# Patient Record
Sex: Male | Born: 1965
Health system: Southern US, Community
[De-identification: ages and names within clinical notes are randomized; demographics above are authoritative.]

## PROBLEM LIST (undated history)

## (undated) DIAGNOSIS — G8929 Other chronic pain: Secondary | ICD-10-CM

## (undated) DIAGNOSIS — K219 Gastro-esophageal reflux disease without esophagitis: Secondary | ICD-10-CM

## (undated) DIAGNOSIS — M549 Dorsalgia, unspecified: Secondary | ICD-10-CM

---

## 2002-04-17 ENCOUNTER — Ambulatory Visit (HOSPITAL_COMMUNITY): Admission: RE | Admit: 2002-04-17 | Discharge: 2002-04-17 | Payer: Self-pay | Admitting: General Surgery

## 2003-04-11 HISTORY — PX: ESOPHAGOGASTRODUODENOSCOPY: SHX1529

## 2012-09-08 ENCOUNTER — Encounter (HOSPITAL_COMMUNITY): Payer: Self-pay | Admitting: Emergency Medicine

## 2012-09-08 ENCOUNTER — Emergency Department (HOSPITAL_COMMUNITY)
Admission: EM | Admit: 2012-09-08 | Discharge: 2012-09-08 | Disposition: A | Discharge status: Home / Self Care | Payer: BC Managed Care – PPO | Source: Home / Self Care

## 2012-09-08 DIAGNOSIS — J309 Allergic rhinitis, unspecified: Secondary | ICD-10-CM

## 2012-09-08 DIAGNOSIS — M722 Plantar fascial fibromatosis: Secondary | ICD-10-CM

## 2012-09-08 HISTORY — DX: Other chronic pain: G89.29

## 2012-09-08 HISTORY — DX: Gastro-esophageal reflux disease without esophagitis: K21.9

## 2012-09-08 HISTORY — DX: Dorsalgia, unspecified: M54.9

## 2012-09-08 MED ORDER — METHYLPREDNISOLONE 4 MG PO KIT: PACK | ORAL | Status: DC

## 2012-09-08 NOTE — ED Notes (Signed)
Multiple complaints: primary complaint is bilateral foot pain that started approximately one month ago.  Reports pain in joints.  No new activity/work/shoes. Also, patient concerned for congestion.  Head and chest congestion for one week.

## 2012-09-08 NOTE — ED Provider Notes (Signed)
History     CSN: [id number]  Arrival date & time 09/08/12  1621   First MD Initiated Contact with Patient 09/08/12 1658      Chief Complaint  Patient presents with  . Foot Pain  . Nasal Congestion    (Consider location/radiation/quality/duration/timing/severity/associated sxs/prior treatment) HPI Comments: 47 year old employee of [employer] states he works 12 hours a day. For the past month he has been having pain in the plantar aspect of his feet. Also complaining of nasal congestion. Denies fever or chills. Denies any known injury.   Past Medical History  Diagnosis Date  . Acid reflux   . Chronic back pain     History reviewed. No pertinent past surgical history.  No family history on file.  History  Substance Use Topics  . Smoking status: Never Smoker   . Smokeless tobacco: Not on file  . Alcohol Use: Yes      Review of Systems  Constitutional: Negative.   HENT: Positive for congestion and postnasal drip. Negative for sore throat.   Eyes: Negative.   Respiratory: Negative.   Cardiovascular: Positive for chest pain.  Gastrointestinal: Negative.   Skin: Negative.   Neurological: Negative.     Allergies  Review of patient's allergies indicates no known allergies.  Home Medications   Current Outpatient Rx  Name  Route  Sig  Dispense  Refill  . methylPREDNISolone (MEDROL DOSEPAK) 4 MG tablet      follow package directions   21 tablet   0     BP 124/88  Pulse 101  Temp(Src) 98.8 F (37.1 C) (Oral)  Resp 19  SpO2 94%  Physical Exam  Nursing note and vitals reviewed. Constitutional: He is oriented to person, place, and time. He appears well-developed and well-nourished. No distress.  Neck: Normal range of motion. Neck supple.  Cardiovascular: Normal rate.   Pulmonary/Chest: Effort normal.  Musculoskeletal: Normal range of motion. He exhibits tenderness. He exhibits no edema.  Bilateral feet exam reveals normal architecture. No swelling,  deformity, or discoloration. Tenderness to the plantar aspect of each foot and heel and forefoot at the primary weight bearing areas.  Neurological: He is alert and oriented to person, place, and time. He exhibits normal muscle tone.  Skin: Skin is warm and dry.  Psychiatric: He has a normal mood and affect.    ED Course  Procedures (including critical care time)  Labs Reviewed - No data to display No results found.   1. Plantar fasciitis, bilateral   2. Allergic rhinitis due to allergen       MDM  Medrol dose pack Full length arch supports Comfortable shoes Roll feet over a cold can, cold compresses often Follow with the podiatrist if not improving in 1-2 weeks oBTAIN otc aLLEGRA OR cLARITIN  As directed        Hayden Rasmussen, NP 09/08/12 1824

## 2012-09-10 NOTE — ED Provider Notes (Signed)
Medical screening examination/treatment/procedure(s) were performed by resident physician or non-physician practitioner and as supervising physician I was immediately available for consultation/collaboration.   Barkley Bruns MD.   Linna Hoff, MD 09/10/12 1352

## 2013-10-26 ENCOUNTER — Emergency Department (HOSPITAL_COMMUNITY)
Admission: EM | Admit: 2013-10-26 | Discharge: 2013-10-26 | Disposition: A | Discharge status: Home / Self Care | Payer: BC Managed Care – PPO | Attending: Emergency Medicine | Admitting: Emergency Medicine

## 2013-10-26 ENCOUNTER — Emergency Department (HOSPITAL_COMMUNITY): Payer: BC Managed Care – PPO

## 2013-10-26 ENCOUNTER — Encounter (HOSPITAL_COMMUNITY): Payer: Self-pay | Admitting: Emergency Medicine

## 2013-10-26 DIAGNOSIS — Z79899 Other long term (current) drug therapy: Secondary | ICD-10-CM | POA: Insufficient documentation

## 2013-10-26 DIAGNOSIS — G8929 Other chronic pain: Secondary | ICD-10-CM | POA: Insufficient documentation

## 2013-10-26 DIAGNOSIS — R000 Tachycardia, unspecified: Secondary | ICD-10-CM | POA: Insufficient documentation

## 2013-10-26 DIAGNOSIS — K5712 Diverticulitis of small intestine without perforation or abscess without bleeding: Secondary | ICD-10-CM | POA: Insufficient documentation

## 2013-10-26 DIAGNOSIS — M549 Dorsalgia, unspecified: Secondary | ICD-10-CM | POA: Insufficient documentation

## 2013-10-26 DIAGNOSIS — K219 Gastro-esophageal reflux disease without esophagitis: Secondary | ICD-10-CM | POA: Insufficient documentation

## 2013-10-26 LAB — URINALYSIS, ROUTINE W REFLEX MICROSCOPIC
BILIRUBIN URINE: NEGATIVE
Glucose, UA: NEGATIVE mg/dL
Hgb urine dipstick: NEGATIVE
KETONES UR: NEGATIVE mg/dL
LEUKOCYTES UA: NEGATIVE
NITRITE: NEGATIVE
PROTEIN: NEGATIVE mg/dL
Specific Gravity, Urine: 1.01 (ref 1.005–1.030)
UROBILINOGEN UA: 0.2 mg/dL (ref 0.0–1.0)
pH: 7 (ref 5.0–8.0)

## 2013-10-26 LAB — COMPREHENSIVE METABOLIC PANEL
ALBUMIN: 4.1 g/dL (ref 3.5–5.2)
ALK PHOS: 40 U/L (ref 39–117)
ALT: 29 U/L (ref 0–53)
ANION GAP: 14 (ref 5–15)
AST: 21 U/L (ref 0–37)
BUN: 17 mg/dL (ref 6–23)
CO2: 22 mEq/L (ref 19–32)
CREATININE: 1.22 mg/dL (ref 0.50–1.35)
Calcium: 9.5 mg/dL (ref 8.4–10.5)
Chloride: 102 mEq/L (ref 96–112)
GFR calc Af Amer: 81 mL/min — ABNORMAL LOW (ref >90)
GFR calc non Af Amer: 70 mL/min — ABNORMAL LOW (ref >90)
Glucose, Bld: 122 mg/dL — ABNORMAL HIGH (ref 70–99)
POTASSIUM: 4.1 meq/L (ref 3.7–5.3)
Sodium: 138 mEq/L (ref 137–147)
TOTAL PROTEIN: 7.8 g/dL (ref 6.0–8.3)
Total Bilirubin: 0.5 mg/dL (ref 0.3–1.2)

## 2013-10-26 LAB — CBC WITH DIFFERENTIAL/PLATELET
BASOS ABS: 0.1 10*3/uL (ref 0.0–0.1)
BASOS PCT: 0 % (ref 0–1)
EOS ABS: 0.2 10*3/uL (ref 0.0–0.7)
Eosinophils Relative: 1 % (ref 0–5)
HCT: 43.6 % (ref 39.0–52.0)
HEMOGLOBIN: 14.7 g/dL (ref 13.0–17.0)
Lymphocytes Relative: 15 % (ref 12–46)
Lymphs Abs: 2.1 10*3/uL (ref 0.7–4.0)
MCH: 28.5 pg (ref 26.0–34.0)
MCHC: 33.7 g/dL (ref 30.0–36.0)
MCV: 84.7 fL (ref 78.0–100.0)
MONOS PCT: 11 % (ref 3–12)
Monocytes Absolute: 1.5 10*3/uL — ABNORMAL HIGH (ref 0.1–1.0)
NEUTROS ABS: 10 10*3/uL — AB (ref 1.7–7.7)
NEUTROS PCT: 73 % (ref 43–77)
Platelets: 209 10*3/uL (ref 150–400)
RBC: 5.15 MIL/uL (ref 4.22–5.81)
RDW: 14.1 % (ref 11.5–15.5)
WBC: 13.9 10*3/uL — ABNORMAL HIGH (ref 4.0–10.5)

## 2013-10-26 LAB — LIPASE, BLOOD: LIPASE: 36 U/L (ref 11–59)

## 2013-10-26 MED ORDER — SODIUM CHLORIDE 0.9 % IV BOLUS (SEPSIS)
1000.0000 mL | Freq: Once | INTRAVENOUS | Status: AC
  Administered 2013-10-26: 1000 mL via INTRAVENOUS

## 2013-10-26 MED ORDER — HYDROMORPHONE HCL PF 1 MG/ML IJ SOLN
1.0000 mg | Freq: Once | INTRAMUSCULAR | Status: AC
  Administered 2013-10-26: 1 mg via INTRAVENOUS
  Filled 2013-10-26: qty 1

## 2013-10-26 MED ORDER — ONDANSETRON HCL 4 MG/2ML IJ SOLN
4.0000 mg | Freq: Once | INTRAMUSCULAR | Status: AC
  Administered 2013-10-26: 4 mg via INTRAVENOUS
  Filled 2013-10-26: qty 2

## 2013-10-26 MED ORDER — IOHEXOL 300 MG/ML  SOLN
100.0000 mL | Freq: Once | INTRAMUSCULAR | Status: AC | PRN
  Administered 2013-10-26: 100 mL via INTRAVENOUS

## 2013-10-26 MED ORDER — SODIUM CHLORIDE 0.9 % IV SOLN
INTRAVENOUS | Status: DC
  Administered 2013-10-26: 100 mL/h via INTRAVENOUS

## 2013-10-26 MED ORDER — SODIUM CHLORIDE 0.9 % IV SOLN
3.0000 g | Freq: Once | INTRAVENOUS | Status: AC
Start: 2013-10-26 — End: 2013-10-26
  Administered 2013-10-26: 3 g via INTRAVENOUS
  Filled 2013-10-26: qty 3

## 2013-10-26 MED ORDER — AMOXICILLIN-POT CLAVULANATE 875-125 MG PO TABS: 1.0000 | ORAL_TABLET | Freq: Two times a day (BID) | ORAL | Status: DC

## 2013-10-26 MED ORDER — IOHEXOL 300 MG/ML  SOLN
50.0000 mL | Freq: Once | INTRAMUSCULAR | Status: AC | PRN
  Administered 2013-10-26: 50 mL via ORAL

## 2013-10-26 MED ORDER — HYDROCODONE-ACETAMINOPHEN 5-325 MG PO TABS
1.0000 | ORAL_TABLET | Freq: Four times a day (QID) | ORAL | Status: DC | PRN
Start: 2013-10-26 — End: 2013-11-12

## 2013-10-26 MED ORDER — ONDANSETRON 4 MG PO TBDP: 4.0000 mg | ORAL_TABLET | Freq: Three times a day (TID) | ORAL | Status: DC | PRN

## 2013-10-26 NOTE — ED Notes (Signed)
complain of pain in right lower abdomen that goes into his back

## 2013-10-26 NOTE — ED Provider Notes (Signed)
CSN: [id number]     Arrival date & time 10/26/13  1536 History   First MD Initiated Contact with Patient 10/26/13 1554     Chief Complaint  Patient presents with  . Abdominal Pain     (Consider location/radiation/quality/duration/timing/severity/associated sxs/prior Treatment) Patient is a 48 y.o. male presenting with abdominal pain. The history is provided by the patient.  Abdominal Pain Pain location:  LLQ and RLQ Associated symptoms: fever, nausea and vomiting   Associated symptoms: no chest pain, no diarrhea, no dysuria, no hematuria and no shortness of breath    patient with onset of bilateral lower quadrant abdominal pain about 9:30 this morning. Radiating to the back. Pain is intermittent but does have prolonged periods of pain. 8/10. Associated with nausea and vomiting no blood in the vomit no diarrhea no dysuria. No history of similar symptoms. Patient felt fine earlier. Patient felt as if he may have a fever as well.  Past Medical History  Diagnosis Date  . Acid reflux   . Chronic back pain    History reviewed. No pertinent past surgical history. No family history on file. History  Substance Use Topics  . Smoking status: Never Smoker   . Smokeless tobacco: Not on file  . Alcohol Use: Yes    Review of Systems  Constitutional: Positive for fever.  HENT: Negative for congestion.   Eyes: Negative for visual disturbance.  Respiratory: Negative for shortness of breath.   Cardiovascular: Negative for chest pain.  Gastrointestinal: Positive for nausea, vomiting and abdominal pain. Negative for diarrhea.  Genitourinary: Negative for dysuria and hematuria.  Musculoskeletal: Positive for back pain.  Skin: Negative for rash.  Neurological: Negative for headaches.  Hematological: Does not bruise/bleed easily.  Psychiatric/Behavioral: Negative for confusion.      Allergies  Review of patient's allergies indicates no known allergies.  Home Medications   Prior to  Admission medications   Medication Sig Start Date End Date Taking? Authorizing Provider  omeprazole (PRILOSEC OTC) 20 MG tablet Take 20 mg by mouth daily.   Yes Historical Provider, MD  topiramate (TOPAMAX) 100 MG tablet Take 100 mg by mouth daily. 09/16/13  Yes Historical Provider, MD  amoxicillin-clavulanate (AUGMENTIN) 875-125 MG per tablet Take 1 tablet by mouth 2 (two) times daily. 10/26/13   Fredia Sorrow, MD  HYDROcodone-acetaminophen (NORCO/VICODIN) 5-325 MG per tablet Take 1-2 tablets by mouth every 6 (six) hours as needed. 10/26/13   Fredia Sorrow, MD  ondansetron (ZOFRAN ODT) 4 MG disintegrating tablet Take 1 tablet (4 mg total) by mouth every 8 (eight) hours as needed. 10/26/13   Fredia Sorrow, MD   BP 108/84  Pulse 110  Temp(Src) 99.3 F (37.4 C) (Oral)  Resp 16  Ht 5\' 9"  (1.753 m)  Wt 180 lb (81.647 kg)  BMI 26.57 kg/m2  SpO2 96% Physical Exam  Nursing note and vitals reviewed. Constitutional: He is oriented to person, place, and time. He appears well-developed and well-nourished. No distress.  HENT:  Head: Normocephalic and atraumatic.  Mouth/Throat: Oropharynx is clear and moist.  Eyes: Conjunctivae and EOM are normal. Pupils are equal, round, and reactive to light.  Neck: Normal range of motion.  Cardiovascular: Regular rhythm.   No murmur heard. Tachycardia  Pulmonary/Chest: Effort normal and breath sounds normal. No respiratory distress.  Abdominal: Soft. Bowel sounds are normal. There is tenderness.  Mild tenderness lower quadrants no guarding.  Musculoskeletal: Normal range of motion. He exhibits no edema.  Neurological: He is alert and oriented to person, place,  and time. No cranial nerve deficit. He exhibits normal muscle tone. Coordination normal.  Skin: Skin is warm. No rash noted.    ED Course  Procedures (including critical care time) Labs Review Labs Reviewed  CBC WITH DIFFERENTIAL - Abnormal; Notable for the following:    WBC 13.9 (*)    Neutro  Abs 10.0 (*)    Monocytes Absolute 1.5 (*)    All other components within normal limits  COMPREHENSIVE METABOLIC PANEL - Abnormal; Notable for the following:    Glucose, Bld 122 (*)    GFR calc non Af Amer 70 (*)    GFR calc Af Amer 81 (*)    All other components within normal limits  URINALYSIS, ROUTINE W REFLEX MICROSCOPIC  LIPASE, BLOOD   Results for orders placed during the hospital encounter of 10/26/13  URINALYSIS, ROUTINE W REFLEX MICROSCOPIC      Result Value Ref Range   Color, Urine YELLOW  YELLOW   APPearance CLEAR  CLEAR   Specific Gravity, Urine 1.010  1.005 - 1.030   pH 7.0  5.0 - 8.0   Glucose, UA NEGATIVE  NEGATIVE mg/dL   Hgb urine dipstick NEGATIVE  NEGATIVE   Bilirubin Urine NEGATIVE  NEGATIVE   Ketones, ur NEGATIVE  NEGATIVE mg/dL   Protein, ur NEGATIVE  NEGATIVE mg/dL   Urobilinogen, UA 0.2  0.0 - 1.0 mg/dL   Nitrite NEGATIVE  NEGATIVE   Leukocytes, UA NEGATIVE  NEGATIVE  CBC WITH DIFFERENTIAL      Result Value Ref Range   WBC 13.9 (*) 4.0 - 10.5 K/uL   RBC 5.15  4.22 - 5.81 MIL/uL   Hemoglobin 14.7  13.0 - 17.0 g/dL   HCT 43.6  39.0 - 52.0 %   MCV 84.7  78.0 - 100.0 fL   MCH 28.5  26.0 - 34.0 pg   MCHC 33.7  30.0 - 36.0 g/dL   RDW 14.1  11.5 - 15.5 %   Platelets 209  150 - 400 K/uL   Neutrophils Relative % 73  43 - 77 %   Neutro Abs 10.0 (*) 1.7 - 7.7 K/uL   Lymphocytes Relative 15  12 - 46 %   Lymphs Abs 2.1  0.7 - 4.0 K/uL   Monocytes Relative 11  3 - 12 %   Monocytes Absolute 1.5 (*) 0.1 - 1.0 K/uL   Eosinophils Relative 1  0 - 5 %   Eosinophils Absolute 0.2  0.0 - 0.7 K/uL   Basophils Relative 0  0 - 1 %   Basophils Absolute 0.1  0.0 - 0.1 K/uL  COMPREHENSIVE METABOLIC PANEL      Result Value Ref Range   Sodium 138  137 - 147 mEq/L   Potassium 4.1  3.7 - 5.3 mEq/L   Chloride 102  96 - 112 mEq/L   CO2 22  19 - 32 mEq/L   Glucose, Bld 122 (*) 70 - 99 mg/dL   BUN 17  6 - 23 mg/dL   Creatinine, Ser 1.22  0.50 - 1.35 mg/dL   Calcium 9.5  8.4  - 10.5 mg/dL   Total Protein 7.8  6.0 - 8.3 g/dL   Albumin 4.1  3.5 - 5.2 g/dL   AST 21  0 - 37 U/L   ALT 29  0 - 53 U/L   Alkaline Phosphatase 40  39 - 117 U/L   Total Bilirubin 0.5  0.3 - 1.2 mg/dL   GFR calc non Af Amer 70 (*) >90  mL/min   GFR calc Af Amer 81 (*) >90 mL/min   Anion gap 14  5 - 15  LIPASE, BLOOD      Result Value Ref Range   Lipase 36  11 - 59 U/L     Imaging Review Ct Abdomen Pelvis W Contrast  10/26/2013   CLINICAL DATA:  One day history of right lower quadrant abdominal pain.  EXAM: CT ABDOMEN AND PELVIS WITH CONTRAST  TECHNIQUE: Multidetector CT imaging of the abdomen and pelvis was performed using the standard protocol following bolus administration of intravenous contrast.  CONTRAST:  143mL OMNIPAQUE IOHEXOL 300 MG/ML IV. Oral contrast was also administered.  COMPARISON:  None.  FINDINGS: Diverticulosis involving the sigmoid colon. Wall thickening involving a segment of the proximal sigmoid colon with edema/inflammation in the adjacent fat. Possible very small intramural abscess in the anterior wall of the involved segment. No evidence of abscess outside the colon. No extraluminal gas. Remainder of the colon normal in appearance. Normal appendix in the right mid pelvis. Minimal pelvic ascites.  Normal-appearing stomach filled with oral contrast. Normal appearing small bowel.  Cholelithiasis, the largest gallstone approximating 2 cm. No CT evidence of acute cholecystitis. No biliary ductal dilation. Normal appearing liver, spleen, pancreas, adrenal glands, and kidneys. No visible aortoiliofemoral atherosclerosis. No significant lymphadenopathy.  Urinary bladder unremarkable. Prostate gland and seminal vesicles normal for age.  Bone window images demonstrate degenerative disc disease at T11-12, Schmorl's nodes involving the T12, L1, L2 and L3 vertebral bodies. Visualized lung bases clear. Heart size normal.  IMPRESSION: 1. Acute diverticulitis involving the proximal sigmoid  colon. No evidence of abscess (apart from a possible very small intramural abscess). No evidence of perforation. 2. Cholelithiasis without CT evidence of acute cholecystitis.   Electronically Signed   By: Evangeline Dakin M.D.   On: 10/26/2013 18:41     EKG Interpretation   Date/Time:  Sunday October 26 2013 16:36:06 EDT Ventricular Rate:  108 PR Interval:  131 QRS Duration: 86 QT Interval:  329 QTC Calculation: 441 R Axis:   5 Text Interpretation:  Sinus tachycardia Abnormal R-wave progression, early  transition Borderline T abnormalities, anterior leads No previous ECGs  available Confirmed by Daleysa Kristiansen  MD, Laelle Bridgett [phone]) on 10/26/2013 4:44:48  PM      MDM   Final diagnoses:  Diverticulitis of small intestine without perforation or abscess without bleeding    CT scan consistent with sigmoid diverticulitis. Patient treated with Unasyn here IV. No complicating factors. Perhaps a small abscess. Patient's abdomen without significant tenderness. Low-grade fever noted. Heart rate still a little elevated but improved with fluids and pain medicine here. Patient should be able to be discharged home with treatment at home with Augmentin. Patient will return for any newer worse symptoms. Recommend occur liquid diet for 24 hours and then bland diet. Work note provided.    Fredia Sorrow, MD 10/26/13 2035

## 2013-10-26 NOTE — Discharge Instructions (Signed)
Diverticulitis Diverticulitis is when small pockets that have formed in your colon (large intestine) become infected or swollen. HOME CARE  Follow your doctor's instructions.  Follow a special diet if told by your doctor.  When you feel better, your doctor may tell you to change your diet. You may be told to eat a lot of fiber. Fruits and vegetables are good sources of fiber. Fiber makes it easier to poop (have bowel movements).  Take supplements or probiotics as told by your doctor.  Only take medicines as told by your doctor.  Keep all follow-up visits with your doctor. GET HELP IF:  Your pain does not get better.  You have a hard time eating food.  You are not pooping like normal. GET HELP RIGHT AWAY IF:  Your pain gets worse.  Your problems do not get better.  Your problems suddenly get worse.  You have a fever.  You keep throwing up (vomiting).  You have bloody or black, tarry poop (stool). MAKE SURE YOU:   Understand these instructions.  Will watch your condition.  Will get help right away if you are not doing well or get worse. Document Released: 09/13/2007 Document Revised: 04/01/2013 Document Reviewed: 02/19/2013 [facility] Patient Information 2015 [place], [place]. This information is not intended to replace advice given to you by your health care provider. Make sure you discuss any questions you have with your health care provider.  Recommend clear liquid diet for the next 24 hours then bland diet. Take antibiotic as directed. Take pain medicine as needed. Take antinausea medicine as needed. You should be improving in 2 days. If not is important that she get seen again. Also return for any new or worse symptoms at all. Make an appointment to followup with your regular Dr. in the next week or 2. Work note provided.

## 2013-11-12 ENCOUNTER — Other Ambulatory Visit (HOSPITAL_COMMUNITY): Payer: Self-pay | Admitting: General Surgery

## 2013-11-12 ENCOUNTER — Encounter (HOSPITAL_COMMUNITY): Payer: Self-pay | Admitting: Pharmacy Technician

## 2013-11-12 DIAGNOSIS — K8018 Calculus of gallbladder with other cholecystitis without obstruction: Secondary | ICD-10-CM

## 2013-11-12 NOTE — Consult Note (Signed)
NAME:  Curtis Trevino, Curtis Trevino NO.:  [id number]  MEDICAL RECORD NO.:  [id number]  LOCATION:  [facility]                         FACILITY:  [facility]  PHYSICIAN:  Felicie Morn, M.D. DATE OF BIRTH:  Feb 09, 1966  DATE OF CONSULTATION: DATE OF DISCHARGE:  10/26/2013                                CONSULTATION   NOTE:  Surgery was asked to see this 48 year old white male for cholelithiasis.  Past medical history has included recurrent right upper quadrant pain radiating to his back which has been postprandial in nature and accompanied with nausea, but no vomiting for at least a year, his last flare-up was 6 months ago.  In the interim, however, 3 or 4 weeks ago, he did have bout of diverticulitis at which time, he was seen in the emergency room, this has been treated with antibiotics and he has no further left lower quadrant pain or any lower abdominal discomfort and he is tolerating p.o. well.  At present, he does not have any right upper quadrant pain and this is subsided since his last episode.  As the CT scan that he had showed multiple stones, he was self-referred to my office from Dr. Delanna Ahmadi office for cholecystectomy.  I advised him against a colonoscopy right away, but told him of the importance of obtaining that later and he will make his arrangements for that.  PAST MEDICAL HISTORY:  Positive for migraines and reflux.  PAST SURGICAL HISTORY:  He has had no previous surgery.  ALLERGIES:  He has some intolerance to TYLENOL which causes him to vomit, and CODEINE which gives him pruritus.  MEDICATIONS:  Consult medication list.  PHYSICAL EXAMINATION:  GENERAL:  He is in no acute distress. VITAL SIGNS:  He is 5 feet 8.5 inches tall, weighs 194 pounds, temperature is 98.1, pulse is 68, respirations 12, blood pressure 100/60. HEENT:  Head is normocephalic.  Eyes, extraocular movements are intact. Pupils are round and reactive to light and accommodation.  The  patient uses corrective lenses. NECK:  He has no bruits.  No jugular vein distention.  No thyromegaly or cervical adenopathy. CHEST:  Clear, both the anterior and posterior auscultation.  The patient no longer smokes, and he does not drink. HEART:  Regular. ABDOMEN:  Soft.  He has some slight residual tenderness in the right upper quadrant, however, certainly no rebound or guarding. RECTAL:  This prostate is somewhat enlarged, but smooth.  The stool was guaiac negative.  He has no femoral or inguinal hernias. EXTREMITIES:  Within normal limits without clubbing, cyanosis, or varicosities.  REVIEW OF SYSTEMS:  Past history of migraines, no history of seizures, no lateralizing neurological findings.  ENDOCRINE SYSTEM:  No history of diabetes, thyroid disease, or adrenal problems.  CARDIOPULMONARY SYSTEM: Within normal limits.  The patient has stopped smoking. MUSCULOSKELETAL:  Within normal limits.  GI SYSTEM:  History of reflux, no past history of hepatitis, constipation, diarrhea, bright red rectal bleeding, or melena.  No history of inflammatory bowel disease or irritable bowel syndrome.  No unexplained weight loss.  The patient has never had a colonoscopy.  He has had a history of diverticulosis with flare-ups of diverticulitis from time to time.  GU  SYSTEM:  No history of kidney stones, dysuria, or frequency.  Labs and CT scan have been reviewed; LFS are grossly within normal limits without and elevation in his bilirubin levels. Will obtain a sonogram to better evaluate the gall bladder and biliary tree.    REVIEW OF HISTORY AND PHYSICAL:  Therefore, Curtis Trevino is a 48 year old white male who will undergo laparoscopic cholecystectomy.  He has been placed on a restrictive diet and we will plan to do this surgery at his convenience next week.  He is told to contact me should he have any problems in the interim.  We discussed complications not limited to, but including  bleeding, infection, damage to bile ducts, perforation of organs, transitory diarrhea, and the possibility that open surgery might be required. Informed consent was obtained with him and his friend.     Felicie Morn, M.D.     WB/MEDQ  D:  11/12/2013  T:  11/12/2013  Job:  [id number]  cc:   Dr. Hilma Favors

## 2013-11-14 ENCOUNTER — Ambulatory Visit (HOSPITAL_COMMUNITY)
Admission: RE | Admit: 2013-11-14 | Discharge: 2013-11-14 | Disposition: A | Discharge status: Home / Self Care | Payer: BC Managed Care – PPO | Source: Ambulatory Visit | Attending: General Surgery | Admitting: General Surgery

## 2013-11-14 ENCOUNTER — Encounter (HOSPITAL_COMMUNITY)
Admission: RE | Admit: 2013-11-14 | Discharge: 2013-11-14 | Disposition: A | Discharge status: Home / Self Care | Payer: BC Managed Care – PPO | Source: Ambulatory Visit | Attending: General Surgery | Admitting: General Surgery

## 2013-11-14 ENCOUNTER — Encounter (HOSPITAL_COMMUNITY): Payer: Self-pay

## 2013-11-14 DIAGNOSIS — Z01818 Encounter for other preprocedural examination: Secondary | ICD-10-CM | POA: Insufficient documentation

## 2013-11-14 DIAGNOSIS — K7689 Other specified diseases of liver: Secondary | ICD-10-CM | POA: Insufficient documentation

## 2013-11-14 DIAGNOSIS — Z01812 Encounter for preprocedural laboratory examination: Secondary | ICD-10-CM | POA: Insufficient documentation

## 2013-11-14 DIAGNOSIS — K8018 Calculus of gallbladder with other cholecystitis without obstruction: Secondary | ICD-10-CM

## 2013-11-14 DIAGNOSIS — K8010 Calculus of gallbladder with chronic cholecystitis without obstruction: Secondary | ICD-10-CM | POA: Insufficient documentation

## 2013-11-14 LAB — BASIC METABOLIC PANEL
Anion gap: 11 (ref 5–15)
BUN: 11 mg/dL (ref 6–23)
CHLORIDE: 106 meq/L (ref 96–112)
CO2: 26 meq/L (ref 19–32)
Calcium: 10.1 mg/dL (ref 8.4–10.5)
Creatinine, Ser: 1.26 mg/dL (ref 0.50–1.35)
GFR calc Af Amer: 78 mL/min — ABNORMAL LOW (ref >90)
GFR calc non Af Amer: 67 mL/min — ABNORMAL LOW (ref >90)
GLUCOSE: 107 mg/dL — AB (ref 70–99)
POTASSIUM: 4.5 meq/L (ref 3.7–5.3)
Sodium: 143 mEq/L (ref 137–147)

## 2013-11-14 LAB — CBC WITH DIFFERENTIAL/PLATELET
Basophils Absolute: 0.1 10*3/uL (ref 0.0–0.1)
Basophils Relative: 1 % (ref 0–1)
Eosinophils Absolute: 0.1 10*3/uL (ref 0.0–0.7)
Eosinophils Relative: 1 % (ref 0–5)
HEMATOCRIT: 43.9 % (ref 39.0–52.0)
HEMOGLOBIN: 14.7 g/dL (ref 13.0–17.0)
LYMPHS ABS: 2.3 10*3/uL (ref 0.7–4.0)
Lymphocytes Relative: 38 % (ref 12–46)
MCH: 28.8 pg (ref 26.0–34.0)
MCHC: 33.5 g/dL (ref 30.0–36.0)
MCV: 86.1 fL (ref 78.0–100.0)
MONOS PCT: 7 % (ref 3–12)
Monocytes Absolute: 0.4 10*3/uL (ref 0.1–1.0)
NEUTROS ABS: 3.2 10*3/uL (ref 1.7–7.7)
NEUTROS PCT: 53 % (ref 43–77)
Platelets: 239 10*3/uL (ref 150–400)
RBC: 5.1 MIL/uL (ref 4.22–5.81)
RDW: 14.1 % (ref 11.5–15.5)
WBC: 6 10*3/uL (ref 4.0–10.5)

## 2013-11-14 NOTE — Patient Instructions (Signed)
Curtis Trevino  11/14/2013   Your procedure is scheduled on:  11/18/2013  Report to [facility] at 6:15 AM.  Call this number if you have problems the morning of surgery: [phone]   Remember:   Do not eat food or drink liquids after midnight.   Take these medicines the morning of surgery with A SIP OF WATER: Topamax and Prilosec   Do not wear jewelry, make-up or nail polish.  Do not wear lotions, powders, or perfumes. You may wear deodorant.  Do not shave 48 hours prior to surgery. Men may shave face and neck.  Do not bring valuables to the hospital.  [facility] is not responsible for any belongings or valuables.               Contacts, dentures or bridgework may not be worn into surgery.  Leave suitcase in the car. After surgery it may be brought to your room.  For patients admitted to the hospital, discharge time is determined by your treatment team.               Patients discharged the day of surgery will not be allowed to drive home.  Name and phone number of your driver:   Special Instructions: Shower using CHG 2 nights before surgery and the night before surgery.  If you shower the day of surgery use CHG.  Use special wash - you have one bottle of CHG for all showers.  You should use approximately 1/3 of the bottle for each shower.   Please read over the following fact sheets that you were given: Surgical Site Infection Prevention and Anesthesia Post-op Instructions   PATIENT INSTRUCTIONS POST-ANESTHESIA  IMMEDIATELY FOLLOWING SURGERY:  Do not drive or operate machinery for the first twenty four hours after surgery.  Do not make any important decisions for twenty four hours after surgery or while taking narcotic pain medications or sedatives.  If you develop intractable nausea and vomiting or a severe headache please notify your doctor immediately.  FOLLOW-UP:  Please make an appointment with your surgeon as instructed. You do not need to follow up with anesthesia unless  specifically instructed to do so.  WOUND CARE INSTRUCTIONS (if applicable):  Keep a dry clean dressing on the anesthesia/puncture wound site if there is drainage.  Once the wound has quit draining you may leave it open to air.  Generally you should leave the bandage intact for twenty four hours unless there is drainage.  If the epidural site drains for more than 36-48 hours please call the anesthesia department.  QUESTIONS?:  Please feel free to call your physician or the hospital operator if you have any questions, and they will be happy to assist you.      Laparoscopic Cholecystectomy Laparoscopic cholecystectomy is surgery to remove the gallbladder. The gallbladder is located in the upper right part of the abdomen, behind the liver. It is a storage sac for bile produced in the liver. Bile aids in the digestion and absorption of fats. Cholecystectomy is often done for inflammation of the gallbladder (cholecystitis). This condition is usually caused by a buildup of gallstones (cholelithiasis) in your gallbladder. Gallstones can block the flow of bile, resulting in inflammation and pain. In severe cases, emergency surgery may be required. When emergency surgery is not required, you will have time to prepare for the procedure. Laparoscopic surgery is an alternative to open surgery. Laparoscopic surgery has a shorter recovery time. Your common bile duct may also need to  be examined during the procedure. If stones are found in the common bile duct, they may be removed. LET [facility] CARE PROVIDER KNOW ABOUT:  Any allergies you have.  All medicines you are taking, including vitamins, herbs, eye drops, creams, and over-the-counter medicines.  Previous problems you or members of your family have had with the use of anesthetics.  Any blood disorders you have.  Previous surgeries you have had.  Medical conditions you have. RISKS AND COMPLICATIONS Generally, this is a safe procedure. However, as  with any procedure, complications can occur. Possible complications include:  Infection.  Damage to the common bile duct, nerves, arteries, veins, or other internal organs such as the stomach, liver, or intestines.  Bleeding.  A stone may remain in the common bile duct.  A bile leak from the cyst duct that is clipped when your gallbladder is removed.  The need to convert to open surgery, which requires a larger incision in the abdomen. This may be necessary if your surgeon thinks it is not safe to continue with a laparoscopic procedure. BEFORE THE PROCEDURE  Ask your health care provider about changing or stopping any regular medicines. You will need to stop taking aspirin or blood thinners at least 5 days prior to surgery.  Do not eat or drink anything after midnight the night before surgery.  Let your health care provider know if you develop a cold or other infectious problem before surgery. PROCEDURE   You will be given medicine to make you sleep through the procedure (general anesthetic). A breathing tube will be placed in your mouth.  When you are asleep, your surgeon will make several small cuts (incisions) in your abdomen.  A thin, lighted tube with a tiny camera on the end (laparoscope) is inserted through one of the small incisions. The camera on the laparoscope sends a picture to a TV screen in the operating room. This gives the surgeon a good view inside your abdomen.  A gas will be pumped into your abdomen. This expands your abdomen so that the surgeon has more room to perform the surgery.  Other tools needed for the procedure are inserted through the other incisions. The gallbladder is removed through one of the incisions.  After the removal of your gallbladder, the incisions will be closed with stitches, staples, or skin glue. AFTER THE PROCEDURE  You will be taken to a recovery area where your progress will be checked often.  You may be allowed to go home the same  day if your pain is controlled and you can tolerate liquids. Document Released: 03/27/2005 Document Revised: 01/15/2013 Document Reviewed: 11/06/2012 [facility] Patient Information 2015 [place], [place]. This information is not intended to replace advice given to you by your health care provider. Make sure you discuss any questions you have with your health care provider.

## 2013-11-18 ENCOUNTER — Ambulatory Visit (HOSPITAL_COMMUNITY)
Admission: RE | Admit: 2013-11-18 | Discharge: 2013-11-19 | Disposition: A | Discharge status: Home / Self Care | Payer: BC Managed Care – PPO | Source: Ambulatory Visit | Attending: General Surgery | Admitting: General Surgery

## 2013-11-18 ENCOUNTER — Encounter (HOSPITAL_COMMUNITY)
Admission: RE | Disposition: A | Discharge status: Home / Self Care | Payer: Self-pay | Source: Ambulatory Visit | Attending: General Surgery

## 2013-11-18 ENCOUNTER — Encounter (HOSPITAL_COMMUNITY): Payer: Self-pay

## 2013-11-18 ENCOUNTER — Encounter (HOSPITAL_COMMUNITY): Payer: BC Managed Care – PPO | Admitting: Anesthesiology

## 2013-11-18 ENCOUNTER — Ambulatory Visit (HOSPITAL_COMMUNITY): Payer: BC Managed Care – PPO | Admitting: Anesthesiology

## 2013-11-18 ENCOUNTER — Ambulatory Visit (HOSPITAL_COMMUNITY): Payer: Self-pay | Admitting: General Surgery

## 2013-11-18 DIAGNOSIS — Z87891 Personal history of nicotine dependence: Secondary | ICD-10-CM | POA: Diagnosis not present

## 2013-11-18 DIAGNOSIS — K8010 Calculus of gallbladder with chronic cholecystitis without obstruction: Secondary | ICD-10-CM | POA: Insufficient documentation

## 2013-11-18 DIAGNOSIS — Z885 Allergy status to narcotic agent status: Secondary | ICD-10-CM | POA: Insufficient documentation

## 2013-11-18 DIAGNOSIS — K219 Gastro-esophageal reflux disease without esophagitis: Secondary | ICD-10-CM | POA: Diagnosis not present

## 2013-11-18 HISTORY — PX: CHOLECYSTECTOMY: SHX55

## 2013-11-18 SURGERY — LAPAROSCOPIC CHOLECYSTECTOMY
Anesthesia: General | Site: Abdomen

## 2013-11-18 MED ORDER — ONDANSETRON HCL 4 MG/2ML IJ SOLN: 4.0000 mg | Freq: Four times a day (QID) | INTRAMUSCULAR | Status: DC | PRN

## 2013-11-18 MED ORDER — SODIUM CHLORIDE 0.9 % IR SOLN
Status: DC | PRN
  Administered 2013-11-18: 500 mL

## 2013-11-18 MED ORDER — ONDANSETRON HCL 4 MG/2ML IJ SOLN
4.0000 mg | Freq: Once | INTRAMUSCULAR | Status: AC
  Administered 2013-11-18: 4 mg via INTRAVENOUS

## 2013-11-18 MED ORDER — WATER FOR IRRIGATION, STERILE IR SOLN
Status: DC | PRN
  Administered 2013-11-18: 2000

## 2013-11-18 MED ORDER — FENTANYL CITRATE 0.05 MG/ML IJ SOLN
INTRAMUSCULAR | Status: AC
  Filled 2013-11-18: qty 2

## 2013-11-18 MED ORDER — HYDROMORPHONE HCL PF 1 MG/ML IJ SOLN
1.0000 mg | INTRAMUSCULAR | Status: DC | PRN
  Administered 2013-11-18 – 2013-11-19 (×5): 1 mg via INTRAVENOUS
  Filled 2013-11-18 (×4): qty 1

## 2013-11-18 MED ORDER — BUPIVACAINE HCL (PF) 0.5 % IJ SOLN
INTRAMUSCULAR | Status: AC
  Filled 2013-11-18: qty 30

## 2013-11-18 MED ORDER — DOCUSATE SODIUM 100 MG PO CAPS
100.0000 mg | ORAL_CAPSULE | Freq: Every day | ORAL | Status: DC
  Administered 2013-11-18 – 2013-11-19 (×2): 100 mg via ORAL
  Filled 2013-11-18 (×2): qty 1

## 2013-11-18 MED ORDER — ONDANSETRON HCL 4 MG/2ML IJ SOLN: 4.0000 mg | Freq: Once | INTRAMUSCULAR | Status: DC | PRN

## 2013-11-18 MED ORDER — HYDROMORPHONE HCL PF 1 MG/ML IJ SOLN
INTRAMUSCULAR | Status: AC
  Filled 2013-11-18: qty 1

## 2013-11-18 MED ORDER — PROPOFOL 10 MG/ML IV BOLUS
INTRAVENOUS | Status: DC | PRN
  Administered 2013-11-18: 170 mg via INTRAVENOUS
  Administered 2013-11-18: 30 mg via INTRAVENOUS

## 2013-11-18 MED ORDER — HYDROMORPHONE HCL PF 1 MG/ML IJ SOLN
INTRAMUSCULAR | Status: AC
  Administered 2013-11-18: 1 mg via INTRAVENOUS
  Filled 2013-11-18: qty 1

## 2013-11-18 MED ORDER — SODIUM CHLORIDE 0.9 % IR SOLN
Status: DC | PRN
  Administered 2013-11-18: 3000 mL

## 2013-11-18 MED ORDER — ROCURONIUM BROMIDE 50 MG/5ML IV SOLN
INTRAVENOUS | Status: AC
  Filled 2013-11-18: qty 1

## 2013-11-18 MED ORDER — NEOSTIGMINE METHYLSULFATE 10 MG/10ML IV SOLN
INTRAVENOUS | Status: AC
  Filled 2013-11-18: qty 1

## 2013-11-18 MED ORDER — GLYCOPYRROLATE 0.2 MG/ML IJ SOLN
INTRAMUSCULAR | Status: DC | PRN
  Administered 2013-11-18: 0.6 mg via INTRAVENOUS

## 2013-11-18 MED ORDER — BUPIVACAINE HCL (PF) 0.5 % IJ SOLN
INTRAMUSCULAR | Status: DC | PRN
  Administered 2013-11-18: 8.5 mL

## 2013-11-18 MED ORDER — ONDANSETRON HCL 4 MG/2ML IJ SOLN
INTRAMUSCULAR | Status: AC
  Filled 2013-11-18: qty 2

## 2013-11-18 MED ORDER — GLYCOPYRROLATE 0.2 MG/ML IJ SOLN
INTRAMUSCULAR | Status: AC
  Filled 2013-11-18: qty 3

## 2013-11-18 MED ORDER — MIDAZOLAM HCL 2 MG/2ML IJ SOLN
1.0000 mg | INTRAMUSCULAR | Status: DC | PRN
  Administered 2013-11-18 (×2): 2 mg via INTRAVENOUS
  Filled 2013-11-18: qty 2

## 2013-11-18 MED ORDER — DEXTROSE 5 % IV SOLN
INTRAVENOUS | Status: AC
  Filled 2013-11-18: qty 10

## 2013-11-18 MED ORDER — POTASSIUM CHLORIDE IN NACL 20-0.9 MEQ/L-% IV SOLN
INTRAVENOUS | Status: DC
  Administered 2013-11-18 – 2013-11-19 (×3): via INTRAVENOUS

## 2013-11-18 MED ORDER — FENTANYL CITRATE 0.05 MG/ML IJ SOLN
INTRAMUSCULAR | Status: DC | PRN
  Administered 2013-11-18 (×2): 50 ug via INTRAVENOUS
  Administered 2013-11-18: 100 ug via INTRAVENOUS
  Administered 2013-11-18: 50 ug via INTRAVENOUS

## 2013-11-18 MED ORDER — DIPHENHYDRAMINE HCL 25 MG PO CAPS
50.0000 mg | ORAL_CAPSULE | Freq: Three times a day (TID) | ORAL | Status: DC | PRN
  Administered 2013-11-18 – 2013-11-19 (×3): 50 mg via ORAL
  Filled 2013-11-18 (×3): qty 2

## 2013-11-18 MED ORDER — DEXTROSE 5 % IV SOLN
INTRAVENOUS | Status: AC
  Filled 2013-11-18: qty 2

## 2013-11-18 MED ORDER — DEXTROSE 5 % IV SOLN: 1.0000 g | Freq: Once | INTRAVENOUS | Status: DC

## 2013-11-18 MED ORDER — KETOROLAC TROMETHAMINE 15 MG/ML IJ SOLN: 30.0000 mg | Freq: Once | INTRAMUSCULAR | Status: DC

## 2013-11-18 MED ORDER — KETOROLAC TROMETHAMINE 30 MG/ML IJ SOLN
30.0000 mg | Freq: Once | INTRAMUSCULAR | Status: AC
  Administered 2013-11-18: 30 mg via INTRAVENOUS

## 2013-11-18 MED ORDER — ONDANSETRON HCL 4 MG PO TABS: 4.0000 mg | ORAL_TABLET | Freq: Four times a day (QID) | ORAL | Status: DC | PRN

## 2013-11-18 MED ORDER — HYDROMORPHONE HCL PF 1 MG/ML IJ SOLN
0.5000 mg | INTRAMUSCULAR | Status: DC
  Administered 2013-11-18 (×2): 0.5 mg via INTRAVENOUS

## 2013-11-18 MED ORDER — FENTANYL CITRATE 0.05 MG/ML IJ SOLN
INTRAMUSCULAR | Status: AC
  Filled 2013-11-18: qty 5

## 2013-11-18 MED ORDER — KETOROLAC TROMETHAMINE 15 MG/ML IJ SOLN
15.0000 mg | Freq: Four times a day (QID) | INTRAMUSCULAR | Status: DC | PRN
  Administered 2013-11-18: 15 mg via INTRAVENOUS
  Filled 2013-11-18: qty 1

## 2013-11-18 MED ORDER — DEXAMETHASONE SODIUM PHOSPHATE 4 MG/ML IJ SOLN
4.0000 mg | Freq: Once | INTRAMUSCULAR | Status: AC
  Administered 2013-11-18: 4 mg via INTRAVENOUS

## 2013-11-18 MED ORDER — LIDOCAINE HCL (PF) 1 % IJ SOLN
INTRAMUSCULAR | Status: AC
  Filled 2013-11-18: qty 5

## 2013-11-18 MED ORDER — FENTANYL CITRATE 0.05 MG/ML IJ SOLN
25.0000 ug | INTRAMUSCULAR | Status: DC | PRN
  Administered 2013-11-18 (×3): 50 ug via INTRAVENOUS

## 2013-11-18 MED ORDER — DEXAMETHASONE SODIUM PHOSPHATE 4 MG/ML IJ SOLN
INTRAMUSCULAR | Status: AC
  Filled 2013-11-18: qty 1

## 2013-11-18 MED ORDER — MIDAZOLAM HCL 2 MG/2ML IJ SOLN
INTRAMUSCULAR | Status: AC
Start: 2013-11-18 — End: 2013-11-18
  Filled 2013-11-18: qty 2

## 2013-11-18 MED ORDER — ROCURONIUM BROMIDE 100 MG/10ML IV SOLN
INTRAVENOUS | Status: DC | PRN
  Administered 2013-11-18: 5 mg via INTRAVENOUS
  Administered 2013-11-18: 25 mg via INTRAVENOUS
  Administered 2013-11-18: 10 mg via INTRAVENOUS

## 2013-11-18 MED ORDER — LACTATED RINGERS IV SOLN
INTRAVENOUS | Status: DC
  Administered 2013-11-18 (×2): via INTRAVENOUS

## 2013-11-18 MED ORDER — KETOROLAC TROMETHAMINE 30 MG/ML IJ SOLN
INTRAMUSCULAR | Status: AC
  Filled 2013-11-18: qty 1

## 2013-11-18 MED ORDER — LIDOCAINE HCL (CARDIAC) 10 MG/ML IV SOLN
INTRAVENOUS | Status: DC | PRN
  Administered 2013-11-18: 20 mg via INTRAVENOUS

## 2013-11-18 MED ORDER — PROPOFOL 10 MG/ML IV EMUL
INTRAVENOUS | Status: AC
  Filled 2013-11-18: qty 20

## 2013-11-18 MED ORDER — HEMOSTATIC AGENTS (NO CHARGE) OPTIME
TOPICAL | Status: DC | PRN
  Administered 2013-11-18: 1 via TOPICAL

## 2013-11-18 MED ORDER — DEXTROSE 5 % IV SOLN
1.0000 g | Freq: Once | INTRAVENOUS | Status: AC
  Administered 2013-11-18: 1 g via INTRAVENOUS

## 2013-11-18 MED ORDER — NEOSTIGMINE METHYLSULFATE 10 MG/10ML IV SOLN
INTRAVENOUS | Status: DC | PRN
  Administered 2013-11-18: 3 mg via INTRAVENOUS

## 2013-11-18 SURGICAL SUPPLY — 65 items
APPLICATOR COTTON TIP 6IN STRL (MISCELLANEOUS) ×3 IMPLANT
APPLIER CLIP LAPSCP 10X32 DD (CLIP) ×3 IMPLANT
ATTRACTOMAT 16X20 MAGNETIC DRP (DRAPES) IMPLANT
BAG HAMPER (MISCELLANEOUS) ×3 IMPLANT
BLADE 15 SAFETY STRL DISP (BLADE) ×3 IMPLANT
CLOTH BEACON ORANGE TIMEOUT ST (SAFETY) ×3 IMPLANT
COVER LIGHT HANDLE STERIS (MISCELLANEOUS) ×6 IMPLANT
DECANTER SPIKE VIAL GLASS SM (MISCELLANEOUS) ×3 IMPLANT
DISSECTOR BLUNT TIP ENDO 5MM (MISCELLANEOUS) ×3 IMPLANT
DRAPE WARM FLUID 44X44 (DRAPE) IMPLANT
DRSG TEGADERM 2-3/8X2-3/4 SM (GAUZE/BANDAGES/DRESSINGS) ×9 IMPLANT
ELECT BLADE 6 FLAT ULTRCLN (ELECTRODE) IMPLANT
ELECT REM PT RETURN 9FT ADLT (ELECTROSURGICAL) ×3
ELECTRODE REM PT RTRN 9FT ADLT (ELECTROSURGICAL) ×1 IMPLANT
EVACUATOR DRAINAGE 10X20 100CC (DRAIN) ×1 IMPLANT
EVACUATOR SILICONE 100CC (DRAIN) ×2
FILTER SMOKE EVAC LAPAROSHD (FILTER) ×3 IMPLANT
FORMALIN 10 PREFIL 120ML (MISCELLANEOUS) ×3 IMPLANT
GAUZE SPONGE 4X4 12PLY STRL (GAUZE/BANDAGES/DRESSINGS) ×2 IMPLANT
GAUZE SPONGE 4X4 16PLY XRAY LF (GAUZE/BANDAGES/DRESSINGS) ×3 IMPLANT
GLOVE ECLIPSE 6.5 STRL STRAW (GLOVE) ×6 IMPLANT
GLOVE INDICATOR 7.0 STRL GRN (GLOVE) ×6 IMPLANT
GLOVE SKINSENSE NS SZ7.0 (GLOVE) ×2
GLOVE SKINSENSE STRL SZ7.0 (GLOVE) ×1 IMPLANT
GOWN STRL REUS W/TWL LRG LVL3 (GOWN DISPOSABLE) ×9 IMPLANT
HEMOSTAT SURGICEL 4X8 (HEMOSTASIS) ×3 IMPLANT
INST SET LAPROSCOPIC AP (KITS) ×3 IMPLANT
IV NS IRRIG 3000ML ARTHROMATIC (IV SOLUTION) ×3 IMPLANT
KIT ROOM TURNOVER APOR (KITS) ×3 IMPLANT
MANIFOLD NEPTUNE II (INSTRUMENTS) ×3 IMPLANT
NEEDLE INSUFFLATION 14GA 120MM (NEEDLE) ×3 IMPLANT
NS IRRIG 1000ML POUR BTL (IV SOLUTION) ×3 IMPLANT
PACK LAP CHOLE LZT030E (CUSTOM PROCEDURE TRAY) ×3 IMPLANT
PAD ARMBOARD 7.5X6 YLW CONV (MISCELLANEOUS) ×3 IMPLANT
PENCIL HANDSWITCHING (ELECTRODE) IMPLANT
POUCH SPECIMEN RETRIEVAL 10MM (ENDOMECHANICALS) ×3 IMPLANT
SET BASIN LINEN APH (SET/KITS/TRAYS/PACK) ×3 IMPLANT
SET TUBE IRRIG SUCTION NO TIP (IRRIGATION / IRRIGATOR) ×3 IMPLANT
SLEEVE ENDOPATH XCEL 5M (ENDOMECHANICALS) ×3 IMPLANT
SOL PREP PROV IODINE SCRUB 4OZ (MISCELLANEOUS) ×3 IMPLANT
SPONGE DRAIN TRACH 4X4 STRL 2S (GAUZE/BANDAGES/DRESSINGS) ×3 IMPLANT
SPONGE GAUZE 4X4 12PLY (GAUZE/BANDAGES/DRESSINGS) ×3 IMPLANT
SPONGE INTESTINAL PEANUT (DISPOSABLE) IMPLANT
SPONGE LAP 18X18 X RAY DECT (DISPOSABLE) IMPLANT
STAPLER VISISTAT 35W (STAPLE) ×3 IMPLANT
SUT ETHILON 3 0 FSL (SUTURE) ×3 IMPLANT
SUT SILK 2 0 (SUTURE)
SUT SILK 2 0 SH (SUTURE) IMPLANT
SUT SILK 2-0 18XBRD TIE 12 (SUTURE) IMPLANT
SUT SILK 3 0 SH CR/8 (SUTURE) IMPLANT
SUT VIC AB 0 CT1 27 (SUTURE)
SUT VIC AB 0 CT1 27XBRD ANTBC (SUTURE) IMPLANT
SUT VIC AB 0 CT1 27XCR 8 STRN (SUTURE) IMPLANT
SUT VICRYL 0 UR6 27IN ABS (SUTURE) ×3 IMPLANT
SYR BULB IRRIGATION 50ML (SYRINGE) IMPLANT
TAPE CLOTH SURG 4X10 WHT LF (GAUZE/BANDAGES/DRESSINGS) ×3 IMPLANT
TOWEL OR 17X26 4PK STRL BLUE (TOWEL DISPOSABLE) ×3 IMPLANT
TRAY FOLEY CATH 16FR SILVER (SET/KITS/TRAYS/PACK) ×3 IMPLANT
TROCAR ENDO BLADELESS 11MM (ENDOMECHANICALS) ×3 IMPLANT
TROCAR XCEL NON-BLD 5MMX100MML (ENDOMECHANICALS) ×3 IMPLANT
TROCAR XCEL UNIV SLVE 11M 100M (ENDOMECHANICALS) ×3 IMPLANT
TUBING INSUFFLATION HIGH FLOW (TUBING) ×3 IMPLANT
WARMER LAPAROSCOPE (MISCELLANEOUS) ×3 IMPLANT
WATER STERILE IRR 1000ML POUR (IV SOLUTION) ×6 IMPLANT
YANKAUER SUCT BULB TIP 10FT TU (MISCELLANEOUS) IMPLANT

## 2013-11-18 NOTE — Anesthesia Preprocedure Evaluation (Signed)
Anesthesia Evaluation  Patient identified by MRN, date of birth, ID band Patient awake    Reviewed: Allergy & Precautions, H&P , NPO status , Patient's Chart, lab work & pertinent test results  Airway Mallampati: I TM Distance: >3 FB     Dental  (+) Teeth Intact   Pulmonary former smoker,  breath sounds clear to auscultation        Cardiovascular negative cardio ROS  Rhythm:Regular Rate:Normal     Neuro/Psych    GI/Hepatic GERD-  Medicated and Controlled,  Endo/Other    Renal/GU      Musculoskeletal   Abdominal   Peds  Hematology   Anesthesia Other Findings   Reproductive/Obstetrics                           Anesthesia Physical Anesthesia Plan  ASA: II  Anesthesia Plan: General   Post-op Pain Management:    Induction: Intravenous, Rapid sequence and Cricoid pressure planned  Airway Management Planned: Oral ETT  Additional Equipment:   Intra-op Plan:   Post-operative Plan: Extubation in OR  Informed Consent: I have reviewed the patients History and Physical, chart, labs and discussed the procedure including the risks, benefits and alternatives for the proposed anesthesia with the patient or authorized representative who has indicated his/her understanding and acceptance.     Plan Discussed with:   Anesthesia Plan Comments:         Anesthesia Quick Evaluation

## 2013-11-18 NOTE — Anesthesia Postprocedure Evaluation (Signed)
Anesthesia Post Note  Patient: Curtis Trevino  Procedure(s) Performed: Procedure(s) (LRB): LAPAROSCOPIC CHOLECYSTECTOMY (N/A)  Anesthesia type: General  Patient location: PACU  Post pain: Pain level controlled  Post assessment: Post-op Vital signs reviewed, Patient's Cardiovascular Status Stable, Respiratory Function Stable, Patent Airway, No signs of Nausea or vomiting and Pain level controlled    Post vital signs: Reviewed and stable  Level of consciousness: awake and alert   Complications: No apparent anesthesia complications

## 2013-11-18 NOTE — Anesthesia Procedure Notes (Signed)
Procedure Name: Intubation Date/Time: 11/18/2013 7:42 AM Performed by: Vista Deck Pre-anesthesia Checklist: Patient identified, Patient being monitored, Timeout performed, Emergency Drugs available and Suction available Patient Re-evaluated:Patient Re-evaluated prior to inductionOxygen Delivery Method: Circle System Utilized Preoxygenation: Pre-oxygenation with 100% oxygen Intubation Type: IV induction Ventilation: Mask ventilation without difficulty Laryngoscope Size: Miller and 2 Grade View: Grade I Tube type: Oral Tube size: 8.0 mm Number of attempts: 1 Airway Equipment and Method: stylet and Oral airway Placement Confirmation: ETT inserted through vocal cords under direct vision,  positive ETCO2 and breath sounds checked- equal and bilateral Secured at: 22 cm Tube secured with: Tape Dental Injury: Teeth and Oropharynx as per pre-operative assessment

## 2013-11-18 NOTE — Op Note (Signed)
NAMEMarland Trevino  Curtis Trevino, POTH NO.:  [id number]  MEDICAL RECORD NO.:  [id number]  LOCATION:  A307                          FACILITY:  [facility]  PHYSICIAN:  Felicie Morn, M.D. DATE OF BIRTH:  Dec 26, 1965  DATE OF PROCEDURE:  11/18/2013 DATE OF DISCHARGE:                              OPERATIVE REPORT   SURGEON:  Felicie Morn, MD.  PREOPERATIVE DIAGNOSES:  Cholecystitis and cholelithiasis.  POSTOPERATIVE DIAGNOSES:  Cholecystitis and cholelithiasis.  PROCEDURE:  Laparoscopic cholecystectomy.  WOUND CLASSIFICATION:  Clean, contaminated.  SPECIMEN:  Gallbladder and stones.  NOTE:  This is a 48 year old white male who had approximately a 6 month history of recurrent right upper quadrant pain radiating to the back accompanied with nausea.  He had this last episode approximately 6 months ago but these symptoms in fact had been going on for at least a year.  Sonogram revealed the presence of cholelithiasis.  His preoperative liver function studies were grossly within normal limits as was his amylase.  We placed him on a restrictive diet and plan for elective surgery via the outpatient department.  We discussed complications not limited to but including bleeding, infection, damage to bile ducts, perforation of organs, transitory diarrhea, and the possibility that open surgery might be required. Informed consent was obtained.  GROSS OPERATIVE FINDINGS:  The patient had some fatty infiltrative gallbladder with approximately 1 inch elongated stone within the gallbladder.  The right upper quadrant otherwise was grossly within normal limits.  TECHNIQUE:  The patient was placed in the supine position.  After the adequate administration of general anesthesia, a Foley catheter was aseptically inserted and he was prepped with Betadine solution and draped in usual manner.  A periumbilical incision was carried out over the superior aspect of the umbilicus.  The fascia was  grasped with a towel clip and elevated and a Veress needle was inserted and confirmed the position with a saline drop test.  We then insufflated the abdomen. At first the insufflator was not working.  There was no air coming out of it.  This had been rectified.  We then insufflated the abdomen with approximately 3.5 L of CO2.  We then placed using a Visiport technique 11-mm cannula in the epigastrium and in the umbilicus.  Under direct vision, we placed two 5-mm cannulas in right upper quadrant laterally. We grasped the gallbladder and took down its adhesions, identified the cystic duct, triply silver clipped this and divided this as we did the cystic artery.  There was little bit of bleeding along the liver bed as the gallbladder was slightly intrahepatic.  However, this was easily controlled with a cautery device.  We created dissection but the anatomy was visible without too much trouble.  After irrigating and checking for hemostasis, I elected to leave Surgicel below the liver bed and a Jackson-Pratt drain in the liver bed which exited from one of the lateral most incisions.  We then desufflated the abdomen, and I closed the fascia in the area of the umbilicus and the epigastrium with 0 Polysorb and the skin with a stapling device.  We used 0.5% Sensorcaine at all port sites for postoperative comfort.  Sterile dressings were applied.  Prior to closure, all sponge, needle, and instrument counts were found to be correct.  Estimated blood loss was less than 25 mL. 1500 mL of crystalloids intraoperatively were given.  There were no complications.     Felicie Morn, M.D.     WB/MEDQ  D:  11/18/2013  T:  11/18/2013  Job:  [id number]

## 2013-11-18 NOTE — Progress Notes (Signed)
Post OP Check  Awake and alert. C/O some incisional pain which dilaudid has helped but has given him some itching.  No rash. Will give benadryl.  Dressings dry and in tact with minimal serous JP drainage.  Doing well post op. Filed Vitals:   11/18/13 1330  BP: 122/80  Pulse: 96  Temp: 98 F (36.7 C)  Resp: 20

## 2013-11-18 NOTE — Brief Op Note (Signed)
11/18/2013  9:25 AM  PATIENT:  Nathaneil Canary  48 y.o. male  PRE-OPERATIVE DIAGNOSIS:  cholelithiasis  POST-OPERATIVE DIAGNOSIS:  cholelithiasis  PROCEDURE:  Procedure(s): LAPAROSCOPIC CHOLECYSTECTOMY (N/A)  SURGEON:  Surgeon(s) and Role:    * Scherry Ran, MD - Primary  PHYSICIAN ASSISTANT:   ASSISTANTS: none   ANESTHESIA:   general  EBL:  Total I/O In: 1400 [I.V.:1400] Out: 120 [Urine:100; Blood:20]  BLOOD ADMINISTERED:none  DRAINS: Penrose drain in the in the liver bed.   LOCAL MEDICATIONS USED:  MARCAINE 0.5%  ~ 10 cc.    SPECIMEN:  Source of Specimen:  Gall bladder and stone.  DISPOSITION OF SPECIMEN:  PATHOLOGY  COUNTS:  YES  TOURNIQUET:  * No tourniquets in log *  DICTATION: .Other Dictation: Dictation Number OR dict. # [id number].  PLAN OF CARE: Admit for overnight observation  PATIENT DISPOSITION:  PACU - hemodynamically stable.   Delay start of Pharmacological VTE agent (>24hrs) due to surgical blood loss or risk of bleeding: not applicable

## 2013-11-18 NOTE — Progress Notes (Signed)
Admit via OP dept 81 yr. pld W. Male for lap Gall bladdrer surgery for gall stones.  Procedure and risks explained and informed consent obtained.  Labs reviewed and no clinical changes since H&P, dict.# [id number]. Filed Vitals:   11/18/13 0642  BP: 114/77  Pulse: 84  Temp: 98.2 F (36.8 C)  Resp: 20

## 2013-11-18 NOTE — Transfer of Care (Signed)
Immediate Anesthesia Transfer of Care Note  Patient: Curtis Trevino  Procedure(s) Performed: Procedure(s) (LRB): LAPAROSCOPIC CHOLECYSTECTOMY (N/A)  Patient Location: PACU  Anesthesia Type: General  Level of Consciousness: awake  Airway & Oxygen Therapy: Patient Spontanous Breathing and non-rebreather face mask  Post-op Assessment: Report given to PACU RN, Post -op Vital signs reviewed and stable and Patient moving all extremities  Post vital signs: Reviewed and stable  Complications: No apparent anesthesia complications

## 2013-11-19 ENCOUNTER — Encounter (HOSPITAL_COMMUNITY): Payer: Self-pay | Admitting: General Surgery

## 2013-11-19 DIAGNOSIS — K8010 Calculus of gallbladder with chronic cholecystitis without obstruction: Secondary | ICD-10-CM | POA: Diagnosis not present

## 2013-11-19 LAB — BASIC METABOLIC PANEL
ANION GAP: 10 (ref 5–15)
BUN: 11 mg/dL (ref 6–23)
CALCIUM: 8.6 mg/dL (ref 8.4–10.5)
CO2: 25 mEq/L (ref 19–32)
CREATININE: 1.23 mg/dL (ref 0.50–1.35)
Chloride: 107 mEq/L (ref 96–112)
GFR calc Af Amer: 80 mL/min — ABNORMAL LOW (ref >90)
GFR calc non Af Amer: 69 mL/min — ABNORMAL LOW (ref >90)
Glucose, Bld: 116 mg/dL — ABNORMAL HIGH (ref 70–99)
Potassium: 3.9 mEq/L (ref 3.7–5.3)
Sodium: 142 mEq/L (ref 137–147)

## 2013-11-19 LAB — CBC
HCT: 37.7 % — ABNORMAL LOW (ref 39.0–52.0)
Hemoglobin: 12.3 g/dL — ABNORMAL LOW (ref 13.0–17.0)
MCH: 28.7 pg (ref 26.0–34.0)
MCHC: 32.6 g/dL (ref 30.0–36.0)
MCV: 87.9 fL (ref 78.0–100.0)
PLATELETS: 205 10*3/uL (ref 150–400)
RBC: 4.29 MIL/uL (ref 4.22–5.81)
RDW: 14.5 % (ref 11.5–15.5)
WBC: 8.7 10*3/uL (ref 4.0–10.5)

## 2013-11-19 LAB — HEPATIC FUNCTION PANEL
ALT: 48 U/L (ref 0–53)
AST: 36 U/L (ref 0–37)
Albumin: 3.6 g/dL (ref 3.5–5.2)
Alkaline Phosphatase: 30 U/L — ABNORMAL LOW (ref 39–117)
Total Bilirubin: 0.4 mg/dL (ref 0.3–1.2)
Total Protein: 6.5 g/dL (ref 6.0–8.3)

## 2013-11-19 MED ORDER — TRAMADOL HCL 50 MG PO TABS: 50.0000 mg | ORAL_TABLET | Freq: Four times a day (QID) | ORAL | Status: DC | PRN

## 2013-11-19 MED ORDER — DSS 100 MG PO CAPS: 100.0000 mg | ORAL_CAPSULE | Freq: Every day | ORAL | Status: DC

## 2013-11-19 NOTE — Discharge Summary (Signed)
NAMEMarland Kitchen  Curtis Trevino, Curtis Trevino NO.:  [id number]  MEDICAL RECORD NO.:  [id number]  LOCATION:  A307                          FACILITY:  [facility]  PHYSICIAN:  Felicie Morn, M.D. DATE OF BIRTH:  05/27/1965  DATE OF ADMISSION:  11/18/2013 DATE OF DISCHARGE:  08/12/2015LH                              DISCHARGE SUMMARY   DIAGNOSES:  Cholecystitis, cholelithiasis.  PROCEDURE:  On November 18, 2013, laparoscopic cholecystectomy.  NOTE:  This is a 48 year old white male who had approximately 3 years history of abdominal pain that was postprandial in nature and radiated to his back with accompanying nausea and vomiting at times.  He had a sonogram preoperatively which revealed the presence of a gallstone that was at least 2 cm in diameter.  Liver function studies preoperatively were grossly within normal limits.  We placed him on a restrictive diet and planned for an elective cholecystectomy.  This was carried out via the outpatient department and he did well postoperatively.  At the time of discharge, his wound was clean.  His Jackson-Pratt drain had minimal serous drainage, and his liver function studies postoperatively were within normal limits with no bump in his bilirubin.  His pain was well controlled, and his wounds were clean without sign of infection.  He had no chest pain, shortness of breath, leg pain, or dysuria.  He was discharged on the first postoperative day.  Followup arrangements were made.  He is told to contact me if he has any acute changes, and we have made followup arrangements to see him in the office after which he is to return to his medical doctor.     Felicie Morn, M.D.     WB/MEDQ  D:  11/19/2013  T:  11/19/2013  Job:  [id number]  cc:   Dr. Hilma Favors

## 2013-11-19 NOTE — Care Management Note (Signed)
    Page 1 of 1   11/19/2013     1:47:54 PM CARE MANAGEMENT NOTE 11/19/2013  Patient:  Curtis Trevino, Curtis Trevino   Account Number:  [id number]  Date Initiated:  11/19/2013  Documentation initiated by:  Jolene Provost  Subjective/Objective Assessment:   Patient OIB p lap chole. patient independent from from.     Action/Plan:   Patient plans to discharge home with self care. No CM needs at this time.   Anticipated DC Date:  11/19/2013   Anticipated DC Plan:  [place]  CM consult      Choice offered to / List presented to:             Status of service:  Completed, signed off Medicare Important Message given?   (If response is "NO", the following Medicare IM given date fields will be blank) Date Medicare IM given:   Medicare IM given by:   Date Additional Medicare IM given:   Additional Medicare IM given by:    Discharge Disposition:    Per UR Regulation:    If discussed at Long Length of Stay Meetings, dates discussed:    Comments:  11/19/2013 [place], RN, MSN, [facility]

## 2013-11-19 NOTE — Progress Notes (Signed)
POD # 1  Filed Vitals:   11/19/13 0500  BP: 111/56  Pulse: 71  Temp: 98.5 F (36.9 C)  Resp: 20   Wound clean and dry minimal JP drainage minimal and serous in nature.  Drain removed.  Labs OK with no bump in his bili.  Doing well post op; discharge and follow up arranged. Discharge dict. # [id number].

## 2013-11-19 NOTE — Progress Notes (Signed)
UR review complete.  

## 2013-11-19 NOTE — Progress Notes (Signed)
Patient states understanding of discharge instructions, prescription given. 

## 2014-04-06 ENCOUNTER — Other Ambulatory Visit (HOSPITAL_COMMUNITY): Payer: Self-pay | Admitting: Family Medicine

## 2014-04-06 DIAGNOSIS — M25551 Pain in right hip: Secondary | ICD-10-CM

## 2014-04-08 ENCOUNTER — Ambulatory Visit (HOSPITAL_COMMUNITY)
Admission: RE | Admit: 2014-04-08 | Discharge: 2014-04-08 | Disposition: A | Discharge status: Home / Self Care | Payer: BC Managed Care – PPO | Source: Ambulatory Visit | Attending: Family Medicine | Admitting: Family Medicine

## 2014-04-08 DIAGNOSIS — M25551 Pain in right hip: Secondary | ICD-10-CM | POA: Insufficient documentation

## 2014-04-13 ENCOUNTER — Ambulatory Visit (HOSPITAL_COMMUNITY): Payer: BC Managed Care – PPO

## 2014-05-06 ENCOUNTER — Other Ambulatory Visit (HOSPITAL_COMMUNITY): Payer: Self-pay | Admitting: Orthopedic Surgery

## 2014-05-06 DIAGNOSIS — M25551 Pain in right hip: Secondary | ICD-10-CM

## 2014-05-11 ENCOUNTER — Encounter (HOSPITAL_COMMUNITY)
Admission: RE | Admit: 2014-05-11 | Discharge: 2014-05-11 | Disposition: A | Discharge status: Home / Self Care | Payer: BLUE CROSS/BLUE SHIELD | Source: Ambulatory Visit | Attending: Orthopedic Surgery | Admitting: Orthopedic Surgery

## 2014-05-11 ENCOUNTER — Encounter (HOSPITAL_COMMUNITY): Payer: Self-pay

## 2014-05-11 DIAGNOSIS — M25551 Pain in right hip: Secondary | ICD-10-CM | POA: Diagnosis present

## 2014-05-11 MED ORDER — TECHNETIUM TC 99M MEDRONATE IV KIT
25.0000 | PACK | Freq: Once | INTRAVENOUS | Status: AC | PRN
  Administered 2014-05-11: 25 via INTRAVENOUS

## 2014-06-19 ENCOUNTER — Emergency Department (INDEPENDENT_AMBULATORY_CARE_PROVIDER_SITE_OTHER)
Admission: EM | Admit: 2014-06-19 | Discharge: 2014-06-19 | Disposition: A | Discharge status: Home / Self Care | Payer: BLUE CROSS/BLUE SHIELD | Source: Home / Self Care | Attending: Emergency Medicine | Admitting: Emergency Medicine

## 2014-06-19 ENCOUNTER — Encounter (HOSPITAL_COMMUNITY): Payer: Self-pay | Admitting: Emergency Medicine

## 2014-06-19 DIAGNOSIS — A084 Viral intestinal infection, unspecified: Secondary | ICD-10-CM

## 2014-06-19 MED ORDER — ONDANSETRON HCL 4 MG PO TABS: 4.0000 mg | ORAL_TABLET | Freq: Four times a day (QID) | ORAL | Status: DC | PRN

## 2014-06-19 NOTE — ED Notes (Signed)
C/o having diarrhea and vomiting since Wednesday morning Has had 3 episodes today of vomiting and loose stools Sx also include fevers and chills Alert, no signs of acute distress.

## 2014-06-19 NOTE — Discharge Instructions (Signed)
You have the stomach flu. Make sure you're drinking plenty of fluids. Take Zofran every 6-8 hours as needed for nausea. You can slowly start solid food. I would start with soup, toast, eggs. You can start taking Imodium tomorrow. Wash your hands frequently. Follow-up with your PCP next week if symptoms have not resolved.

## 2014-06-19 NOTE — ED Provider Notes (Signed)
CSN: [id number]     Arrival date & time 06/19/14  4098 History   First MD Initiated Contact with Patient 06/19/14 [phone]     Chief Complaint  Patient presents with  . Emesis  . Diarrhea   (Consider location/radiation/quality/duration/timing/severity/associated sxs/prior Treatment) HPI  He is a 49 year old man here for evaluation of vomiting and diarrhea. His symptoms started on Wednesday with multiple episodes of nonbloody nonbilious vomiting and watery, nonbloody stool. The vomiting improved on Thursday and he ate [place] food that night. The vomiting recurred this morning. He continues to have frequent watery stools. He denies any abdominal pain. He reports subjective fevers, chills, body aches. He also reports low energy.  He denies any dizziness. He is able to tolerate Gatorade without difficulty.  Past Medical History  Diagnosis Date  . Acid reflux   . Chronic back pain    Past Surgical History  Procedure Laterality Date  . Esophagogastroduodenoscopy  2005  . Cholecystectomy N/A 11/18/2013    Procedure: LAPAROSCOPIC CHOLECYSTECTOMY;  Surgeon: Scherry Ran, MD;  Location: [facility];  Service: General;  Laterality: N/A;   No family history on file. History  Substance Use Topics  . Smoking status: Former Smoker    Quit date: 11/15/2010  . Smokeless tobacco: Not on file  . Alcohol Use: Yes    Review of Systems  Constitutional: Positive for fever, chills, appetite change and fatigue.  HENT: Negative.   Respiratory: Negative.   Cardiovascular: Negative.   Gastrointestinal: Positive for nausea, vomiting and diarrhea. Negative for abdominal pain.  Neurological: Negative for dizziness.    Allergies  Tylenol and Codeine  Home Medications   Prior to Admission medications   Medication Sig Start Date End Date Taking? Authorizing Provider  docusate sodium 100 MG CAPS Take 100 mg by mouth daily. 11/19/13   Felicie Morn, MD  omeprazole (PRILOSEC OTC) 20 MG tablet Take 20 mg  by mouth daily.    Historical Provider, MD  ondansetron (ZOFRAN) 4 MG tablet Take 1 tablet (4 mg total) by mouth every 6 (six) hours as needed for nausea or vomiting. 06/19/14   Melony Overly, MD  topiramate (TOPAMAX) 100 MG tablet Take 100 mg by mouth daily. 09/16/13   Historical Provider, MD  traMADol (ULTRAM) 50 MG tablet Take 1 tablet (50 mg total) by mouth every 6 (six) hours as needed. 11/19/13   Felicie Morn, MD   BP 111/75 mmHg  Pulse 94  Temp(Src) 99.4 F (37.4 C) (Oral)  Resp 16  SpO2 100% Physical Exam  Constitutional: He is oriented to person, place, and time. He appears well-developed and well-nourished. No distress.  HENT:  Head: Normocephalic and atraumatic.  Mouth/Throat: Oropharynx is clear and moist.  Neck: Neck supple.  Cardiovascular: Normal rate, regular rhythm and normal heart sounds.   No murmur heard. Pulmonary/Chest: Effort normal and breath sounds normal. No respiratory distress. He has no wheezes. He has no rales.  Abdominal: Soft. He exhibits no distension. There is no tenderness. There is no rebound and no guarding.  Neurological: He is alert and oriented to person, place, and time.    ED Course  Procedures (including critical care time) Labs Review Labs Reviewed - No data to display  Imaging Review No results found.   MDM   1. Viral gastroenteritis    Symptomatic treatment with Zofran. Discussed gradually adding back bland foods. Okay to start Imodium tomorrow. Follow-up as needed.    Melony Overly, MD 06/19/14 1122

## 2014-09-24 ENCOUNTER — Other Ambulatory Visit (HOSPITAL_COMMUNITY): Payer: Self-pay | Admitting: Family Medicine

## 2014-09-24 ENCOUNTER — Ambulatory Visit (HOSPITAL_COMMUNITY)
Admission: RE | Admit: 2014-09-24 | Discharge: 2014-09-24 | Disposition: A | Discharge status: Home / Self Care | Payer: BLUE CROSS/BLUE SHIELD | Source: Ambulatory Visit | Attending: Family Medicine | Admitting: Family Medicine

## 2014-09-24 DIAGNOSIS — S3992XA Unspecified injury of lower back, initial encounter: Secondary | ICD-10-CM

## 2014-09-24 DIAGNOSIS — M545 Low back pain: Secondary | ICD-10-CM | POA: Insufficient documentation

## 2016-05-10 ENCOUNTER — Other Ambulatory Visit (HOSPITAL_COMMUNITY): Payer: Self-pay | Admitting: Registered Nurse

## 2016-05-10 DIAGNOSIS — N5082 Scrotal pain: Secondary | ICD-10-CM

## 2016-05-17 ENCOUNTER — Ambulatory Visit (HOSPITAL_COMMUNITY)
Admission: RE | Admit: 2016-05-17 | Discharge: 2016-05-17 | Disposition: A | Discharge status: Home / Self Care | Payer: BLUE CROSS/BLUE SHIELD | Source: Ambulatory Visit | Attending: Registered Nurse | Admitting: Registered Nurse

## 2016-05-17 DIAGNOSIS — N5082 Scrotal pain: Secondary | ICD-10-CM | POA: Diagnosis present

## 2016-05-17 DIAGNOSIS — N5089 Other specified disorders of the male genital organs: Secondary | ICD-10-CM | POA: Insufficient documentation

## 2016-05-26 ENCOUNTER — Ambulatory Visit (INDEPENDENT_AMBULATORY_CARE_PROVIDER_SITE_OTHER): Payer: BLUE CROSS/BLUE SHIELD | Admitting: Urology

## 2016-05-26 DIAGNOSIS — D2932 Benign neoplasm of left epididymis: Secondary | ICD-10-CM | POA: Diagnosis not present

## 2016-05-30 ENCOUNTER — Other Ambulatory Visit: Payer: Self-pay | Admitting: Urology

## 2016-06-15 NOTE — Patient Instructions (Signed)
Curtis Trevino  06/15/2016     @PREFPERIOPPHARMACY @   Your procedure is scheduled on  06/23/2016 .  Report to [facility] at  1030  A.M.  Call this number if you have problems the morning of surgery:  [phone]   Remember:  Do not eat food or drink liquids after midnight.  Take these medicines the morning of surgery with A SIP OF WATER  Prilosec, zofran, topamax, ultram.   Do not wear jewelry, make-up or nail polish.  Do not wear lotions, powders, or perfumes, or deoderant.  Do not shave 48 hours prior to surgery.  Men may shave face and neck.  Do not bring valuables to the hospital.  [facility] is not responsible for any belongings or valuables.  Contacts, dentures or bridgework may not be worn into surgery.  Leave your suitcase in the car.  After surgery it may be brought to your room.  For patients admitted to the hospital, discharge time will be determined by your treatment team.  Patients discharged the day of surgery will not be allowed to drive home.   Name and phone number of your driver:   family Special instructions:  None  Please read over the following fact sheets that you were given. Anesthesia Post-op Instructions and Care and Recovery After Surgery       Incision Care, Adult An incision is a cut that a doctor makes in your skin for surgery (for a procedure). Most times, these cuts are closed after surgery. Your cut from surgery may be closed with stitches (sutures), staples, skin glue, or skin tape (adhesive strips). You may need to return to your doctor to have stitches or staples taken out. This may happen many days or many weeks after your surgery. The cut needs to be well cared for so it does not get infected. How to care for your cut Cut care   Follow instructions from your doctor about how to take care of your cut. Make sure you:  Wash your hands with soap and water before you change your bandage (dressing). If you cannot use soap and  water, use hand sanitizer.  Change your bandage as told by your doctor.  Leave stitches, skin glue, or skin tape in place. They may need to stay in place for 2 weeks or longer. If tape strips get loose and curl up, you may trim the loose edges. Do not remove tape strips completely unless your doctor says it is okay.  Check your cut area every day for signs of infection. Check for:  More redness, swelling, or pain.  More fluid or blood.  Warmth.  Pus or a bad smell.  Ask your doctor how to clean the cut. This may include:  Using mild soap and water.  Using a clean towel to pat the cut dry after you clean it.  Putting a cream or ointment on the cut. Do this only as told by your doctor.  Covering the cut with a clean bandage.  Ask your doctor when you can leave the cut uncovered.  Do not take baths, swim, or use a hot tub until your doctor says it is okay. Ask your doctor if you can take showers. You may only be allowed to take sponge baths for bathing. Medicines   If you were prescribed an antibiotic medicine, cream, or ointment, take the antibiotic or put it on the cut as told by your doctor. Do not  stop taking or putting on the antibiotic even if your condition gets better.  Take over-the-counter and prescription medicines only as told by your doctor. General instructions   Limit movement around your cut. This helps healing.  Avoid straining, lifting, or exercise for the first month, or for as long as told by your doctor.  Follow instructions from your doctor about going back to your normal activities.  Ask your doctor what activities are safe.  Protect your cut from the sun when you are outside for the first 6 months, or for as long as told by your doctor. Put on sunscreen around the scar or cover up the scar.  Keep all follow-up visits as told by your doctor. This is important. Contact a doctor if:  Your have more redness, swelling, or pain around the cut.  You  have more fluid or blood coming from the cut.  Your cut feels warm to the touch.  You have pus or a bad smell coming from the cut.  You have a fever or shaking chills.  You feel sick to your stomach (nauseous) or you throw up (vomit).  You are dizzy.  Your stitches or staples come undone. Get help right away if:  You have a red streak coming from your cut.  Your cut bleeds through the bandage and the bleeding does not stop with gentle pressure.  The edges of your cut open up and separate.  You have very bad (severe) pain.  You have a rash.  You are confused.  You pass out (faint).  You have trouble breathing and you have a fast heartbeat. This information is not intended to replace advice given to you by your health care provider. Make sure you discuss any questions you have with your health care provider. Document Released: 06/19/2011 Document Revised: 12/03/2015 Document Reviewed: 12/03/2015 Elsevier Interactive Patient Education  2017 [employer]  Scrotal Swelling Scrotal swelling may occur on one or both sides of the scrotum. Pain may also occur with swelling. Possible causes of scrotal swelling include:  Injury.  Infection.  An ingrown hair or abrasion in the area.  Repeated rubbing from tight-fitting underwear.  Poor hygiene.  A weakened area in the muscles around the groin (hernia). A hernia can allow abdominal contents to push into the scrotum.  Fluid around the testicle (hydrocele).  Enlarged vein around the testicle (varicocele).  Certain medical treatments or existing conditions.  A recent genital surgery or procedure.  The spermatic cord becomes twisted in the scrotum, which cuts off blood supply (testicular torsion).  Testicular cancer. Follow these instructions at home: Once the cause of your scrotal swelling has been determined, you may be asked to monitor your scrotum for any changes. The following actions may help to alleviate any  discomfort you are experiencing:  Rest and limit activity until the swelling goes away. Lying down is the preferred position.  Put ice on the scrotum:  Put ice in a plastic bag.  Place a towel between your skin and the bag.  Leave the ice on for 20 minutes, 2-3 times a day for 1-2 days.  Place a rolled towel under the testicles for support.  Wear loose-fitting clothing or an athletic support cup for comfort.  Take all medicines as directed by your health care provider.  Perform a monthly self-exam of the scrotum and penis. Feel for changes. Ask your health care provider how to perform a monthly self-exam if you are unsure. Contact a health care provider if:  You have a sudden (acute) onset of pain that is persistent and not improving.  You notice a heavy feeling or fluid in the scrotum.  You have pain or burning while urinating.  You have blood in the urine or semen.  You feel a lump around the testicle.  You notice that one testicle is larger than the other (slight variation is normal).  You have a persistent dull ache or pain in the groin or scrotum. Get help right away if:  The pain does not go away or becomes severe.  You have a fever or shaking chills.  You have pain or vomiting that cannot be controlled.  You notice significant redness or swelling of one or both sides of the scrotum.  You experience redness spreading upward from your scrotum to your abdomen or downward from your scrotum to your thighs. This information is not intended to replace advice given to you by your health care provider. Make sure you discuss any questions you have with your health care provider. Document Released: 04/29/2010 Document Revised: 10/15/2015 Document Reviewed: 08/29/2012 Elsevier Interactive Patient Education  2017 [employer]. PATIENT INSTRUCTIONS POST-ANESTHESIA  IMMEDIATELY FOLLOWING SURGERY:  Do not drive or operate machinery for the first twenty four hours after  surgery.  Do not make any important decisions for twenty four hours after surgery or while taking narcotic pain medications or sedatives.  If you develop intractable nausea and vomiting or a severe headache please notify your doctor immediately.  FOLLOW-UP:  Please make an appointment with your surgeon as instructed. You do not need to follow up with anesthesia unless specifically instructed to do so.  WOUND CARE INSTRUCTIONS (if applicable):  Keep a dry clean dressing on the anesthesia/puncture wound site if there is drainage.  Once the wound has quit draining you may leave it open to air.  Generally you should leave the bandage intact for twenty four hours unless there is drainage.  If the epidural site drains for more than 36-48 hours please call the anesthesia department.  QUESTIONS?:  Please feel free to call your physician or the hospital operator if you have any questions, and they will be happy to assist you.

## 2016-06-20 ENCOUNTER — Encounter (HOSPITAL_COMMUNITY)
Admission: RE | Admit: 2016-06-20 | Discharge: 2016-06-20 | Disposition: A | Discharge status: Home / Self Care | Payer: BLUE CROSS/BLUE SHIELD | Source: Ambulatory Visit | Attending: Urology | Admitting: Urology

## 2016-06-22 ENCOUNTER — Encounter (HOSPITAL_COMMUNITY)
Admission: RE | Admit: 2016-06-22 | Discharge: 2016-06-22 | Disposition: A | Discharge status: Home / Self Care | Payer: BLUE CROSS/BLUE SHIELD | Source: Ambulatory Visit | Attending: Urology | Admitting: Urology

## 2016-06-22 ENCOUNTER — Encounter (HOSPITAL_COMMUNITY): Payer: Self-pay

## 2016-06-22 DIAGNOSIS — K219 Gastro-esophageal reflux disease without esophagitis: Secondary | ICD-10-CM | POA: Diagnosis not present

## 2016-06-22 DIAGNOSIS — Z87891 Personal history of nicotine dependence: Secondary | ICD-10-CM | POA: Diagnosis not present

## 2016-06-22 DIAGNOSIS — D2922 Benign neoplasm of left testis: Secondary | ICD-10-CM | POA: Diagnosis not present

## 2016-06-22 DIAGNOSIS — N5089 Other specified disorders of the male genital organs: Secondary | ICD-10-CM | POA: Diagnosis present

## 2016-06-22 LAB — CBC WITH DIFFERENTIAL/PLATELET
BASOS ABS: 0.1 10*3/uL (ref 0.0–0.1)
Basophils Relative: 1 %
Eosinophils Absolute: 0.2 10*3/uL (ref 0.0–0.7)
Eosinophils Relative: 2 %
HEMATOCRIT: 45.1 % (ref 39.0–52.0)
Hemoglobin: 14.9 g/dL (ref 13.0–17.0)
LYMPHS ABS: 2.1 10*3/uL (ref 0.7–4.0)
LYMPHS PCT: 28 %
MCH: 28.6 pg (ref 26.0–34.0)
MCHC: 33 g/dL (ref 30.0–36.0)
MCV: 86.6 fL (ref 78.0–100.0)
MONO ABS: 0.6 10*3/uL (ref 0.1–1.0)
Monocytes Relative: 8 %
NEUTROS ABS: 4.5 10*3/uL (ref 1.7–7.7)
Neutrophils Relative %: 61 %
Platelets: 251 10*3/uL (ref 150–400)
RBC: 5.21 MIL/uL (ref 4.22–5.81)
RDW: 14.4 % (ref 11.5–15.5)
WBC: 7.5 10*3/uL (ref 4.0–10.5)

## 2016-06-22 LAB — BASIC METABOLIC PANEL
ANION GAP: 8 (ref 5–15)
BUN: 24 mg/dL — ABNORMAL HIGH (ref 6–20)
CHLORIDE: 103 mmol/L (ref 101–111)
CO2: 27 mmol/L (ref 22–32)
Calcium: 9.4 mg/dL (ref 8.9–10.3)
Creatinine, Ser: 1.21 mg/dL (ref 0.61–1.24)
GFR calc Af Amer: >60 mL/min (ref >60)
GLUCOSE: 105 mg/dL — AB (ref 65–99)
POTASSIUM: 3.9 mmol/L (ref 3.5–5.1)
Sodium: 138 mmol/L (ref 135–145)

## 2016-06-22 NOTE — H&P (Signed)
CC: I have swelling in my scrotum.  HPI: Curtis Trevino is a 51 year-old male patient who was referred by Dr. Sharilyn Sites, MD who is here for scrotal swelling.  The mass is on the left side. He noticed his testicular mass 5 months. He does have testicular pain on the side of his mass.   Curtis Trevino is a 51 yo WM who is sent in consultation by Dr. Hilma Favors for a left scrotal mass that was first noted in September. He had an [place] which showed a 65mm mass off of the lower pole of the testicle but separate from the testicle that had benign characteristics. He feels like someone is constantly pressing on the left testicle. He has some discomfort and pain. He has had no treatment for this. He has no other GU history.      ALLERGIES: Codeine    MEDICATIONS: Excedrin Migraine  Prilosec Otc     GU PSH: None   NON-GU PSH: Cholecystectomy (laparoscopic) - 2015    GU PMH: None   NON-GU PMH: Arthritis Depression GERD Hypercholesterolemia    FAMILY HISTORY: nephrolithiasis - Runs in Family renal cancer - Runs in Family   SOCIAL HISTORY: Marital Status: Single Current Smoking Status: Patient does not smoke anymore. Has not smoked since 05/11/2012. Smoked for 20 years. Smoked 1 pack per day.   Tobacco Use Assessment Completed: Used Tobacco in last 30 days? Drinks 1 caffeinated drink per day. Patient's occupation [employer]in [employer]    REVIEW OF SYSTEMS:    GU Review Male:   Patient denies frequent urination, hard to postpone urination, burning/ pain with urination, get up at night to urinate, leakage of urine, stream starts and stops, trouble starting your stream, have to strain to urinate , erection problems, and penile pain.  Gastrointestinal (Upper):   Patient reports indigestion/ heartburn. Patient denies nausea and vomiting.  Gastrointestinal (Lower):   Patient denies diarrhea and constipation.  Constitutional:   Patient denies fever, night sweats, weight loss, and fatigue.  Skin:    Patient denies skin rash/ lesion and itching.  Eyes:   Patient denies blurred vision and double vision.  Ears/ Nose/ Throat:   Patient denies sinus problems and sore throat.  Hematologic/Lymphatic:   Patient denies swollen glands and easy bruising.  Cardiovascular:   Patient denies leg swelling and chest pains.  Respiratory:   Patient denies cough and shortness of breath.  Endocrine:   Patient denies excessive thirst.  Musculoskeletal:   Patient reports back pain and joint pain.   Neurological:   Patient denies headaches and dizziness.  Psychologic:   Patient denies depression and anxiety.   VITAL SIGNS:      05/26/2016 08:57 AM  Weight 166 lb / 75.3 kg  Height 69 in / 175.26 cm  BP 114/83 mmHg  Pulse 92 /min  Temperature 98.2 F / 37 C  BMI 24.5 kg/m   GU PHYSICAL EXAMINATION:    Scrotum: No lesions. No edema. No cysts. No warts.  Epididymides: Left: left tail contains < 1 cm mass. Right: No spermatocele, no masses, no cysts, no tenderness, no induration, no enlargement. Left: No spermatocele, no cysts, no tenderness, no induration, no enlargement.   Testes: No tenderness, no swelling, no enlargement left testes. No tenderness, no swelling, no enlargement right testes. Normal location left testes. Normal location right testes. No mass, no cyst, no varicocele, no hydrocele left testes. No mass, no cyst, no varicocele, no hydrocele right testes.  Urethral Meatus: Normal size. No  lesion, no wart, no discharge, no polyp. Normal location.  Penis: Circumcised, no warts, no cracks. No dorsal Peyronie's plaques, no left corporal Peyronie's plaques, no right corporal Peyronie's plaques, no scarring, no warts. No balanitis, no meatal stenosis.   MULTI-SYSTEM PHYSICAL EXAMINATION:    Constitutional: Well-nourished. No physical deformities. Normally developed. Good grooming.  Neck: Neck symmetrical, not swollen. Normal tracheal position.  Respiratory: No labored breathing, no use of accessory  muscles. CTA  Cardiovascular: Normal temperature, RRR without murmur  Lymphatic: No enlargement of neck, axillae, groin.  Skin: No paleness, no jaundice, no cyanosis. No lesion, no ulcer, no rash.  Neurologic / Psychiatric: Oriented to time, oriented to place, oriented to person. No depression, no anxiety, no agitation.  Gastrointestinal: No hernia. No mass, no tenderness, no rigidity, non obese abdomen.   Musculoskeletal: Normal gait and station of head and neck.     PAST DATA REVIEWED:  Source Of History:  Patient  Records Review:   Previous Doctor Records  Urine Test Review:   Urinalysis  X-Ray Review: Scrotal Ultrasound: Reviewed Films. Reviewed Report. Discussed With Patient.    Notes:                     Records from Dr. Hilma Favors reviewed.    PROCEDURES:          Urinalysis - 81003 Dipstick Dipstick Cont'd  Specimen: Voided Bilirubin: Neg  Color: Yellow Ketones: Neg  Appearance: Clear Blood: Neg  Specific Gravity: >= 1.030 Protein: Neg  pH: 5.0 Urobilinogen: 0.2  Glucose: Neg Nitrites: Neg    Leukocyte Esterase: Neg    ASSESSMENT:      ICD-10 Details  1 GU:   Benign Neo Left epididymis - D29.32 Left, He has a 96mm left epididymal mass that was solid on [place] but appeared benign. He is symptomatic and would like it removed. I will set him up for a scrotal exploration and removal of the mass. I reviewed the risks of bleeding, infection, injury to the testicle, fertility issues, need for secondary procedures depending on the path, thrombotic events and anesthetic complications.    PLAN:           Schedule Return Visit/Planned Activity: Next Available Appointment - Schedule Surgery

## 2016-06-23 ENCOUNTER — Encounter (HOSPITAL_COMMUNITY)
Admission: RE | Disposition: A | Discharge status: Home / Self Care | Payer: Self-pay | Source: Ambulatory Visit | Attending: Urology

## 2016-06-23 ENCOUNTER — Ambulatory Visit (HOSPITAL_COMMUNITY): Payer: BLUE CROSS/BLUE SHIELD | Admitting: Anesthesiology

## 2016-06-23 ENCOUNTER — Ambulatory Visit (HOSPITAL_COMMUNITY)
Admission: RE | Admit: 2016-06-23 | Discharge: 2016-06-23 | Disposition: A | Discharge status: Home / Self Care | Payer: BLUE CROSS/BLUE SHIELD | Source: Ambulatory Visit | Attending: Urology | Admitting: Urology

## 2016-06-23 ENCOUNTER — Encounter (HOSPITAL_COMMUNITY): Payer: Self-pay | Admitting: *Deleted

## 2016-06-23 DIAGNOSIS — D2922 Benign neoplasm of left testis: Secondary | ICD-10-CM | POA: Insufficient documentation

## 2016-06-23 DIAGNOSIS — Z87891 Personal history of nicotine dependence: Secondary | ICD-10-CM | POA: Insufficient documentation

## 2016-06-23 DIAGNOSIS — D4012 Neoplasm of uncertain behavior of left testis: Secondary | ICD-10-CM | POA: Diagnosis not present

## 2016-06-23 DIAGNOSIS — K219 Gastro-esophageal reflux disease without esophagitis: Secondary | ICD-10-CM | POA: Insufficient documentation

## 2016-06-23 HISTORY — PX: SCROTAL EXPLORATION: SHX2386

## 2016-06-23 SURGERY — EXPLORATION, SCROTUM
Anesthesia: General | Laterality: Left

## 2016-06-23 MED ORDER — MIDAZOLAM HCL 2 MG/2ML IJ SOLN
1.0000 mg | INTRAMUSCULAR | Status: AC
  Administered 2016-06-23: 2 mg via INTRAVENOUS

## 2016-06-23 MED ORDER — FENTANYL CITRATE (PF) 100 MCG/2ML IJ SOLN: 25.0000 ug | INTRAMUSCULAR | Status: DC | PRN

## 2016-06-23 MED ORDER — OXYCODONE HCL 5 MG PO TABS
5.0000 mg | ORAL_TABLET | ORAL | Status: DC | PRN
  Administered 2016-06-23: 10 mg via ORAL
  Filled 2016-06-23: qty 2

## 2016-06-23 MED ORDER — LIDOCAINE HCL (PF) 1 % IJ SOLN
INTRAMUSCULAR | Status: AC
  Filled 2016-06-23: qty 5

## 2016-06-23 MED ORDER — FENTANYL CITRATE (PF) 250 MCG/5ML IJ SOLN
INTRAMUSCULAR | Status: AC
  Filled 2016-06-23: qty 5

## 2016-06-23 MED ORDER — LACTATED RINGERS IV SOLN
INTRAVENOUS | Status: DC
  Administered 2016-06-23: 1000 mL via INTRAVENOUS
  Administered 2016-06-23: 13:00:00 via INTRAVENOUS

## 2016-06-23 MED ORDER — FENTANYL CITRATE (PF) 100 MCG/2ML IJ SOLN
INTRAMUSCULAR | Status: DC | PRN
  Administered 2016-06-23 (×5): 50 ug via INTRAVENOUS

## 2016-06-23 MED ORDER — SODIUM CHLORIDE 0.9% FLUSH: 3.0000 mL | INTRAVENOUS | Status: DC | PRN

## 2016-06-23 MED ORDER — FENTANYL CITRATE (PF) 100 MCG/2ML IJ SOLN
25.0000 ug | INTRAMUSCULAR | Status: AC
  Administered 2016-06-23 (×2): 25 ug via INTRAVENOUS

## 2016-06-23 MED ORDER — BUPIVACAINE HCL (PF) 0.25 % IJ SOLN
INTRAMUSCULAR | Status: AC
  Filled 2016-06-23: qty 30

## 2016-06-23 MED ORDER — PROPOFOL 10 MG/ML IV BOLUS
INTRAVENOUS | Status: DC | PRN
  Administered 2016-06-23: 30 mg via INTRAVENOUS
  Administered 2016-06-23: 150 mg via INTRAVENOUS

## 2016-06-23 MED ORDER — CEFAZOLIN SODIUM-DEXTROSE 2-4 GM/100ML-% IV SOLN
2.0000 g | INTRAVENOUS | Status: AC
  Administered 2016-06-23: 2 g via INTRAVENOUS
  Filled 2016-06-23: qty 100

## 2016-06-23 MED ORDER — MIDAZOLAM HCL 2 MG/2ML IJ SOLN
INTRAMUSCULAR | Status: AC
  Filled 2016-06-23: qty 2

## 2016-06-23 MED ORDER — LIDOCAINE HCL (CARDIAC) 20 MG/ML IV SOLN
INTRAVENOUS | Status: DC | PRN
  Administered 2016-06-23: 50 mg via INTRAVENOUS

## 2016-06-23 MED ORDER — SODIUM CHLORIDE 0.9 % IV SOLN: 250.0000 mL | INTRAVENOUS | Status: DC | PRN

## 2016-06-23 MED ORDER — SODIUM CHLORIDE 0.9% FLUSH: 3.0000 mL | Freq: Two times a day (BID) | INTRAVENOUS | Status: DC

## 2016-06-23 MED ORDER — TRAMADOL HCL 50 MG PO TABS: 50.0000 mg | ORAL_TABLET | Freq: Four times a day (QID) | ORAL | 0 refills | Status: DC | PRN

## 2016-06-23 MED ORDER — FENTANYL CITRATE (PF) 100 MCG/2ML IJ SOLN
INTRAMUSCULAR | Status: AC
  Filled 2016-06-23: qty 2

## 2016-06-23 MED ORDER — BUPIVACAINE HCL (PF) 0.25 % IJ SOLN
INTRAMUSCULAR | Status: DC | PRN
  Administered 2016-06-23: 10 mL

## 2016-06-23 SURGICAL SUPPLY — 41 items
APPLICATOR COTTON TIP 6IN STRL (MISCELLANEOUS) ×3 IMPLANT
BENZOIN TINCTURE PRP APPL 2/3 (GAUZE/BANDAGES/DRESSINGS) IMPLANT
BNDG GAUZE ELAST 4 BULKY (GAUZE/BANDAGES/DRESSINGS) ×3 IMPLANT
BNDG GAUZE ROLL STR 2.25X3YD (GAUZE/BANDAGES/DRESSINGS) ×3 IMPLANT
CLOSURE WOUND 1/2 X4 (GAUZE/BANDAGES/DRESSINGS)
CLOTH BEACON ORANGE TIMEOUT ST (SAFETY) ×3 IMPLANT
DRAIN PENROSE 18X1/2 LTX STRL (DRAIN) ×3 IMPLANT
DRSG TEGADERM 4X4.75 (GAUZE/BANDAGES/DRESSINGS) IMPLANT
ELECT REM PT RETURN 9FT ADLT (ELECTROSURGICAL) ×3
ELECTRODE REM PT RTRN 9FT ADLT (ELECTROSURGICAL) ×1 IMPLANT
GAUZE SPONGE 4X4 12PLY STRL LF (GAUZE/BANDAGES/DRESSINGS) ×3 IMPLANT
GLOVE SURG SS PI 8.0 STRL IVOR (GLOVE) ×3 IMPLANT
GOWN STRL NON-REIN LRG LVL3 (GOWN DISPOSABLE) ×3 IMPLANT
GOWN STRL REIN XL XLG (GOWN DISPOSABLE) ×3 IMPLANT
INST SET MINOR GENERAL (KITS) ×3 IMPLANT
KIT ROOM TURNOVER APOR (KITS) ×3 IMPLANT
MANIFOLD NEPTUNE II (INSTRUMENTS) ×3 IMPLANT
NEEDLE HYPO 22GX1.5 SAFETY (NEEDLE) ×3 IMPLANT
NS IRRIG 500ML POUR BTL (IV SOLUTION) ×3 IMPLANT
PACK MINOR (CUSTOM PROCEDURE TRAY) ×3 IMPLANT
SET BASIN LINEN APH (SET/KITS/TRAYS/PACK) ×3 IMPLANT
STRIP CLOSURE SKIN 1/2X4 (GAUZE/BANDAGES/DRESSINGS) IMPLANT
SUPPORT SCROTAL LG STRP (MISCELLANEOUS) IMPLANT
SUPPORT SCROTAL MED ADLT STRP (MISCELLANEOUS) ×2 IMPLANT
SUPPORTER ATHLETIC LG (MISCELLANEOUS)
SUPPORTER ATHLETIC MED (MISCELLANEOUS) ×1
SUT CHROMIC 3 0 SH 27 (SUTURE) IMPLANT
SUT CHROMIC GUT AB #0 18 (SUTURE) IMPLANT
SUT PROLENE 2 0 CT2 30 (SUTURE) ×3 IMPLANT
SUT SILK 3 0 TIES 17X18 (SUTURE)
SUT SILK 3-0 18XBRD TIE BLK (SUTURE) IMPLANT
SUT VIC AB 3-0 SH 27 (SUTURE)
SUT VIC AB 3-0 SH 27X BRD (SUTURE) IMPLANT
SUT VIC AB 4-0 P-3 18XBRD (SUTURE) IMPLANT
SUT VIC AB 4-0 P3 18 (SUTURE)
SUT VICRYL 0 TIES 12 18 (SUTURE) IMPLANT
SUT VICRYL 4-0 PS2 18IN ABS (SUTURE) ×3 IMPLANT
SYR BULB IRRIGATION 50ML (SYRINGE) ×3 IMPLANT
SYR CONTROL 10ML LL (SYRINGE) ×3 IMPLANT
TOWEL OR 17X24 6PK STRL BLUE (TOWEL DISPOSABLE) ×6 IMPLANT
WATER STERILE IRR 500ML POUR (IV SOLUTION) IMPLANT

## 2016-06-23 NOTE — Brief Op Note (Signed)
06/23/2016  12:49 PM  PATIENT:  Curtis Trevino  51 y.o. male  PRE-OPERATIVE DIAGNOSIS:  left epididymal mass  POST-OPERATIVE DIAGNOSIS:  left testicular capsular mass  PROCEDURE:  Procedure(s): LEFT SCROTAL EXPLORATION WITH EXCISION OF TESTICULAR CAPSULAR MASS (Left)  SURGEON:  Surgeon(s) and Role:    * Irine Seal, MD - Primary  PHYSICIAN ASSISTANT:   ASSISTANTS: none   ANESTHESIA:   local and general  EBL:  Total I/O In: 700 [I.V.:700] Out: 0   BLOOD ADMINISTERED:none  DRAINS: none   LOCAL MEDICATIONS USED:  MARCAINE    and Amount: 9 ml 0.25%    SPECIMEN:  Source of Specimen:  left testicular capsular mass  DISPOSITION OF SPECIMEN:  PATHOLOGY  COUNTS:  YES  TOURNIQUET:  * No tourniquets in log *  DICTATION: .Other Dictation: Dictation Number [id number]  PLAN OF CARE: Discharge to home after PACU  PATIENT DISPOSITION:  PACU - hemodynamically stable.   Delay start of Pharmacological VTE agent (>24hrs) due to surgical blood loss or risk of bleeding: not applicable

## 2016-06-23 NOTE — Discharge Instructions (Addendum)
HOME CARE INSTRUCTIONS FOR SCROTAL PROCEDURES  Wound Care & Hygiene: You may apply an ice bag to the scrotum for the first 24 hours.  This may help decrease swelling and soreness.  You may have a dressing held in place by an athletic supporter.  You may remove the dressing in 24 hours and shower in 48 hours.  Continue to use the athletic supporter or tight briefs for at least a week. Activity: Rest today - not necessarily flat bed rest.  Just take it easy.  You should not do strenuous activities until your follow-up visit with your doctor.  You may resume light activity in 48 hours.  Return to Work:  5-7 days  Diet: Drink liquids or eat a light diet this evening.  You may resume a regular diet tomorrow.  General Expectations: You may have a small amount of bleeding.  The scrotum may be swollen or bruised for about a week.  Call your Doctor if these occur:  -persistent or heavy bleeding  -temperature of 101 degrees or more  -severe pain, not relieved by your pain medication    Patient Signature:  __________________________________________________  Nurse's Signature:  __________________________________________________     PATIENT INSTRUCTIONS POST-ANESTHESIA  IMMEDIATELY FOLLOWING SURGERY:  Do not drive or operate machinery for the first twenty four hours after surgery.  Do not make any important decisions for twenty four hours after surgery or while taking narcotic pain medications or sedatives.  If you develop intractable nausea and vomiting or a severe headache please notify your doctor immediately.  FOLLOW-UP:  Please make an appointment with your surgeon as instructed. You do not need to follow up with anesthesia unless specifically instructed to do so.  WOUND CARE INSTRUCTIONS (if applicable):  Keep a dry clean dressing on the anesthesia/puncture wound site if there is drainage.  Once the wound has quit draining you may leave it open to air.  Generally you should leave the  bandage intact for twenty four hours unless there is drainage.  If the epidural site drains for more than 36-48 hours please call the anesthesia department.  QUESTIONS?:  Please feel free to call your physician or the hospital operator if you have any questions, and they will be happy to assist you.

## 2016-06-23 NOTE — Interval H&P Note (Signed)
History and Physical Interval Note:  06/23/2016 11:59 AM  Curtis Trevino  has presented today for surgery, with the diagnosis of left epididymal mass  The various methods of treatment have been discussed with the patient and family. After consideration of risks, benefits and other options for treatment, the patient has consented to  Procedure(s): LEFT SCROTAL EXPLORATION WITH EXCISION OF EPIDIDYMAL MASS (Left) as a surgical intervention .  The patient's history has been reviewed, patient examined, no change in status, stable for surgery.  I have reviewed the patient's chart and labs.  Questions were answered to the patient's satisfaction.     Pasquale Matters J

## 2016-06-23 NOTE — Op Note (Signed)
NAME:  Curtis Trevino, Curtis Trevino NO.:  MEDICAL RECORD NO.:  [id number]  LOCATION:                                 FACILITY:  PHYSICIAN:  Marshall Cork. Jeffie Pollock, M.D.         DATE OF BIRTH:  DATE OF PROCEDURE:  06/23/2016 DATE OF DISCHARGE:                              OPERATIVE REPORT   PROCEDURE:  Left scrotal exploration with excision of left testicular capsular mass.  PREOPERATIVE DIAGNOSIS:  Left epididymal mass.  POSTOPERATIVE DIAGNOSIS:  Left testicular capsular mass.  SURGEON:  Marshall Cork. Jeffie Pollock, M.D.  ANESTHESIA:  General with local.  SPECIMEN:  A 5-mm left lower pole testicular capsular mass.  BLOOD LOSS:  Minimal.  DRAINS:  None.  COMPLICATIONS:  None.  INDICATIONS:  Mr. Lambson is a 51 year old white male who has a small mass in the left scrotum on the inferior pole of the testicle/epididymis, and he is to undergo excision of this mass.  FINDINGS AND PROCEDURE:  He was given Ancef.  He was taken to the operating room where general anesthetic was induced.  He was placed in the supine position.  His scrotum was clipped.  He was prepped with Betadine solution and draped in usual sterile fashion.  A left anterior oblique scrotal incision was made with a knife and carried through the dartos and tunica vaginalis with the Bovie.  The testicle was then delivered from the wound.  On inspection, there was a small mass approximately 5 to 6 mm on the inferior pole of the testicle where the epididymis was adherent.  A Bovie was then used to excise the serosa and the epididymis came free revealing that the mass was actually within the testicular capsule.  The Bovie was then used to excise the mass, which did appear to be a capsular lesion and not an intra-testicular lesion, but the seminiferous tubules were exposed, but they were not adherent to the mass.  At this point, it was felt that simple excision was indicated, and he did not need an orchiectomy.  However,  further resection may be required depending on the pathology.  Hemostasis was achieved with the Bovie with a small capsular bleeder, and the testicular capsule was then closed using a running 4-0 Vicryl suture.  Once hemostasis was achieved and the testicle was closed, the testicle was returned to the tunica vaginalis and replaced in the scrotum.  The dartos was closed with a running 3-0 chromic, and the skin was closed with a running vertical mattress 3-0 chromic.  A dressing of 4x4s, fluff, Kerlix, and a scrotal support was applied.  The patient's anesthetic was reversed, and he was moved to recovery room in stable condition.  There were no complications.     Marshall Cork. Jeffie Pollock, M.D.     JJW/MEDQ  D:  06/23/2016  T:  06/23/2016  Job:  [id number]

## 2016-06-23 NOTE — Anesthesia Postprocedure Evaluation (Signed)
Anesthesia Post Note  Patient: Curtis Trevino  Procedure(s) Performed: Procedure(s) (LRB): LEFT SCROTAL EXPLORATION WITH EXCISION OF EPIDIDYMAL MASS (Left)  Patient location during evaluation: PACU Anesthesia Type: General Level of consciousness: awake and alert and oriented Pain management: pain level controlled Vital Signs Assessment: post-procedure vital signs reviewed and stable Respiratory status: spontaneous breathing Cardiovascular status: stable Postop Assessment: no signs of nausea or vomiting Anesthetic complications: no     Last Vitals:  Vitals:   06/23/16 1330 06/23/16 1345  BP: 117/86 112/87  Pulse: 89 94  Resp: (!) 8 20  Temp:      Last Pain:  Vitals:   06/23/16 1345  TempSrc:   PainSc: Asleep                 [name]

## 2016-06-23 NOTE — Anesthesia Procedure Notes (Signed)
Procedure Name: LMA Insertion Date/Time: 06/23/2016 12:15 PM Performed by: Vista Deck Pre-anesthesia Checklist: Patient identified, Patient being monitored, Emergency Drugs available, Timeout performed and Suction available Patient Re-evaluated:Patient Re-evaluated prior to inductionOxygen Delivery Method: Circle System Utilized Preoxygenation: Pre-oxygenation with 100% oxygen Intubation Type: IV induction Ventilation: Mask ventilation without difficulty LMA: LMA inserted LMA Size: 4.0 Number of attempts: 1 Placement Confirmation: positive ETCO2 and breath sounds checked- equal and bilateral Tube secured with: Tape Dental Injury: Teeth and Oropharynx as per pre-operative assessment

## 2016-06-23 NOTE — Transfer of Care (Signed)
Immediate Anesthesia Transfer of Care Note  Patient: Curtis Trevino  Procedure(s) Performed: Procedure(s): LEFT SCROTAL EXPLORATION WITH EXCISION OF EPIDIDYMAL MASS (Left)  Patient Location: PACU  Anesthesia Type:MAC  Level of Consciousness: awake and patient cooperative  Airway & Oxygen Therapy: Patient Spontanous Breathing and Patient connected to nasal cannula oxygen  Post-op Assessment: Report given to RN and Post -op Vital signs reviewed and stable  Post vital signs: Reviewed and stable  Last Vitals:  Vitals:   06/23/16 1145 06/23/16 1200  BP: 110/80 121/85  Pulse:    Resp: 20 (!) 48  Temp:      Last Pain:  Vitals:   06/23/16 1130  TempSrc:   PainSc: 2       Patients Stated Pain Goal: 5 (74/82/[id number])  Complications: No apparent anesthesia complications

## 2016-06-23 NOTE — Anesthesia Preprocedure Evaluation (Signed)
Anesthesia Evaluation  Patient identified by MRN, date of birth, ID band Patient awake    Reviewed: Allergy & Precautions, H&P , NPO status , Patient's Chart, lab work & pertinent test results  Airway Mallampati: I  TM Distance: >3 FB     Dental  (+) Teeth Intact   Pulmonary former smoker,    breath sounds clear to auscultation       Cardiovascular negative cardio ROS   Rhythm:Regular Rate:Normal     Neuro/Psych  Headaches,    GI/Hepatic GERD  Medicated and Controlled,  Endo/Other    Renal/GU      Musculoskeletal   Abdominal   Peds  Hematology   Anesthesia Other Findings   Reproductive/Obstetrics                             Anesthesia Physical Anesthesia Plan  ASA: II  Anesthesia Plan: General   Post-op Pain Management:    Induction: Intravenous  Airway Management Planned: LMA  Additional Equipment:   Intra-op Plan:   Post-operative Plan: Extubation in OR  Informed Consent: I have reviewed the patients History and Physical, chart, labs and discussed the procedure including the risks, benefits and alternatives for the proposed anesthesia with the patient or authorized representative who has indicated his/her understanding and acceptance.     Plan Discussed with:   Anesthesia Plan Comments:         Anesthesia Quick Evaluation

## 2016-06-26 ENCOUNTER — Encounter (HOSPITAL_COMMUNITY): Payer: Self-pay | Admitting: Urology

## 2016-07-12 ENCOUNTER — Encounter (HOSPITAL_COMMUNITY): Payer: Self-pay | Admitting: Family Medicine

## 2016-07-12 ENCOUNTER — Ambulatory Visit (HOSPITAL_COMMUNITY)
Admission: EM | Admit: 2016-07-12 | Discharge: 2016-07-12 | Disposition: A | Discharge status: Home / Self Care | Payer: BLUE CROSS/BLUE SHIELD | Attending: Internal Medicine | Admitting: Internal Medicine

## 2016-07-12 DIAGNOSIS — J029 Acute pharyngitis, unspecified: Secondary | ICD-10-CM

## 2016-07-12 DIAGNOSIS — J020 Streptococcal pharyngitis: Secondary | ICD-10-CM | POA: Diagnosis not present

## 2016-07-12 LAB — POCT RAPID STREP A: STREPTOCOCCUS, GROUP A SCREEN (DIRECT): POSITIVE — AB

## 2016-07-12 MED ORDER — LIDOCAINE VISCOUS 2 % MT SOLN: 20.0000 mL | OROMUCOSAL | 0 refills | Status: DC | PRN

## 2016-07-12 MED ORDER — IPRATROPIUM BROMIDE 0.06 % NA SOLN: 2.0000 | Freq: Four times a day (QID) | NASAL | 0 refills | Status: DC

## 2016-07-12 MED ORDER — AMOXICILLIN 875 MG PO TABS: 875.0000 mg | ORAL_TABLET | Freq: Two times a day (BID) | ORAL | 0 refills | Status: DC

## 2016-07-12 NOTE — ED Triage Notes (Signed)
Pt here for sore throat, diarrhea, aches and fever.

## 2016-07-12 NOTE — ED Provider Notes (Signed)
CSN: [id number]     Arrival date & time 07/12/16  1037 History   None    Chief Complaint  Patient presents with  . Diarrhea  . Fever  . Sore Throat   (Consider location/radiation/quality/duration/timing/severity/associated sxs/prior Treatment) Patient c/o sore throat and fever for one day.  He c/o diarrhea for 2 days   The history is provided by the patient.  Diarrhea  Quality:  Mucous Onset quality:  Sudden Timing:  Constant Progression:  Improving Associated symptoms: fever   Fever  Associated symptoms: diarrhea and sore throat   Sore Throat  This is a new problem. The current episode started yesterday. The problem occurs constantly. The problem has not changed since onset.Nothing aggravates the symptoms. Nothing relieves the symptoms.    Past Medical History:  Diagnosis Date  . Acid reflux   . Chronic back pain    Past Surgical History:  Procedure Laterality Date  . CHOLECYSTECTOMY N/A 11/18/2013   Procedure: LAPAROSCOPIC CHOLECYSTECTOMY;  Surgeon: Scherry Ran, MD;  Location: [facility];  Service: General;  Laterality: N/A;  . ESOPHAGOGASTRODUODENOSCOPY  2005  . SCROTAL EXPLORATION Left 06/23/2016   Procedure: LEFT SCROTAL EXPLORATION WITH EXCISION OF EPIDIDYMAL MASS;  Surgeon: Irine Seal, MD;  Location: [facility];  Service: Urology;  Laterality: Left;   History reviewed. No pertinent family history. Social History  Substance Use Topics  . Smoking status: Former Smoker    Quit date: 11/15/2010  . Smokeless tobacco: Never Used  . Alcohol use 1.8 oz/week    3 Shots of liquor per week     Comment: 3 drink a day    Review of Systems  Constitutional: Positive for fatigue and fever.  HENT: Positive for sore throat.   Eyes: Negative.   Respiratory: Negative.   Cardiovascular: Negative.   Gastrointestinal: Positive for diarrhea.  Endocrine: Negative.   Genitourinary: Negative.   Musculoskeletal: Negative.   Allergic/Immunologic: Negative.   Neurological: Negative.    Hematological: Negative.   Psychiatric/Behavioral: Negative.     Allergies  Tylenol [acetaminophen] and Codeine  Home Medications   Prior to Admission medications   Medication Sig Start Date End Date Taking? Authorizing Provider  amoxicillin (AMOXIL) 875 MG tablet Take 1 tablet (875 mg total) by mouth 2 (two) times daily. 07/12/16   Lysbeth Penner, FNP  aspirin-acetaminophen-caffeine (EXCEDRIN MIGRAINE) [phone] MG tablet Take 3-4 tablets by mouth every 8 (eight) hours as needed (for migraine headache.).    Historical Provider, MD  b complex vitamins tablet Take 1 tablet by mouth daily.    Historical Provider, MD  cholecalciferol (VITAMIN D) 400 units TABS tablet Take 400 Units by mouth daily.    Historical Provider, MD  ibuprofen (ADVIL,MOTRIN) 200 MG tablet Take 400-600 mg by mouth every 8 (eight) hours as needed (for migraine headaches.).    Historical Provider, MD  ipratropium (ATROVENT) 0.06 % nasal spray Place 2 sprays into both nostrils 4 (four) times daily. 07/12/16   Lysbeth Penner, FNP  lidocaine (XYLOCAINE) 2 % solution Use as directed 20 mLs in the mouth or throat as needed for mouth pain. 07/12/16   Lysbeth Penner, FNP  Multiple Vitamin (MULTIVITAMIN WITH MINERALS) TABS tablet Take 1 tablet by mouth daily.    Historical Provider, MD  omeprazole (PRILOSEC OTC) 20 MG tablet Take 20 mg by mouth at bedtime.     Historical Provider, MD  traMADol (ULTRAM) 50 MG tablet Take 1 tablet (50 mg total) by mouth every 6 (six) hours as needed  for moderate pain or severe pain. 06/23/16   Irine Seal, MD  vitamin C (ASCORBIC ACID) 500 MG tablet Take 1,000 mg by mouth daily.    Historical Provider, MD   Meds Ordered and Administered this Visit  Medications - No data to display  BP 107/72   Pulse (!) 115   Temp 99.3 F (37.4 C) Comment: after ibuprofen  Resp 18   SpO2 100%  No data found.   Physical Exam  Constitutional: He is oriented to person, place, and time. He appears  well-developed and well-nourished.  HENT:  Head: Normocephalic.  Right Ear: External ear normal.  Left Ear: External ear normal.  Opx erythematous and tonsils 2 plus bilateral  Eyes: Conjunctivae and EOM are normal. Pupils are equal, round, and reactive to light.  Neck: Normal range of motion. Neck supple.  Cardiovascular: Normal rate, regular rhythm and normal heart sounds.   Pulmonary/Chest: Effort normal and breath sounds normal.  Musculoskeletal: Normal range of motion.  Neurological: He is alert and oriented to person, place, and time.  Nursing note and vitals reviewed.   Urgent Care Course     Procedures (including critical care time)  Labs Review Labs Reviewed  POCT RAPID STREP A - Abnormal; Notable for the following:       Result Value   Streptococcus, Group A Screen (Direct) POSITIVE (*)    All other components within normal limits    Imaging Review No results found.   Visual Acuity Review  Right Eye Distance:   Left Eye Distance:   Bilateral Distance:    Right Eye Near:   Left Eye Near:    Bilateral Near:         MDM   1. Sore throat   2. Strep pharyngitis    Amoxicillin 875mg  one po bid x 7 days #14 Lidocaine Solution 3ml po qid  Push po fluids, rest, tylenol and motrin otc prn as directed for fever, arthralgias, and myalgias.  Follow up prn if sx's continue or persist.    Lysbeth Penner, FNP 07/12/16 1240

## 2016-07-21 ENCOUNTER — Ambulatory Visit (INDEPENDENT_AMBULATORY_CARE_PROVIDER_SITE_OTHER): Payer: Self-pay | Admitting: Urology

## 2016-07-21 DIAGNOSIS — N5201 Erectile dysfunction due to arterial insufficiency: Secondary | ICD-10-CM

## 2016-07-21 DIAGNOSIS — D2932 Benign neoplasm of left epididymis: Secondary | ICD-10-CM

## 2017-02-19 IMAGING — US US SCROTUM
1 series · 13 of 25 positions shown · non-contrast
Comparison: None.

CLINICAL DATA: Left scrotal pain with lump since December 2015

EXAM:
SCROTAL ULTRASOUND
DOPPLER ULTRASOUND OF THE TESTICLES
TECHNIQUE: Complete ultrasound examination of the testicles, epididymis, and
other scrotal structures was performed. Color and spectral Doppler
ultrasound were also utilized to evaluate blood flow to the
testicles.

[Series 1: us scrotum · 0.06mm/px · 13 of 74 slices shown]
[im 1/74]
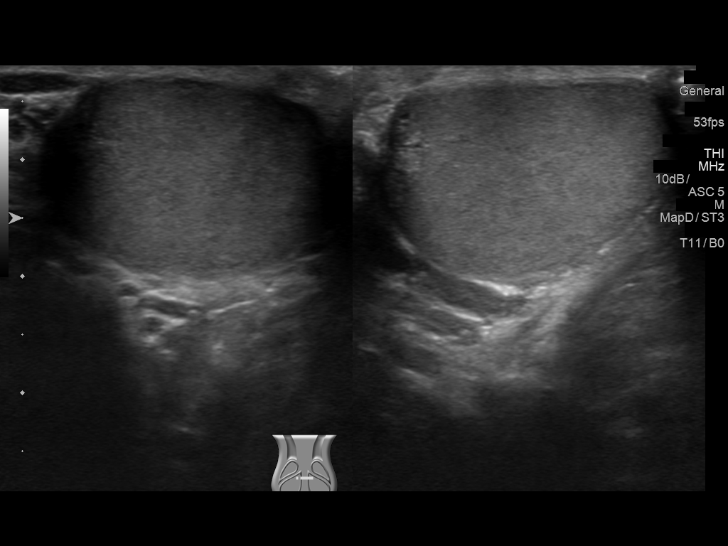
[im 7/74]
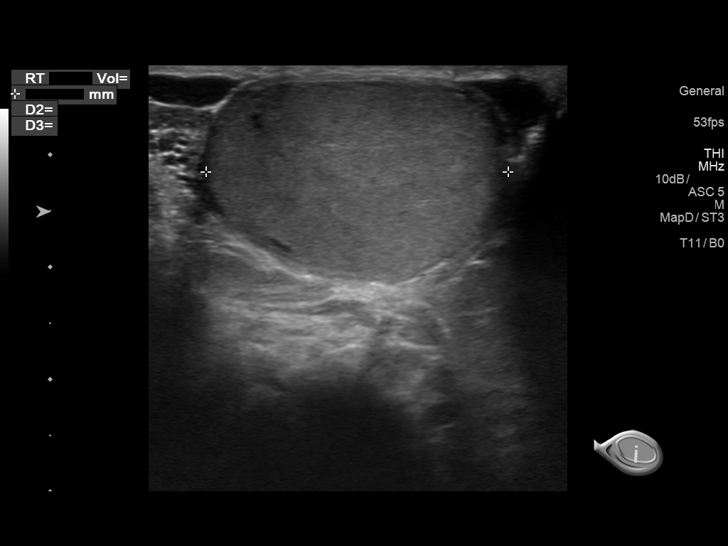
[im 13/74]
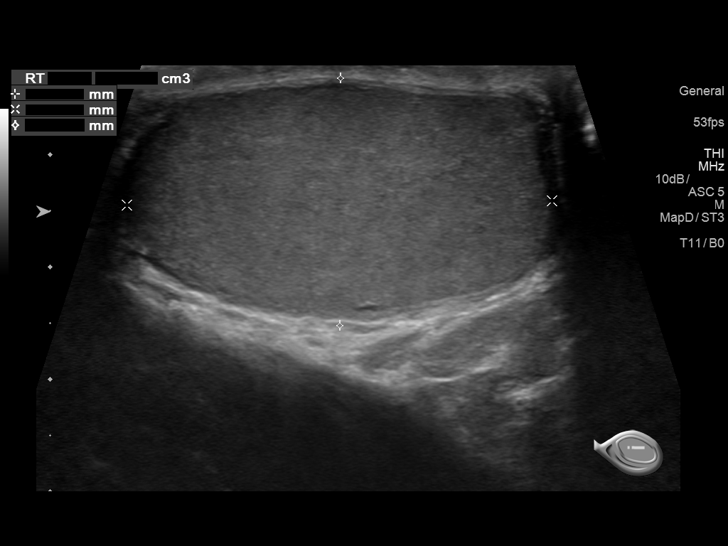
[im 19/74]
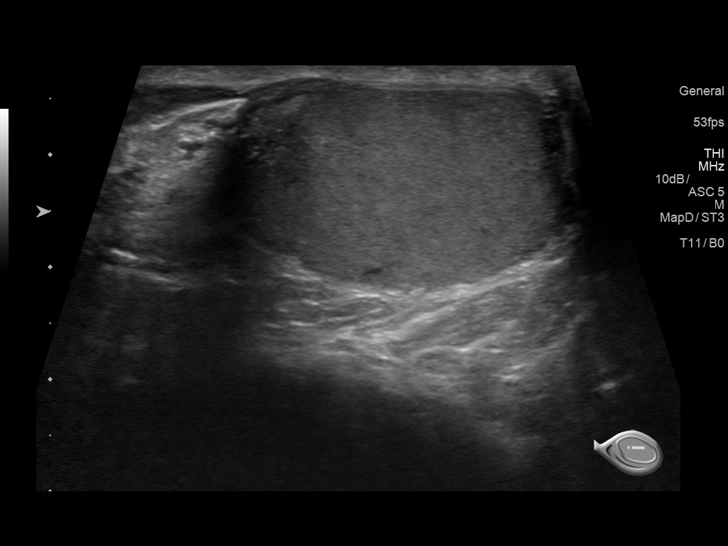
[im 25/74]
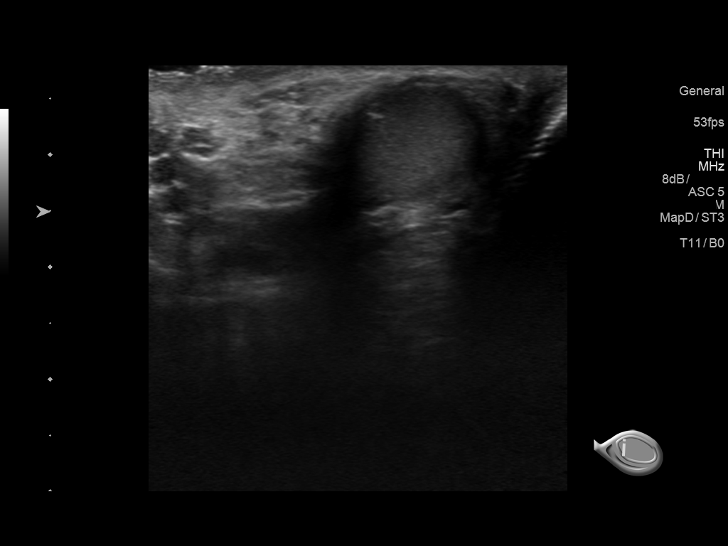
[im 31/74]
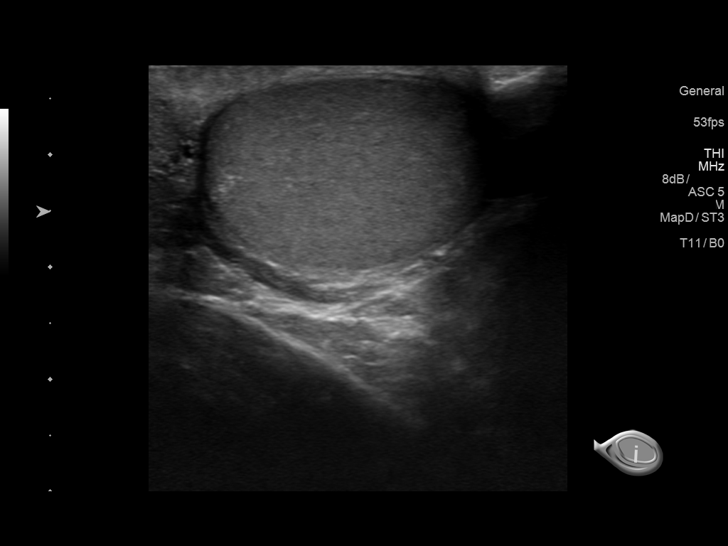
[im 37/74]
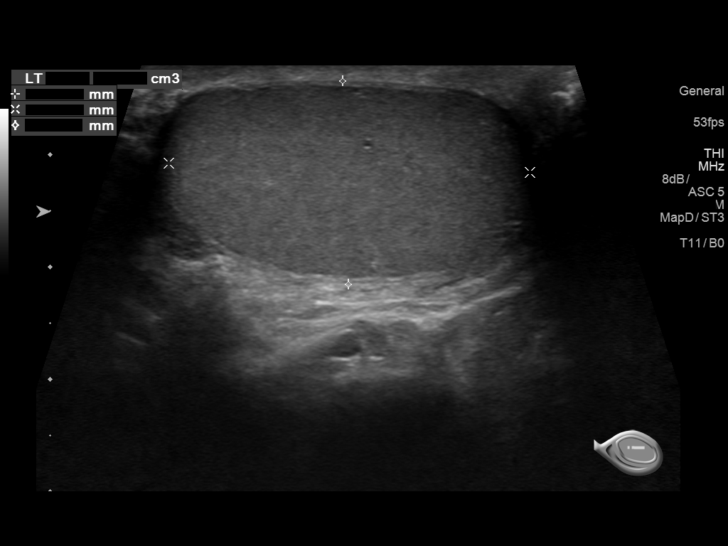
[im 43/74]
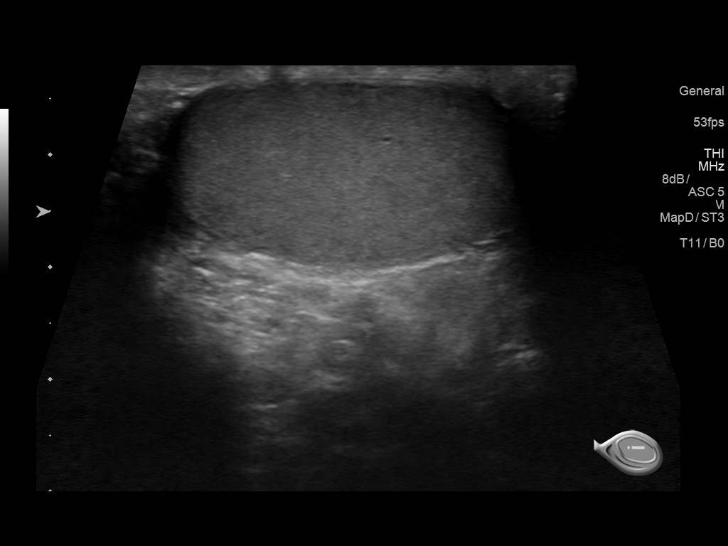
[im 49/74]
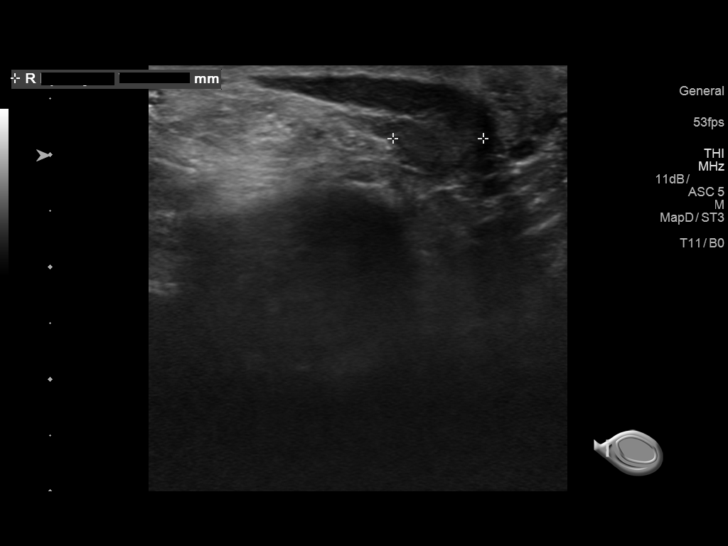
[im 55/74]
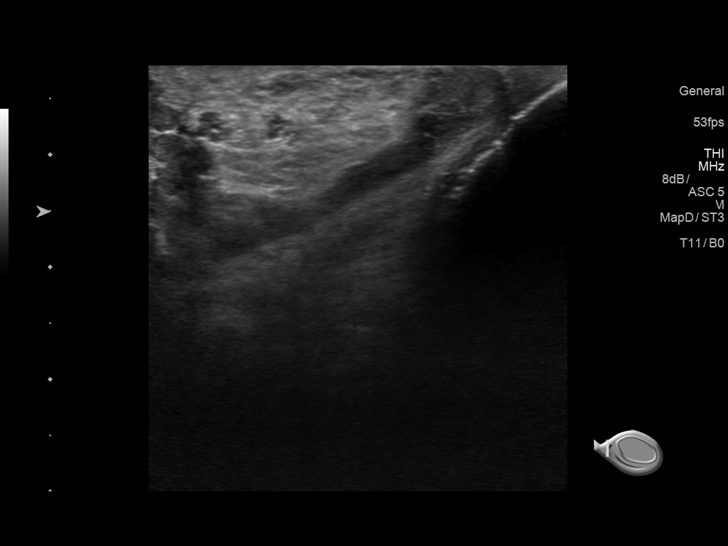
[im 61/74]
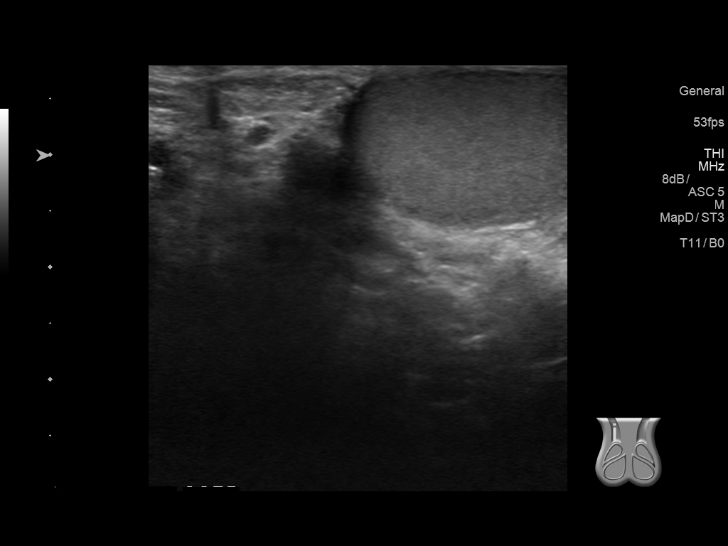
[im 67/74]
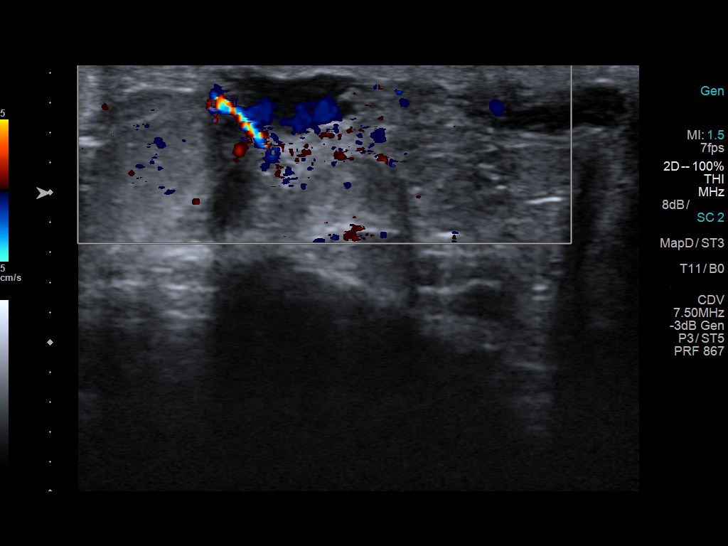
[im 74/74]
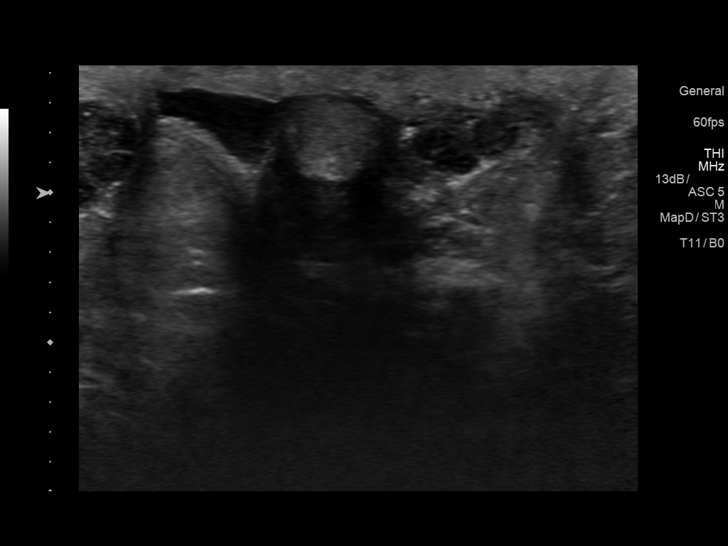

[13 of 25 positions shown; findings below may reference images not displayed]

FINDINGS: Right testicle

Measurements: 3.8 x 2.2 x 2.7 cm. No mass or microlithiasis
visualized.

Left testicle

Measurements: 3.2 x 1.8 x 2.6 cm. No mass or microlithiasis
visualized. There is a hyperechoic nodule along the inferior aspect
of the left testicle which appears to be separate from the left
distal, measuring 7 x 6 x 5 mm.

Right epididymis:  Normal in size and appearance.

Left epididymis:  Normal in size and appearance.

Hydrocele:  None visualized.

Varicocele:  None visualized.

Pulsed Doppler interrogation of both testes demonstrates normal low
resistance arterial and venous waveforms bilaterally.
IMPRESSION: No focal testicular abnormality or evidence of torsion.

7 mm hyperechoic nodule adjacent to the inferior pole of the left
testicle which appears to be separate from the left testicle. This
is of unknown etiology but has a benign appearance. This could be
followed with repeat ultrasound in 3-6 months to ensure stability.

## 2017-05-16 ENCOUNTER — Other Ambulatory Visit: Payer: Self-pay

## 2017-05-16 ENCOUNTER — Emergency Department (HOSPITAL_COMMUNITY): Payer: BLUE CROSS/BLUE SHIELD

## 2017-05-16 ENCOUNTER — Encounter (HOSPITAL_COMMUNITY): Payer: Self-pay

## 2017-05-16 DIAGNOSIS — R509 Fever, unspecified: Secondary | ICD-10-CM | POA: Diagnosis not present

## 2017-05-16 DIAGNOSIS — Z87891 Personal history of nicotine dependence: Secondary | ICD-10-CM | POA: Insufficient documentation

## 2017-05-16 DIAGNOSIS — Z79899 Other long term (current) drug therapy: Secondary | ICD-10-CM | POA: Insufficient documentation

## 2017-05-16 MED ORDER — IBUPROFEN 800 MG PO TABS
800.0000 mg | ORAL_TABLET | Freq: Once | ORAL | Status: AC
Start: 2017-05-16 — End: 2017-05-16
  Administered 2017-05-16: 800 mg via ORAL
  Filled 2017-05-16: qty 1

## 2017-05-16 NOTE — ED Triage Notes (Signed)
Fever onset today, generalized body aches, also having lower back pain, states has burning with urination started tonight.

## 2017-05-17 ENCOUNTER — Emergency Department (HOSPITAL_COMMUNITY)
Admission: EM | Admit: 2017-05-17 | Discharge: 2017-05-17 | Disposition: A | Discharge status: Home / Self Care | Payer: BLUE CROSS/BLUE SHIELD | Attending: Emergency Medicine | Admitting: Emergency Medicine

## 2017-05-17 DIAGNOSIS — R509 Fever, unspecified: Secondary | ICD-10-CM

## 2017-05-17 LAB — URINALYSIS, ROUTINE W REFLEX MICROSCOPIC
Bilirubin Urine: NEGATIVE
GLUCOSE, UA: NEGATIVE mg/dL
Hgb urine dipstick: NEGATIVE
Ketones, ur: NEGATIVE mg/dL
LEUKOCYTES UA: NEGATIVE
Nitrite: NEGATIVE
PROTEIN: NEGATIVE mg/dL
Specific Gravity, Urine: 1.011 (ref 1.005–1.030)
pH: 5 (ref 5.0–8.0)

## 2017-05-17 LAB — COMPREHENSIVE METABOLIC PANEL
ALBUMIN: 4.1 g/dL (ref 3.5–5.0)
ALT: 32 U/L (ref 17–63)
AST: 34 U/L (ref 15–41)
Alkaline Phosphatase: 60 U/L (ref 38–126)
Anion gap: 10 (ref 5–15)
BILIRUBIN TOTAL: 0.5 mg/dL (ref 0.3–1.2)
BUN: 18 mg/dL (ref 6–20)
CO2: 21 mmol/L — ABNORMAL LOW (ref 22–32)
Calcium: 9.5 mg/dL (ref 8.9–10.3)
Chloride: 104 mmol/L (ref 101–111)
Creatinine, Ser: 1.24 mg/dL (ref 0.61–1.24)
GFR calc Af Amer: >60 mL/min (ref >60)
GFR calc non Af Amer: >60 mL/min (ref >60)
Glucose, Bld: 134 mg/dL — ABNORMAL HIGH (ref 65–99)
POTASSIUM: 3.7 mmol/L (ref 3.5–5.1)
Sodium: 135 mmol/L (ref 135–145)
TOTAL PROTEIN: 8.5 g/dL — AB (ref 6.5–8.1)

## 2017-05-17 LAB — CBC WITH DIFFERENTIAL/PLATELET
BASOS ABS: 0.1 10*3/uL (ref 0.0–0.1)
Basophils Relative: 0 %
Eosinophils Absolute: 0 10*3/uL (ref 0.0–0.7)
Eosinophils Relative: 0 %
HEMATOCRIT: 41.9 % (ref 39.0–52.0)
HEMOGLOBIN: 13.2 g/dL (ref 13.0–17.0)
LYMPHS PCT: 9 %
Lymphs Abs: 1.5 10*3/uL (ref 0.7–4.0)
MCH: 26.7 pg (ref 26.0–34.0)
MCHC: 31.5 g/dL (ref 30.0–36.0)
MCV: 84.6 fL (ref 78.0–100.0)
Monocytes Absolute: 1.4 10*3/uL — ABNORMAL HIGH (ref 0.1–1.0)
Monocytes Relative: 8 %
NEUTROS PCT: 83 %
Neutro Abs: 14.8 10*3/uL — ABNORMAL HIGH (ref 1.7–7.7)
PLATELETS: 275 10*3/uL (ref 150–400)
RBC: 4.95 MIL/uL (ref 4.22–5.81)
RDW: 15.4 % (ref 11.5–15.5)
WBC: 17.8 10*3/uL — AB (ref 4.0–10.5)

## 2017-05-17 LAB — LACTIC ACID, PLASMA
Lactic Acid, Venous: 0.6 mmol/L (ref 0.5–1.9)
Lactic Acid, Venous: 1 mmol/L (ref 0.5–1.9)

## 2017-05-17 MED ORDER — OSELTAMIVIR PHOSPHATE 75 MG PO CAPS
75.0000 mg | ORAL_CAPSULE | Freq: Once | ORAL | Status: AC
  Administered 2017-05-17: 75 mg via ORAL
  Filled 2017-05-17: qty 1

## 2017-05-17 MED ORDER — SODIUM CHLORIDE 0.9 % IV BOLUS (SEPSIS)
1000.0000 mL | Freq: Once | INTRAVENOUS | Status: AC
  Administered 2017-05-17: 1000 mL via INTRAVENOUS

## 2017-05-17 MED ORDER — OSELTAMIVIR PHOSPHATE 75 MG PO CAPS: 75.0000 mg | ORAL_CAPSULE | Freq: Two times a day (BID) | ORAL | 0 refills | Status: DC

## 2017-05-17 NOTE — ED Provider Notes (Signed)
[facility] EMERGENCY DEPARTMENT Provider Note   CSN: [id number] Arrival date & time: 05/16/17  2302     History   Chief Complaint Chief Complaint  Patient presents with  . Fever    HPI Curtis Trevino is a 52 y.o. male.  Patient presents to the emergency department for evaluation of fever and chills.  Patient reports that symptoms began earlier today.  Over the course of the afternoon he started to feel worse.  He was aching all over, all of his joints hurt.  He was running a fever, had noticed that he had some lower back pain.  He urinated and there was burning when he urinated.  He has not had any urinary frequency with this.  Patient denies any specific cough.  He has not had nausea, vomiting, diarrhea or abdominal pain.      Past Medical History:  Diagnosis Date  . Acid reflux   . Chronic back pain     Patient Active Problem List   Diagnosis Date Noted  . Cholecystitis with cholelithiasis 11/18/2013    Past Surgical History:  Procedure Laterality Date  . CHOLECYSTECTOMY N/A 11/18/2013   Procedure: LAPAROSCOPIC CHOLECYSTECTOMY;  Surgeon: Scherry Ran, MD;  Location: [facility];  Service: General;  Laterality: N/A;  . ESOPHAGOGASTRODUODENOSCOPY  2005  . SCROTAL EXPLORATION Left 06/23/2016   Procedure: LEFT SCROTAL EXPLORATION WITH EXCISION OF EPIDIDYMAL MASS;  Surgeon: Irine Seal, MD;  Location: [facility];  Service: Urology;  Laterality: Left;       Home Medications    Prior to Admission medications   Medication Sig Start Date End Date Taking? Authorizing Provider  amoxicillin (AMOXIL) 875 MG tablet Take 1 tablet (875 mg total) by mouth 2 (two) times daily. 07/12/16   Lysbeth Penner, FNP  aspirin-acetaminophen-caffeine (EXCEDRIN MIGRAINE) [phone] MG tablet Take 3-4 tablets by mouth every 8 (eight) hours as needed (for migraine headache.).    [provider]  b complex vitamins tablet Take 1 tablet by mouth daily.    [provider]    cholecalciferol (VITAMIN D) 400 units TABS tablet Take 400 Units by mouth daily.    [provider]  ibuprofen (ADVIL,MOTRIN) 200 MG tablet Take 400-600 mg by mouth every 8 (eight) hours as needed (for migraine headaches.).    [provider]  ipratropium (ATROVENT) 0.06 % nasal spray Place 2 sprays into both nostrils 4 (four) times daily. 07/12/16   Lysbeth Penner, FNP  lidocaine (XYLOCAINE) 2 % solution Use as directed 20 mLs in the mouth or throat as needed for mouth pain. 07/12/16   Lysbeth Penner, FNP  Multiple Vitamin (MULTIVITAMIN WITH MINERALS) TABS tablet Take 1 tablet by mouth daily.    [provider]  omeprazole (PRILOSEC OTC) 20 MG tablet Take 20 mg by mouth at bedtime.     [provider]  oseltamivir (TAMIFLU) 75 MG capsule Take 1 capsule (75 mg total) by mouth every 12 (twelve) hours. 05/17/17   Orpah Greek, MD  traMADol (ULTRAM) 50 MG tablet Take 1 tablet (50 mg total) by mouth every 6 (six) hours as needed for moderate pain or severe pain. 06/23/16   Irine Seal, MD  vitamin C (ASCORBIC ACID) 500 MG tablet Take 1,000 mg by mouth daily.    [provider]    Family History No family history on file.  Social History Social History   Tobacco Use  . Smoking status: Former Smoker    Last attempt to  quit: 11/15/2010    Years since quitting: 6.5  . Smokeless tobacco: Never Used  Substance Use Topics  . Alcohol use: Yes    Alcohol/week: 1.8 oz    Types: 3 Shots of liquor per week    Comment: 3 drink a day  . Drug use: No     Allergies   Tylenol [acetaminophen] and Codeine   Review of Systems Review of Systems  Constitutional: Positive for chills and fever.  Genitourinary: Positive for dysuria.  Musculoskeletal: Positive for arthralgias and myalgias.  All other systems reviewed and are negative.    Physical Exam Updated Vital Signs BP 119/82 (BP Location: Right Arm)   Pulse (!) 132   Temp 99 F (37.2 C)  (Oral)   Resp 20   Ht 5\' 8"  (1.727 m)   Wt 72.6 kg (160 lb)   SpO2 98%   BMI 24.33 kg/m   Physical Exam  Constitutional: He is oriented to person, place, and time. He appears well-developed and well-nourished. No distress.  HENT:  Head: Normocephalic and atraumatic.  Right Ear: Hearing normal.  Left Ear: Hearing normal.  Nose: Nose normal.  Mouth/Throat: Oropharynx is clear and moist and mucous membranes are normal.  Eyes: Conjunctivae and EOM are normal. Pupils are equal, round, and reactive to light.  Neck: Normal range of motion. Neck supple.  Cardiovascular: Regular rhythm, S1 normal and S2 normal. Exam reveals no gallop and no friction rub.  No murmur heard. Pulmonary/Chest: Effort normal and breath sounds normal. No respiratory distress. He exhibits no tenderness.  Abdominal: Soft. Normal appearance and bowel sounds are normal. There is no hepatosplenomegaly. There is no tenderness. There is no rebound, no guarding, no tenderness at McBurney's point and negative Murphy's sign. No hernia.  Musculoskeletal: Normal range of motion.  Neurological: He is alert and oriented to person, place, and time. He has normal strength. No cranial nerve deficit or sensory deficit. Coordination normal. GCS eye subscore is 4. GCS verbal subscore is 5. GCS motor subscore is 6.  Skin: Skin is warm, dry and intact. No rash noted. No cyanosis.  Psychiatric: He has a normal mood and affect. His speech is normal and behavior is normal. Thought content normal.  Nursing note and vitals reviewed.    ED Treatments / Results  Labs (all labs ordered are listed, but only abnormal results are displayed) Labs Reviewed  COMPREHENSIVE METABOLIC PANEL - Abnormal; Notable for the following components:      Result Value   CO2 21 (*)    Glucose, Bld 134 (*)    Total Protein 8.5 (*)    All other components within normal limits  CBC WITH DIFFERENTIAL/PLATELET - Abnormal; Notable for the following components:    WBC 17.8 (*)    Neutro Abs 14.8 (*)    Monocytes Absolute 1.4 (*)    All other components within normal limits  URINALYSIS, ROUTINE W REFLEX MICROSCOPIC - Abnormal; Notable for the following components:   Color, Urine AMBER (*)    All other components within normal limits  LACTIC ACID, PLASMA  LACTIC ACID, PLASMA    EKG  EKG Interpretation None       Radiology Dg Chest 2 View  Result Date: 05/17/2017 CLINICAL DATA:  Body ache and painful urination EXAM: CHEST  2 VIEW COMPARISON:  None. FINDINGS: The heart size and mediastinal contours are within normal limits. Both lungs are clear. Mild degenerative changes of the spine. Surgical clips in the right upper quadrant. IMPRESSION: No active  cardiopulmonary disease. Electronically Signed   By: Donavan Foil M.D.   On: 05/17/2017 00:27    Procedures Procedures (including critical care time)  Medications Ordered in ED Medications  oseltamivir (TAMIFLU) capsule 75 mg (not administered)  ibuprofen (ADVIL,MOTRIN) tablet 800 mg (800 mg Oral Given 05/16/17 2329)  sodium chloride 0.9 % bolus 1,000 mL (1,000 mLs Intravenous New Bag/Given 05/17/17 0128)     Initial Impression / Assessment and Plan / ED Course  I have reviewed the triage vital signs and the nursing notes.  Pertinent labs & imaging results that were available during my care of the patient were reviewed by me and considered in my medical decision making (see chart for details).     Patient presents to the ER for evaluation of fever.  Patient had a fever of 102.6 upon arrival.  His fever has resolved with treatment.  Source of the fever is unclear at this time.  He had generalized arthralgias and myalgias, but no joint abnormality on examination.  No erythema, warmth or swelling of his joints.  He did complain of increased back pain, but he has chronic back pain.  This was likely augmented by the fever.  He had one episode of dysuria but has not had any urinary frequency.   Urinalysis was normal.  Chest x-ray unremarkable.  He has not had any cough or congestion.  Abdominal exam was completely normal, no history of abdominal pain.  He did have a leukocytosis, etiology is unclear.  At this point I do not find any source for fever, nothing to indicate a bacterial infection at this time.  Discussed with patient that this is likely viral, will treat with Tamiflu empirically.  Continue to treat fever with Motrin and Tylenol, return for any worsening symptoms.  Final Clinical Impressions(s) / ED Diagnoses   Final diagnoses:  Fever, unspecified fever cause    ED Discharge Orders        Ordered    oseltamivir (TAMIFLU) 75 MG capsule  Every 12 hours     05/17/17 0212       Orpah Greek, MD 05/17/17 [phone]

## 2017-08-23 ENCOUNTER — Other Ambulatory Visit (HOSPITAL_COMMUNITY)
Admission: RE | Admit: 2017-08-23 | Discharge: 2017-08-23 | Disposition: A | Discharge status: Home / Self Care | Payer: BLUE CROSS/BLUE SHIELD | Source: Ambulatory Visit | Attending: Infectious Disease | Admitting: Infectious Disease

## 2017-08-23 ENCOUNTER — Ambulatory Visit (INDEPENDENT_AMBULATORY_CARE_PROVIDER_SITE_OTHER): Payer: BLUE CROSS/BLUE SHIELD | Admitting: Pharmacist

## 2017-08-23 DIAGNOSIS — Z7252 High risk homosexual behavior: Secondary | ICD-10-CM

## 2017-08-23 NOTE — Progress Notes (Signed)
Date:  08/23/2017   HPI: Curtis Trevino is a 52 y.o. male who presents to the [place] clinic for discussion and initiation of PrEP.   Insured   [x]    Uninsured  []    Patient Active Problem List   Diagnosis Date Noted  . Cholecystitis with cholelithiasis 11/18/2013    Patient's Medications  New Prescriptions   No medications on file  Previous Medications   ASPIRIN-ACETAMINOPHEN-CAFFEINE (EXCEDRIN MIGRAINE) 250-250-65 MG TABLET    Take 3-4 tablets by mouth every 8 (eight) hours as needed (for migraine headache.).   B COMPLEX VITAMINS TABLET    Take 1 tablet by mouth daily.   CHOLECALCIFEROL (VITAMIN D) 400 UNITS TABS TABLET    Take 400 Units by mouth daily.   IBUPROFEN (ADVIL,MOTRIN) 200 MG TABLET    Take 400-600 mg by mouth every 8 (eight) hours as needed (for migraine headaches.).   MULTIPLE VITAMIN (MULTIVITAMIN WITH MINERALS) TABS TABLET    Take 1 tablet by mouth daily.   OMEPRAZOLE (PRILOSEC OTC) 20 MG TABLET    Take 20 mg by mouth at bedtime.    VITAMIN C (ASCORBIC ACID) 500 MG TABLET    Take 1,000 mg by mouth daily.  Modified Medications   No medications on file  Discontinued Medications   AMOXICILLIN (AMOXIL) 875 MG TABLET    Take 1 tablet (875 mg total) by mouth 2 (two) times daily.   IPRATROPIUM (ATROVENT) 0.06 % NASAL SPRAY    Place 2 sprays into both nostrils 4 (four) times daily.   LIDOCAINE (XYLOCAINE) 2 % SOLUTION    Use as directed 20 mLs in the mouth or throat as needed for mouth pain.   OSELTAMIVIR (TAMIFLU) 75 MG CAPSULE    Take 1 capsule (75 mg total) by mouth every 12 (twelve) hours.   TRAMADOL (ULTRAM) 50 MG TABLET    Take 1 tablet (50 mg total) by mouth every 6 (six) hours as needed for moderate pain or severe pain.    Allergies: Allergies  Allergen Reactions  . Tylenol [Acetaminophen] Nausea And Vomiting    Vomiting every time he takes tylenol  . Codeine Hives, Itching and Rash    Past Medical History: Past Medical History:  Diagnosis Date    . Acid reflux   . Chronic back pain     Social History: Social History   Socioeconomic History  . Marital status: Single    Spouse name: Not on file  . Number of children: Not on file  . Years of education: Not on file  . Highest education level: Not on file  Occupational History  . Not on file  Social Needs  . Financial resource strain: Not on file  . Food insecurity:    Worry: Not on file    Inability: Not on file  . Transportation needs:    Medical: Not on file    Non-medical: Not on file  Tobacco Use  . Smoking status: Former Smoker    Last attempt to quit: 11/15/2010    Years since quitting: 6.7  . Smokeless tobacco: Never Used  Substance and Sexual Activity  . Alcohol use: Yes    Alcohol/week: 1.8 oz    Types: 3 Shots of liquor per week    Comment: 3 drink a day  . Drug use: No  . Sexual activity: Not on file  Lifestyle  . Physical activity:    Days per week: Not on file    Minutes per session: Not on  file  . Stress: Not on file  Relationships  . Social connections:    Talks on phone: Not on file    Gets together: Not on file    Attends religious service: Not on file    Active member of club or organization: Not on file    Attends meetings of clubs or organizations: Not on file    Relationship status: Not on file  Other Topics Concern  . Not on file  Social History Narrative  . Not on file    CH[facility] HIV PREP FLOWSHEET RESULTS 08/23/2017  Insurance Status Insured  Gender at birth Male  Gender identity cis-Male  Risk for HIV Condomless vaginal or anal intercourse;>5 partners in past 6 mos (regardless of condom use)  Sex Partners Men only  # sex partners past 3-6 mos 10-12  Sex activity preferences Insertive and receptive;Oral  Condom use Yes  % condom use 36  Partners genders and ages M 50+;M 13-49  Treated for STI? No  HIV symptoms? N/A  PrEP Eligibility Substantial risk for HIV    Labs:  SCr: Lab Results  Component Value Date   CREATININE  1.24 05/16/2017   CREATININE 1.21 06/22/2016   CREATININE 1.23 11/19/2013   CREATININE 1.26 11/14/2013   CREATININE 1.22 10/26/2013   HIV No results found for: HIV Hepatitis B No results found for: HEPBSAB, HEPBSAG, HEPBCAB Hepatitis C No results found for: HEPCAB, HCVRNAPCRQN Hepatitis A No results found for: HAV RPR and STI No results found for: LABRPR, RPRTITER  No flowsheet data found.  HIV Pre-Exposure Prophylaxis (PrEP): Patient's risk for HIV: MSM, multiple sexual partners, condomless receptive anal intercourse Sexual partner preference: male Number of sexual partners in the last 6 months: at least 10 Condom use? Yes 65% Sexual activity preference: versatile, oral Symptoms of acute HIV? none Last date of BMET: March Last date of STI testing: March Last negative HIV antibody test: March  Assessment: Curtis Trevino is here today to discuss and initiate PrEP.  He usually gets yearly STD and HIV testing at his PCP office which is [facility] in [place].  He found out about our PrEP program through the [employer] app. He knows little about Truvada at baseline but knows that it protects people from getting HIV.  I went over the medication and explained the difference between Truvada for PrEP and Truvada in combination with something else for HIV. I explained the most common side effects of Truvada including headaches and nausea. He already suffers from migraines at baseline, so he is a little worried about that increasing.  I explained he needed to take it every day in order to be fully protected and it was not an "on demand" medication.  I also explained the difference between Truvada and Descovy and that Descovy would most likely be approved for PrEP very soon.  He was in a relationship with a male for 22 years but broke up with him and has been dating someone else since last July.  They are not monogamous and he says he has had at least 10 partners in the last 3-6 months. He uses  condoms only 50-60% of the time and is a versatile partner but usually receptive.  He also performs oral sex. His current boyfriend was previously dating someone who was HIV positive and his boyfriend has not been tested for HIV that he knows of.  I told him to give his partner my card, and we can get him in for PrEP too.  He  has never been with a partner that his HIV positive that he knows of.  He has never used PEP, stimulant drugs, or IV drugs. His mother, father, and sister all are IV drug users and his mother has Hepatitis C (which is currently cured with Harvoni) and Hepatitis B. He has never been diagnosed with a STD. I explained that he is at high risk for HIV considering he has condomless receptive anal intercourse with multiple partners. He is very interested.  I will get all baseline labs today as he is insured.  We are able to fill at [facility], so he will pick it up there if he is HIV negative.  He will come back and see me in 3-4 weeks.  Plan: - HIV antibody, RPR, 3 site gc/chlamydia testing - BMET, Hepatitis serologies as well - 30 days of Truvada if HIV negative - F/u with me again 6/11 at 2pm  Cassie L. Kuppelweiser, PharmD, [place], [facility] 08/23/2017, 3:28 PM

## 2017-08-24 ENCOUNTER — Telehealth: Payer: Self-pay | Admitting: Pharmacist

## 2017-08-24 DIAGNOSIS — Z7252 High risk homosexual behavior: Secondary | ICD-10-CM

## 2017-08-24 LAB — BASIC METABOLIC PANEL
BUN: 19 mg/dL (ref 7–25)
CALCIUM: 9.9 mg/dL (ref 8.6–10.3)
CO2: 26 mmol/L (ref 20–32)
Chloride: 107 mmol/L (ref 98–110)
Creat: 1.24 mg/dL (ref 0.70–1.33)
Glucose, Bld: 119 mg/dL — ABNORMAL HIGH (ref 65–99)
Potassium: 4.3 mmol/L (ref 3.5–5.3)
SODIUM: 141 mmol/L (ref 135–146)

## 2017-08-24 LAB — HEPATITIS B SURFACE ANTIGEN: Hepatitis B Surface Ag: NONREACTIVE

## 2017-08-24 LAB — HEPATITIS B SURFACE ANTIBODY,QUALITATIVE: HEP B S AB: NONREACTIVE

## 2017-08-24 LAB — HIV ANTIBODY (ROUTINE TESTING W REFLEX): HIV 1&2 Ab, 4th Generation: NONREACTIVE

## 2017-08-24 LAB — HEPATITIS C ANTIBODY
Hepatitis C Ab: NONREACTIVE
SIGNAL TO CUT-OFF: 0.07 (ref <1.00)

## 2017-08-24 LAB — RPR TITER

## 2017-08-24 LAB — CYTOLOGY, (ORAL, ANAL, URETHRAL) ANCILLARY ONLY
CHLAMYDIA, DNA PROBE: NEGATIVE
Chlamydia: NEGATIVE
Neisseria Gonorrhea: NEGATIVE
Neisseria Gonorrhea: POSITIVE — AB

## 2017-08-24 LAB — HEPATITIS A ANTIBODY, TOTAL: Hepatitis A AB,Total: NONREACTIVE

## 2017-08-24 LAB — URINE CYTOLOGY ANCILLARY ONLY
Chlamydia: NEGATIVE
NEISSERIA GONORRHEA: NEGATIVE

## 2017-08-24 LAB — FLUORESCENT TREPONEMAL AB(FTA)-IGG-BLD: Fluorescent Treponemal ABS: REACTIVE — AB

## 2017-08-24 LAB — RPR: RPR: REACTIVE — AB

## 2017-08-24 MED ORDER — EMTRICITABINE-TENOFOVIR DF 200-300 MG PO TABS: 1.0000 | ORAL_TABLET | Freq: Every day | ORAL | 0 refills | Status: DC

## 2017-08-24 MED FILL — TRUVADA 200-300 MG TABS: 200-300 | 30 days supply | Qty: 30 | Fill #0

## 2017-08-24 NOTE — Telephone Encounter (Signed)
Thanks Cassie 

## 2017-08-24 NOTE — Telephone Encounter (Signed)
Patient saw me for PrEP yesterday. His lab results have not resulted in the computer yet, so I called [facility] and they faxed me his results. His HIV antibody is negative. Will send in 30 days of Truvada to [facility] and he will pick it up today.  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 Patient has never been treated for STD and the last time he was tested was last March.  I made an appt for Monday for patient to come in and get treatment. He will need:  - Penicillin G 2.4 million units IM x 1 - Ceftriaxone 250 mg IM x 1 - Azithromycin 1 gm PO x 1  Told him to tell his partners to get tested/treated.  Also told him to avoid sex for at least 7 days after treatment.

## 2017-08-27 ENCOUNTER — Ambulatory Visit (INDEPENDENT_AMBULATORY_CARE_PROVIDER_SITE_OTHER): Payer: BLUE CROSS/BLUE SHIELD | Admitting: Pharmacist Clinician (PhC)/ Clinical Pharmacy Specialist

## 2017-08-27 DIAGNOSIS — A539 Syphilis, unspecified: Secondary | ICD-10-CM

## 2017-08-27 DIAGNOSIS — A549 Gonococcal infection, unspecified: Secondary | ICD-10-CM | POA: Diagnosis not present

## 2017-08-27 MED ORDER — PENICILLIN G BENZATHINE 1200000 UNIT/2ML IM SUSP
2.4000 10*6.[IU] | Freq: Once | INTRAMUSCULAR | Status: AC
  Administered 2017-08-27: 2.4 10*6.[IU] via INTRAMUSCULAR

## 2017-08-27 MED ORDER — CEFTRIAXONE SODIUM 250 MG IJ SOLR
250.0000 mg | Freq: Once | INTRAMUSCULAR | Status: AC
  Administered 2017-08-27: 250 mg via INTRAMUSCULAR

## 2017-08-27 MED ORDER — AZITHROMYCIN 250 MG PO TABS
1000.0000 mg | ORAL_TABLET | Freq: Once | ORAL | Status: AC
  Administered 2017-08-27: 1000 mg via ORAL

## 2017-08-27 NOTE — Progress Notes (Signed)
Curtis Trevino came back for his bicillin LA 2.4, rocephin 250mg , azithromycin 1000mg . 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 Gave the bicillin in the upper buttocks, ceftriaxon in the right deltoid.   [name], PharmD, BCPS, AAHIVP, CPP Infectious Disease Pharmacist Pager: [phone] 08/27/2017 2:50 PM

## 2017-09-18 ENCOUNTER — Other Ambulatory Visit (HOSPITAL_COMMUNITY)
Admission: RE | Admit: 2017-09-18 | Discharge: 2017-09-18 | Disposition: A | Discharge status: Home / Self Care | Payer: BLUE CROSS/BLUE SHIELD | Source: Ambulatory Visit | Attending: Infectious Disease | Admitting: Infectious Disease

## 2017-09-18 ENCOUNTER — Ambulatory Visit (INDEPENDENT_AMBULATORY_CARE_PROVIDER_SITE_OTHER): Payer: BLUE CROSS/BLUE SHIELD | Admitting: Pharmacist

## 2017-09-18 DIAGNOSIS — Z7252 High risk homosexual behavior: Secondary | ICD-10-CM | POA: Insufficient documentation

## 2017-09-18 NOTE — Progress Notes (Signed)
Date:  09/18/2017   Insured   [x]    Uninsured  []    HPI  Curtis Trevino is a 52 y.o. male who presents to the [facility] for his 1 month PrEP visit.  Demographics Race:  White or Caucasian [1] Marital Status:  Single  Allergies Allergies  Allergen Reactions  . Tylenol [Acetaminophen] Nausea And Vomiting    Vomiting every time he takes tylenol  . Codeine Hives, Itching and Rash   Past Medical History:  Diagnosis Date  . Acid reflux   . Chronic back pain    Outpatient Encounter Medications as of 09/18/2017  Medication Sig  . aspirin-acetaminophen-caffeine (EXCEDRIN MIGRAINE) 250-250-65 MG tablet Take 3-4 tablets by mouth every 8 (eight) hours as needed (for migraine headache.).  Marland Kitchen b complex vitamins tablet Take 1 tablet by mouth daily.  . cholecalciferol (VITAMIN D) 400 units TABS tablet Take 400 Units by mouth daily.  Marland Kitchen emtricitabine-tenofovir (TRUVADA) 200-300 MG tablet Take 1 tablet by mouth daily.  Marland Kitchen ibuprofen (ADVIL,MOTRIN) 200 MG tablet Take 400-600 mg by mouth every 8 (eight) hours as needed (for migraine headaches.).  Marland Kitchen Multiple Vitamin (MULTIVITAMIN WITH MINERALS) TABS tablet Take 1 tablet by mouth daily.  Marland Kitchen omeprazole (PRILOSEC OTC) 20 MG tablet Take 20 mg by mouth at bedtime.   . vitamin C (ASCORBIC ACID) 500 MG tablet Take 1,000 mg by mouth daily.   No facility-administered encounter medications on file as of 09/18/2017.    Social History   Tobacco Use  Smoking Status Former Smoker  . Last attempt to quit: 11/15/2010  . Years since quitting: 6.8  Smokeless Tobacco Never Used   Social History   Substance and Sexual Activity  Alcohol Use Yes  . Alcohol/week: 1.8 oz  . Types: 3 Shots of liquor per week   Comment: 3 drink a day   Drug use?   Yes []  No [x]   Injectable drug use?   Yes []     No [x]   Sexual History  Missing doses? Yes []    No  [x]   [facility] HIV PREP FLOWSHEET RESULTS 09/18/2017 08/23/2017  Insurance Status - Insured  Gender at birth  Male Male  Gender identity cis-Male cis-Male  Risk for HIV >5 partners in past 6 mos (regardless of condom use) Condomless vaginal or anal intercourse;>5 partners in past 6 mos (regardless of condom use)  Sex Partners Men only Men only  # sex partners past 3-6 mos 1-3 10-12  Sex activity preferences Receptive;Insertive;Oral Insertive and receptive;Oral  Condom use Yes Yes  % condom use 78 4  Partners genders and ages M 30-49;M 25-29 M 50+;M 30-49  Treated for STI? No No  HIV symptoms? - N/A  PrEP Eligibility HIV negative;Substantial risk for HIV Substantial risk for HIV   Labs: Creatinine Lab Results  Component Value Date   CREATININE 1.24 08/23/2017   CREATININE 1.24 05/16/2017   CREATININE 1.21 06/22/2016   CREATININE 1.23 11/19/2013   CREATININE 1.26 11/14/2013   HIV Lab Results  Component Value Date   HIV NON-REACTIVE 08/23/2017   GFR CrCl cannot be calculated (Patient's most recent lab result is older than the maximum 21 days allowed.).  Hepatitis B Lab Results  Component Value Date   HEPBSAB NON-REACTIVE 08/23/2017   Lab Results  Component Value Date   HEPBSAG NON-REACTIVE 08/23/2017   Hepatitis C No results found for: HCVAB  Hepatitis A Lab Results  Component Value Date   HAV NON-REACTIVE 08/23/2017   RPR and STI  Lab Results  Component Value Date   LABRPR REACTIVE (A) 08/23/2017   RPRTITER 1:32 (H) 08/23/2017   STI Results GC CT  08/23/2017 Negative Negative  08/23/2017 Negative Negative  08/23/2017 **POSITIVE**(A) Negative   Assessment  Curtis Trevino is here for his 1 month PrEP visit. He has been seeing the same guy since last July, Jimmy, but has also endorsed a total of 10 partners in the past 6 months. [name] has tested positive for gonorrhea and syphilis of which Brave has also tested positive, and was treated back in May with PenG, CTX, and Azithromycin.   He is doing well on Truvada, no missed doses and reports no side effects. He states he has  about 10 pills left in his bottle and requests that his next fill be shipped from [facility] since he doesn't come to [place] often.  He remains a versatile partner and also performs oral sex. He requests to be STI tested today as he expresses worry for STI's and wants to see his syphilis titer come down.  Recommendations  HIV Ab, oral/rectal swabs, urine STI testing RPR per patient request 3 more months of Truvada if HIV neg F/u with pharmacy team 9/9 @ 2pm  Beverlee Nims L. Kyung Rudd, PharmD, [place] PGY1 Pharmacy Resident

## 2017-09-19 ENCOUNTER — Telehealth: Payer: Self-pay | Admitting: Pharmacist

## 2017-09-19 DIAGNOSIS — Z7252 High risk homosexual behavior: Secondary | ICD-10-CM

## 2017-09-19 LAB — RPR TITER: RPR Titer: 1:32 {titer} — ABNORMAL HIGH

## 2017-09-19 LAB — RPR: RPR Ser Ql: REACTIVE — AB

## 2017-09-19 LAB — HIV ANTIBODY (ROUTINE TESTING W REFLEX): HIV: NONREACTIVE

## 2017-09-19 LAB — FLUORESCENT TREPONEMAL AB(FTA)-IGG-BLD: FLUORESCENT TREPONEMAL ABS: REACTIVE — AB

## 2017-09-19 MED ORDER — EMTRICITABINE-TENOFOVIR DF 200-300 MG PO TABS: 1.0000 | ORAL_TABLET | Freq: Every day | ORAL | 2 refills | Status: DC

## 2017-09-19 MED FILL — TRUVADA 200-300 MG TABS: 200-300 | 30 days supply | Qty: 30 | Fill #0

## 2017-09-19 NOTE — Telephone Encounter (Signed)
Called Curtis Trevino to let him know that his HIV antibody was negative.  Will send in 3 more months of Truvada to [facility] and they will mail it.

## 2017-09-20 LAB — CYTOLOGY, (ORAL, ANAL, URETHRAL) ANCILLARY ONLY
Chlamydia: NEGATIVE
Chlamydia: NEGATIVE
NEISSERIA GONORRHEA: NEGATIVE
Neisseria Gonorrhea: NEGATIVE

## 2017-09-20 LAB — URINE CYTOLOGY ANCILLARY ONLY
Chlamydia: NEGATIVE
NEISSERIA GONORRHEA: NEGATIVE

## 2017-10-18 MED FILL — TRUVADA 200-300 MG TABS: 200-300 | 30 days supply | Qty: 30 | Fill #1

## 2017-11-20 MED FILL — TRUVADA 200-300 MG TABS: 200-300 | 30 days supply | Qty: 30 | Fill #2

## 2017-12-17 ENCOUNTER — Ambulatory Visit (INDEPENDENT_AMBULATORY_CARE_PROVIDER_SITE_OTHER): Payer: BLUE CROSS/BLUE SHIELD | Admitting: Pharmacist

## 2017-12-17 DIAGNOSIS — Z5181 Encounter for therapeutic drug level monitoring: Secondary | ICD-10-CM | POA: Diagnosis not present

## 2017-12-17 DIAGNOSIS — Z7252 High risk homosexual behavior: Secondary | ICD-10-CM | POA: Diagnosis not present

## 2017-12-17 NOTE — Progress Notes (Signed)
Date:  12/17/2017   HPI: Curtis Trevino is a 52 y.o. male who presents to the [facility] clinic for his 3 month PrEP visit.  Insured   [x]    Uninsured  []    Patient Active Problem List   Diagnosis Date Noted  . Cholecystitis with cholelithiasis 11/18/2013    Patient's Medications  New Prescriptions   No medications on file  Previous Medications   ASPIRIN-ACETAMINOPHEN-CAFFEINE (EXCEDRIN MIGRAINE) 250-250-65 MG TABLET    Take 3-4 tablets by mouth every 8 (eight) hours as needed (for migraine headache.).   B COMPLEX VITAMINS TABLET    Take 1 tablet by mouth daily.   CHOLECALCIFEROL (VITAMIN D) 400 UNITS TABS TABLET    Take 400 Units by mouth daily.   EMTRICITABINE-TENOFOVIR (TRUVADA) 200-300 MG TABLET    Take 1 tablet by mouth daily.   IBUPROFEN (ADVIL,MOTRIN) 200 MG TABLET    Take 400-600 mg by mouth every 8 (eight) hours as needed (for migraine headaches.).   MULTIPLE VITAMIN (MULTIVITAMIN WITH MINERALS) TABS TABLET    Take 1 tablet by mouth daily.   OMEPRAZOLE (PRILOSEC OTC) 20 MG TABLET    Take 20 mg by mouth at bedtime.    VITAMIN C (ASCORBIC ACID) 500 MG TABLET    Take 1,000 mg by mouth daily.  Modified Medications   No medications on file  Discontinued Medications   No medications on file    Allergies: Allergies  Allergen Reactions  . Tylenol [Acetaminophen] Nausea And Vomiting    Vomiting every time he takes tylenol  . Codeine Hives, Itching and Rash    Past Medical History: Past Medical History:  Diagnosis Date  . Acid reflux   . Chronic back pain     Social History: Social History   Socioeconomic History  . Marital status: Single    Spouse name: Not on file  . Number of children: Not on file  . Years of education: Not on file  . Highest education level: Not on file  Occupational History  . Not on file  Social Needs  . Financial resource strain: Not on file  . Food insecurity:    Worry: Not on file    Inability: Not on file  . Transportation  needs:    Medical: Not on file    Non-medical: Not on file  Tobacco Use  . Smoking status: Former Smoker    Last attempt to quit: 11/15/2010    Years since quitting: 7.0  . Smokeless tobacco: Never Used  Substance and Sexual Activity  . Alcohol use: Yes    Alcohol/week: 3.0 standard drinks    Types: 3 Shots of liquor per week    Comment: 3 drink a day  . Drug use: No  . Sexual activity: Not on file  Lifestyle  . Physical activity:    Days per week: Not on file    Minutes per session: Not on file  . Stress: Not on file  Relationships  . Social connections:    Talks on phone: Not on file    Gets together: Not on file    Attends religious service: Not on file    Active member of club or organization: Not on file    Attends meetings of clubs or organizations: Not on file    Relationship status: Not on file  Other Topics Concern  . Not on file  Social History Narrative  . Not on file    [facility] HIV PREP FLOWSHEET RESULTS 12/17/2017 09/18/2017  08/23/2017  Insurance Status Insured - Insured  Gender at birth Male Male Male  Gender identity cis-Male cis-Male cis-Male  Risk for HIV In sexual relationship with HIV+ partner;Condomless vaginal or anal intercourse;>5 partners in past 6 mos (regardless of condom use);Hx of STI >5 partners in past 6 mos (regardless of condom use) Condomless vaginal or anal intercourse;>5 partners in past 6 mos (regardless of condom use)  Sex Partners Men only Men only Men only  # sex partners past 3-6 mos 1-3 1-3 10-12  Sex activity preferences Insertive and receptive;Oral Receptive;Insertive;Oral Insertive and receptive;Oral  Condom use No Yes Yes  % condom use - 59 65  Partners genders and ages M 20-24 M 30-49;M 25-29 M 50+;M 30-49  Treated for STI? No No No  HIV symptoms? N/A - N/A  PrEP Eligibility - HIV negative;Substantial risk for HIV Substantial risk for HIV    Labs:  SCr: Lab Results  Component Value Date   CREATININE 1.24 08/23/2017    CREATININE 1.24 05/16/2017   CREATININE 1.21 06/22/2016   CREATININE 1.23 11/19/2013   CREATININE 1.26 11/14/2013   HIV Lab Results  Component Value Date   HIV NON-REACTIVE 09/18/2017   HIV NON-REACTIVE 08/23/2017   Hepatitis B Lab Results  Component Value Date   HEPBSAB NON-REACTIVE 08/23/2017   HEPBSAG NON-REACTIVE 08/23/2017   Hepatitis C Lab Results  Component Value Date   HEPCAB NON-REACTIVE 08/23/2017   Hepatitis A Lab Results  Component Value Date   HAV NON-REACTIVE 08/23/2017   RPR and STI Lab Results  Component Value Date   LABRPR REACTIVE (A) 09/18/2017   LABRPR REACTIVE (A) 08/23/2017   RPRTITER 1:32 (H) 09/18/2017   RPRTITER 1:32 (H) 08/23/2017    STI Results GC CT  09/18/2017 Negative Negative  09/18/2017 Negative Negative  09/18/2017 Negative Negative  08/23/2017 Negative Negative  08/23/2017 Negative Negative  08/23/2017 **POSITIVE**(A) Negative    Assessment: Curtis Trevino is here for his 3 month PrEP follow-up. Overall he is doing well on his Truvada and has not missed any doses. He does report that he had an increased sense of smell for 3-4 weeks after starting Truvada but that has resolved. His only complaint today is acne outbreaks on the back of his scalp that have been occurring since starting Truvada. He denies using any new hair products or laundry detergents. He also reports some weight gain but feels this is likely due to his diet.   He has had 2 partners within the last 3 months, one of them being Curtis Trevino who he has been seeing since last July. He remains a versatile partner and also performs oral sex. He reports that he does not use condoms. His other partner is a 55 YOM who was found to be HIV positive back in March and was severely ill and hospitalized. Curtis Trevino reports that this partner is now doing well on HIV treatment. Curtis Trevino and this partner do use condoms but do not engage in intercourse.  We will check his HIV antibody today and if he is HIV  negative, will order for 3 more months of Truvada. Will also get a BMET today to check his kidney function. We also discussed that he does not have immunity to Hepatitis A and B and that he is at higher risk for these viruses. Curtis Trevino was amenable to getting these vaccine series started next week. He declined a flu shot today.  Plan: - HIV antibody and BMET today - 3 more months of Truvada if HIV negative -  Return to clinic on 9/19 for first HepA and HepB vaccines - 3 month PrEP f/u on 12/9 at 1:45pm  Jackson Latino, PharmD PGY1 Pharmacy Resident Phone [phone] 12/17/2017     3:08 PM

## 2017-12-18 ENCOUNTER — Telehealth: Payer: Self-pay | Admitting: Pharmacist

## 2017-12-18 DIAGNOSIS — Z7252 High risk homosexual behavior: Secondary | ICD-10-CM

## 2017-12-18 LAB — BASIC METABOLIC PANEL
BUN/Creatinine Ratio: 17 (calc) (ref 6–22)
BUN: 25 mg/dL (ref 7–25)
CO2: 28 mmol/L (ref 20–32)
CREATININE: 1.5 mg/dL — AB (ref 0.70–1.33)
Calcium: 10.6 mg/dL — ABNORMAL HIGH (ref 8.6–10.3)
Chloride: 103 mmol/L (ref 98–110)
GLUCOSE: 79 mg/dL (ref 65–99)
Potassium: 4.5 mmol/L (ref 3.5–5.3)
Sodium: 140 mmol/L (ref 135–146)

## 2017-12-18 LAB — HIV ANTIBODY (ROUTINE TESTING W REFLEX): HIV: NONREACTIVE

## 2017-12-18 MED ORDER — EMTRICITABINE-TENOFOVIR DF 200-300 MG PO TABS: 1.0000 | ORAL_TABLET | Freq: Every day | ORAL | 2 refills | Status: DC

## 2017-12-18 NOTE — Telephone Encounter (Signed)
Called patient to let him know that his HIV antibody was negative.  Will send in 3 more months of Truvada.

## 2017-12-24 MED FILL — TRUVADA 200-300 MG TABS: 200-300 | 30 days supply | Qty: 30 | Fill #0

## 2017-12-27 ENCOUNTER — Ambulatory Visit (INDEPENDENT_AMBULATORY_CARE_PROVIDER_SITE_OTHER): Payer: BLUE CROSS/BLUE SHIELD | Admitting: Pharmacist

## 2017-12-27 DIAGNOSIS — Z7252 High risk homosexual behavior: Secondary | ICD-10-CM | POA: Diagnosis not present

## 2017-12-27 DIAGNOSIS — Z23 Encounter for immunization: Secondary | ICD-10-CM | POA: Diagnosis not present

## 2017-12-27 NOTE — Progress Notes (Signed)
   [place] for Infectious Disease Pharmacy Vaccination Visit  HPI: Curtis Trevino is a 52 y.o. male who presents to the [facility] clinic for vaccinations.  Hepatitis B Lab Results  Component Value Date   HEPBSAB NON-REACTIVE 08/23/2017   Lab Results  Component Value Date   HEPBSAG NON-REACTIVE 08/23/2017    Hepatitis C No results found for: HCVAB  Hepatitis A Lab Results  Component Value Date   HAV NON-REACTIVE 08/23/2017    Assessment: I see Curtis Trevino for PrEP and will start his Hep A and Hep B vaccination series today.  I will also give him a flu shot.   Plan: - Hep B #1/3 - Hep A #1/2 - Flu shot - F/u with me 10/21 at 230 for Hep B #2 - F/u with me 12/9 at 145pm for PrEP f/u  Khalif Stender L. Reda Citron, PharmD, [place], [place] for Infectious Disease 12/27/2017, 3:30 PM

## 2018-01-28 ENCOUNTER — Ambulatory Visit (INDEPENDENT_AMBULATORY_CARE_PROVIDER_SITE_OTHER): Payer: BLUE CROSS/BLUE SHIELD | Admitting: Pharmacist

## 2018-01-28 DIAGNOSIS — Z23 Encounter for immunization: Secondary | ICD-10-CM

## 2018-01-28 DIAGNOSIS — Z7252 High risk homosexual behavior: Secondary | ICD-10-CM | POA: Diagnosis not present

## 2018-01-28 MED ORDER — EMTRICITABINE-TENOFOVIR AF 200-25 MG PO TABS: 1.0000 | ORAL_TABLET | Freq: Every day | ORAL | 1 refills | Status: DC

## 2018-01-28 MED FILL — DESCOVY 200-25 MG TABS: 200-25 | 30 days supply | Qty: 30 | Fill #0

## 2018-01-28 NOTE — Progress Notes (Signed)
   [facility] [facility] Infect[facility] Pharmacy Vaccination Visit  HPI: Curtis Trevino is a 52 y.o. male who presents to [facility] for his second hepatitis B vaccine .   Hepatitis B Lab Results  Component Value Date   HEPBSAB NON-REACTIVE 08/23/2017   Lab Results  Component Value Date   HEPBSAG NON-REACTIVE 08/23/2017    Hepatitis C No results found for: HCVAB  Hepatitis A Lab Results  Component Value Date   HAV NON-REACTIVE 08/23/2017    Assessment: Curtis Trevino is here for his second hepatitis B vaccine. He is currently on Truvada for PrEP and wishes to change to Descovy. Counseled patient on how Descovy is a one pill once daily regimen with or without food that can prevent HIV. Discussed the importance of taking it every day for Descovy to provide protection and decreased adherence is associated with decreased efficacy. Counseled patient that Descovy is normally well tolerated, however some patients experience nausea, diarrhea, and fatigue that goes away in a few weeks. Hepatitis B vaccine administered in office. Follow up in December for PrEP.    Plan: -Stop Truvada -Start Descovy for PrEP -Hepatitis B vaccine given today -F/u in December for 3 month PrEP    Curtis Trevino, Pharm D PGY1 Pharmacy Resident  01/28/2018      2:44 PM

## 2018-01-28 NOTE — Patient Instructions (Signed)
It was great to see you today.   You received your second Hepatitis B vaccine today.  Stop Truvada and start Descovy.   Follow up in December.

## 2018-02-25 MED FILL — DESCOVY 200-25 MG TABS: 200-25 | 30 days supply | Qty: 30 | Fill #1

## 2018-03-18 ENCOUNTER — Other Ambulatory Visit (HOSPITAL_COMMUNITY)
Admission: RE | Admit: 2018-03-18 | Discharge: 2018-03-18 | Disposition: A | Discharge status: Home / Self Care | Payer: BLUE CROSS/BLUE SHIELD | Source: Ambulatory Visit | Attending: Infectious Disease | Admitting: Infectious Disease

## 2018-03-18 ENCOUNTER — Ambulatory Visit (INDEPENDENT_AMBULATORY_CARE_PROVIDER_SITE_OTHER): Payer: BLUE CROSS/BLUE SHIELD | Admitting: Pharmacist

## 2018-03-18 DIAGNOSIS — Z7252 High risk homosexual behavior: Secondary | ICD-10-CM

## 2018-03-18 NOTE — Progress Notes (Signed)
Date:  03/18/2018   HPI: Curtis Trevino is a 52 y.o. male who presents to the [facility] clinic for his 3 months PrEP follow-up.  Insured   [x]    Uninsured  []    Patient Active Problem List   Diagnosis Date Noted  . Cholecystitis with cholelithiasis 11/18/2013    Patient's Medications  New Prescriptions   No medications on file  Previous Medications   ASPIRIN-ACETAMINOPHEN-CAFFEINE (EXCEDRIN MIGRAINE) 250-250-65 MG TABLET    Take 3-4 tablets by mouth every 8 (eight) hours as needed (for migraine headache.).   B COMPLEX VITAMINS TABLET    Take 1 tablet by mouth daily.   CHOLECALCIFEROL (VITAMIN D) 400 UNITS TABS TABLET    Take 400 Units by mouth daily.   EMTRICITABINE-TENOFOVIR AF (DESCOVY) 200-25 MG TABLET    Take 1 tablet by mouth daily.   IBUPROFEN (ADVIL,MOTRIN) 200 MG TABLET    Take 400-600 mg by mouth every 8 (eight) hours as needed (for migraine headaches.).   MULTIPLE VITAMIN (MULTIVITAMIN WITH MINERALS) TABS TABLET    Take 1 tablet by mouth daily.   OMEPRAZOLE (PRILOSEC OTC) 20 MG TABLET    Take 20 mg by mouth at bedtime.    VITAMIN C (ASCORBIC ACID) 500 MG TABLET    Take 1,000 mg by mouth daily.  Modified Medications   No medications on file  Discontinued Medications   No medications on file    Allergies: Allergies  Allergen Reactions  . Tylenol [Acetaminophen] Nausea And Vomiting    Vomiting every time he takes tylenol  . Codeine Hives, Itching and Rash    Past Medical History: Past Medical History:  Diagnosis Date  . Acid reflux   . Chronic back pain     Social History: Social History   Socioeconomic History  . Marital status: Single    Spouse name: Not on file  . Number of children: Not on file  . Years of education: Not on file  . Highest education level: Not on file  Occupational History  . Not on file  Social Needs  . Financial resource strain: Not on file  . Food insecurity:    Worry: Not on file    Inability: Not on file  .  Transportation needs:    Medical: Not on file    Non-medical: Not on file  Tobacco Use  . Smoking status: Former Smoker    Last attempt to quit: 11/15/2010    Years since quitting: 7.3  . Smokeless tobacco: Never Used  Substance and Sexual Activity  . Alcohol use: Yes    Alcohol/week: 3.0 standard drinks    Types: 3 Shots of liquor per week    Comment: 3 drink a day  . Drug use: No  . Sexual activity: Not on file  Lifestyle  . Physical activity:    Days per week: Not on file    Minutes per session: Not on file  . Stress: Not on file  Relationships  . Social connections:    Talks on phone: Not on file    Gets together: Not on file    Attends religious service: Not on file    Active member of club or organization: Not on file    Attends meetings of clubs or organizations: Not on file    Relationship status: Not on file  Other Topics Concern  . Not on file  Social History Narrative  . Not on file    [facility] HIV PREP FLOWSHEET RESULTS 03/18/2018  12/17/2017 09/18/2017 08/23/2017  Insurance Status Insured Insured - Insured  Gender at birth Male Male Male Male  Gender identity cis-Male cis-Male cis-Male cis-Male  Risk for HIV >5 partners in past 6 mos (regardless of condom use);Hx of STI;Condomless vaginal or anal intercourse In sexual relationship with HIV+ partner;Condomless vaginal or anal intercourse;>5 partners in past 6 mos (regardless of condom use);Hx of STI >5 partners in past 6 mos (regardless of condom use) Condomless vaginal or anal intercourse;>5 partners in past 6 mos (regardless of condom use)  Sex Partners Men only Men only Men only Men only  # sex partners past 3-6 mos 1-3 1-3 1-3 10-12  Sex activity preferences Insertive and receptive;Oral Insertive and receptive;Oral Receptive;Insertive;Oral Insertive and receptive;Oral  Condom use No No Yes Yes  % condom use - - 23 65  Partners genders and ages M 25-29 M 20-24 M 30-49;M 25-29 M 50+;M 30-49  Treated for STI? No No No No    HIV symptoms? N/A N/A - N/A  PrEP Eligibility Substantial risk for HIV;Past treatment for potential exposure to HIV from sex or injectable drugs - HIV negative;Substantial risk for HIV Substantial risk for HIV    Labs:  SCr: Lab Results  Component Value Date   CREATININE 1.50 (H) 12/17/2017   CREATININE 1.24 08/23/2017   CREATININE 1.24 05/16/2017   CREATININE 1.21 06/22/2016   CREATININE 1.23 11/19/2013   HIV Lab Results  Component Value Date   HIV NON-REACTIVE 12/17/2017   HIV NON-REACTIVE 09/18/2017   HIV NON-REACTIVE 08/23/2017   Hepatitis B Lab Results  Component Value Date   HEPBSAB NON-REACTIVE 08/23/2017   HEPBSAG NON-REACTIVE 08/23/2017   Hepatitis C Lab Results  Component Value Date   HEPCAB NON-REACTIVE 08/23/2017   Hepatitis A Lab Results  Component Value Date   HAV NON-REACTIVE 08/23/2017   RPR and STI Lab Results  Component Value Date   LABRPR REACTIVE (A) 09/18/2017   LABRPR REACTIVE (A) 08/23/2017   RPRTITER 1:32 (H) 09/18/2017   RPRTITER 1:32 (H) 08/23/2017    STI Results GC CT  09/18/2017 Negative Negative  09/18/2017 Negative Negative  09/18/2017 Negative Negative  08/23/2017 Negative Negative  08/23/2017 Negative Negative  08/23/2017 **POSITIVE**(A) Negative    Assessment: [name] is here today to follow-up for PrEP.  He was recently changed to [place] about a month and a half ago. He has missed no doses but is having some intense joint pain in his hands, legs, back, etc.  He is wondering if it is due to the Descovy.  I told him that I doubted it was and he then said he has dealt with this in the past and has had some episodes of arthritis.  I suspect that it is arthritis causing these issues and encouraged him to see his primary care for this, which he will. No issues getting it from the pharmacy. No changes in insurance with the coming new year.  He is in a monogamous relationship now and they have [place]  [place] and [place] his partner.  I also see his partner for PrEP. They do not use condoms. Will check HIV antibody and STDs today.  Can probably do yearly STDs if he remains monogamous.  He SCr was slightly elevated when last checked, so I will recheck that again today.  Will see him back in 3 months.  Plan: - HIV antibody, oral/urine/rectal gc/c, BMET, RPR - Descovy x 3  more months if HIV negative - F/u with me again 3/5 at 145pm   L. , PharmD, BCIDP, AAHIVP, [place] for Infectious Disease 03/18/2018, 3:25 PM

## 2018-03-19 ENCOUNTER — Telehealth: Payer: Self-pay | Admitting: Pharmacist

## 2018-03-19 DIAGNOSIS — Z7252 High risk homosexual behavior: Secondary | ICD-10-CM

## 2018-03-19 LAB — BASIC METABOLIC PANEL
BUN: 17 mg/dL (ref 7–25)
CHLORIDE: 105 mmol/L (ref 98–110)
CO2: 29 mmol/L (ref 20–32)
Calcium: 10 mg/dL (ref 8.6–10.3)
Creat: 1.33 mg/dL (ref 0.70–1.33)
GLUCOSE: 111 mg/dL — AB (ref 65–99)
Potassium: 4.5 mmol/L (ref 3.5–5.3)
Sodium: 141 mmol/L (ref 135–146)

## 2018-03-19 LAB — HIV ANTIBODY (ROUTINE TESTING W REFLEX): HIV: NONREACTIVE

## 2018-03-19 LAB — RPR: RPR Ser Ql: REACTIVE — AB

## 2018-03-19 LAB — RPR TITER

## 2018-03-19 LAB — FLUORESCENT TREPONEMAL AB(FTA)-IGG-BLD: FLUORESCENT TREPONEMAL ABS: REACTIVE — AB

## 2018-03-19 MED ORDER — EMTRICITABINE-TENOFOVIR AF 200-25 MG PO TABS: 1.0000 | ORAL_TABLET | Freq: Every day | ORAL | 2 refills | Status: DC

## 2018-03-19 NOTE — Telephone Encounter (Signed)
Called patient to let him know that his HIV antibody was negative.  Will send in 3 more months of Descovy.  

## 2018-03-20 LAB — CYTOLOGY, (ORAL, ANAL, URETHRAL) ANCILLARY ONLY
Chlamydia: NEGATIVE
Chlamydia: POSITIVE — AB
NEISSERIA GONORRHEA: NEGATIVE
Neisseria Gonorrhea: POSITIVE — AB

## 2018-03-20 LAB — URINE CYTOLOGY ANCILLARY ONLY
Chlamydia: NEGATIVE
Neisseria Gonorrhea: NEGATIVE

## 2018-03-20 MED FILL — DESCOVY 200-25 MG TABS: 200-25 | 30 days supply | Qty: 30 | Fill #0

## 2018-03-21 ENCOUNTER — Telehealth: Payer: Self-pay | Admitting: Pharmacist

## 2018-03-21 NOTE — Telephone Encounter (Signed)
Patient's rectal swab came back positive for gonorrhea and chlamydia.  He is insured, so he will come in tomorrow morning for treatment and will need:  - Ceftriaxone 250 mg IM x 1 - Azithromycin 1 gm PO x 1

## 2018-03-22 ENCOUNTER — Ambulatory Visit (INDEPENDENT_AMBULATORY_CARE_PROVIDER_SITE_OTHER): Payer: BLUE CROSS/BLUE SHIELD | Admitting: Behavioral Health

## 2018-03-22 DIAGNOSIS — A549 Gonococcal infection, unspecified: Secondary | ICD-10-CM | POA: Diagnosis not present

## 2018-03-22 DIAGNOSIS — A749 Chlamydial infection, unspecified: Secondary | ICD-10-CM

## 2018-03-22 MED ORDER — AZITHROMYCIN 250 MG PO TABS
1000.0000 mg | ORAL_TABLET | Freq: Once | ORAL | Status: AC
  Administered 2018-03-22: 1000 mg via ORAL

## 2018-03-22 MED ORDER — CEFTRIAXONE SODIUM 250 MG IJ SOLR
250.0000 mg | Freq: Once | INTRAMUSCULAR | Status: AC
  Administered 2018-03-22: 250 mg via INTRAMUSCULAR

## 2018-03-22 NOTE — Progress Notes (Signed)
Patient tolerated both oral Azithromycin and IM Rocephin.  Offered condoms and patient refused.  Also education given to patient about transmission.  Patient informed to inform partners to be tested and treated at the [facility] department.  Patient verbalized understanding.  Janyce Llanos present as Chaperone for IM injection. Pricilla Riffle RN

## 2018-04-23 ENCOUNTER — Other Ambulatory Visit: Payer: Self-pay | Admitting: Pharmacist

## 2018-04-23 DIAGNOSIS — Z7252 High risk homosexual behavior: Secondary | ICD-10-CM

## 2018-04-23 MED ORDER — EMTRICITABINE-TENOFOVIR AF 200-25 MG PO TABS: 1.0000 | ORAL_TABLET | Freq: Every day | ORAL | 1 refills | Status: DC

## 2018-04-24 ENCOUNTER — Telehealth: Payer: Self-pay | Admitting: Pharmacist

## 2018-04-24 NOTE — Telephone Encounter (Signed)
Submitted PA for patient's Descovy for PrEP.

## 2018-04-25 NOTE — Telephone Encounter (Signed)
PA for Descovy was approved until 04/25/2019.

## 2018-06-04 ENCOUNTER — Ambulatory Visit (HOSPITAL_COMMUNITY)
Admission: EM | Admit: 2018-06-04 | Discharge: 2018-06-04 | Disposition: A | Discharge status: Home / Self Care | Payer: BLUE CROSS/BLUE SHIELD | Attending: Family Medicine | Admitting: Family Medicine

## 2018-06-04 ENCOUNTER — Encounter (HOSPITAL_COMMUNITY): Payer: Self-pay | Admitting: Emergency Medicine

## 2018-06-04 DIAGNOSIS — L249 Irritant contact dermatitis, unspecified cause: Secondary | ICD-10-CM

## 2018-06-04 MED ORDER — TRIAMCINOLONE ACETONIDE 0.1 % EX CREA: 1.0000 "application " | TOPICAL_CREAM | Freq: Two times a day (BID) | CUTANEOUS | 0 refills | Status: DC

## 2018-06-04 NOTE — ED Provider Notes (Signed)
[place]   [id number] 06/04/18 Arrival Time: 88  CC: SKIN COMPLAINT  SUBJECTIVE:  QURON RUDDY is a 53 y.o. male who presents with a skin complaint that began 2 days ago.  Denies precipitating event or trauma, speculates possible bug bite.  Denies changes in soaps, detergents, close contacts with similar rash, known trigger or environmental trigger, allergy. Denies medications change or starting a new medication recently.  Localizes the rash to left upper thigh.  Describes it as burning and itching in character.  Has NOT tried OTC medications.  Symptoms are made worse with itching.  Denies similar symptoms in the past.   Complains of associated swelling, redness, and warm to the touch.  Denies fever, chills, nausea, vomiting, SOB, chest pain, abdominal pain, changes in bowel or bladder function.    ROS: As per HPI.  Past Medical History:  Diagnosis Date  . Acid reflux   . Chronic back pain    Past Surgical History:  Procedure Laterality Date  . CHOLECYSTECTOMY N/A 11/18/2013   Procedure: LAPAROSCOPIC CHOLECYSTECTOMY;  Surgeon: Scherry Ran, MD;  Location: [facility];  Service: General;  Laterality: N/A;  . ESOPHAGOGASTRODUODENOSCOPY  2005  . SCROTAL EXPLORATION Left 06/23/2016   Procedure: LEFT SCROTAL EXPLORATION WITH EXCISION OF EPIDIDYMAL MASS;  Surgeon: Irine Seal, MD;  Location: [facility];  Service: Urology;  Laterality: Left;   Allergies  Allergen Reactions  . Tylenol [Acetaminophen] Nausea And Vomiting    Vomiting every time he takes tylenol  . Codeine Hives, Itching and Rash   No current facility-administered medications on file prior to encounter.    Current Outpatient Medications on File Prior to Encounter  Medication Sig Dispense Refill  . aspirin-acetaminophen-caffeine (EXCEDRIN MIGRAINE) 250-250-65 MG tablet Take 3-4 tablets by mouth every 8 (eight) hours as needed (for migraine headache.).    Marland Kitchen b complex vitamins tablet Take 1 tablet by mouth daily.     . cholecalciferol (VITAMIN D) 400 units TABS tablet Take 400 Units by mouth daily.    Marland Kitchen emtricitabine-tenofovir AF (DESCOVY) 200-25 MG tablet Take 1 tablet by mouth daily. 30 tablet 1  . ibuprofen (ADVIL,MOTRIN) 200 MG tablet Take 400-600 mg by mouth every 8 (eight) hours as needed (for migraine headaches.).    Marland Kitchen Multiple Vitamin (MULTIVITAMIN WITH MINERALS) TABS tablet Take 1 tablet by mouth daily.    Marland Kitchen omeprazole (PRILOSEC OTC) 20 MG tablet Take 20 mg by mouth at bedtime.     . vitamin C (ASCORBIC ACID) 500 MG tablet Take 1,000 mg by mouth daily.     Social History   Socioeconomic History  . Marital status: Single    Spouse name: Not on file  . Number of children: Not on file  . Years of education: Not on file  . Highest education level: Not on file  Occupational History  . Not on file  Social Needs  . Financial resource strain: Not on file  . Food insecurity:    Worry: Not on file    Inability: Not on file  . Transportation needs:    Medical: Not on file    Non-medical: Not on file  Tobacco Use  . Smoking status: Former Smoker    Last attempt to quit: 11/15/2010    Years since quitting: 7.5  . Smokeless tobacco: Never Used  Substance and Sexual Activity  . Alcohol use: Yes    Alcohol/week: 3.0 standard drinks    Types: 3 Shots of liquor per week    Comment:  3 drink a day  . Drug use: No  . Sexual activity: Not on file  Lifestyle  . Physical activity:    Days per week: Not on file    Minutes per session: Not on file  . Stress: Not on file  Relationships  . Social connections:    Talks on phone: Not on file    Gets together: Not on file    Attends religious service: Not on file    Active member of club or organization: Not on file    Attends meetings of clubs or organizations: Not on file    Relationship status: Not on file  . Intimate partner violence:    Fear of current or ex partner: Not on file    Emotionally abused: Not on file    Physically abused: Not on  file    Forced sexual activity: Not on file  Other Topics Concern  . Not on file  Social History Narrative  . Not on file   No family history on file.  OBJECTIVE: Vitals:   06/04/18 1221 06/04/18 1222  BP:  124/80  Pulse: 73   Resp: 16   Temp: 97.8 F (36.6 C)   SpO2: 99%     General appearance: alert; no distress Head: NCAT Lungs: normal respiratory  Extremities: no edema Skin: warm and dry; 3 cm erythematous papular rash localized to LT upper thigh; mildly TTP; subtle blanching with pressure; no obvious drainage or bleeding; no other skin manifestations about the L2-4 dermtomes (see picture below) Psychological: alert and cooperative; normal mood and affect      ASSESSMENT & PLAN:  1. Irritant contact dermatitis, unspecified trigger    Discussed patient case with Dr. Mannie Stabile.  Will treat for possible contact dermatitis.  Steroid cream prescribed.  Given strict return and ED precautions.     Meds ordered this encounter  Medications  . triamcinolone cream (KENALOG) 0.1 %    Sig: Apply 1 application topically 2 (two) times daily.    Dispense:  30 g    Refill:  0    Order Specific Question:   Supervising Provider    Answer:   Raylene Everts [[id number]]   Steroid cream prescribed.  Use as directed  Wash with warm water and mild soap Keep covered to avoid friction Avoid scratching as this may lead to secondary infection Follow up with PCP if symptoms persists Return or go to the ER if you have any new or worsening symptoms such as fever, chills, nausea, vomiting, redness, swelling, discharge, if symptoms do not improve with medications, etc...  Reviewed expectations re: course of current medical issues. Questions answered. Outlined signs and symptoms indicating need for more acute intervention. Patient verbalized understanding. After Visit Summary given.   Lestine Box, PA-C 06/04/18 1319

## 2018-06-04 NOTE — Discharge Instructions (Signed)
Steroid cream prescribed.  Use as directed  Wash with warm water and mild soap Keep covered to avoid friction Avoid scratching as this may lead to secondary infection Follow up with PCP if symptoms persists Return or go to the ER if you have any new or worsening symptoms such as fever, chills, nausea, vomiting, redness, swelling, discharge, if symptoms do not improve with medications, etc..Marland Kitchen

## 2018-06-04 NOTE — ED Triage Notes (Signed)
Pt states Sunday morning his leg was itching, states he has two possible bite marks on his L upper leg, states now its draining and hot.

## 2018-06-13 ENCOUNTER — Ambulatory Visit (INDEPENDENT_AMBULATORY_CARE_PROVIDER_SITE_OTHER): Payer: BLUE CROSS/BLUE SHIELD | Admitting: Pharmacist

## 2018-06-13 ENCOUNTER — Other Ambulatory Visit (HOSPITAL_COMMUNITY)
Admission: RE | Admit: 2018-06-13 | Discharge: 2018-06-13 | Disposition: A | Discharge status: Home / Self Care | Payer: BLUE CROSS/BLUE SHIELD | Source: Ambulatory Visit | Attending: Infectious Disease | Admitting: Infectious Disease

## 2018-06-13 ENCOUNTER — Telehealth: Payer: Self-pay | Admitting: Pharmacy Technician

## 2018-06-13 DIAGNOSIS — Z7252 High risk homosexual behavior: Secondary | ICD-10-CM

## 2018-06-13 NOTE — Progress Notes (Signed)
Date:  06/13/2018   HPI: Curtis Trevino is a 53 y.o. male who presents to the [place] clinic for 3 month PrEP follow-up.  Insured   []    Uninsured  []    Patient Active Problem List   Diagnosis Date Noted  . Cholecystitis with cholelithiasis 11/18/2013    Patient's Medications  New Prescriptions   No medications on file  Previous Medications   ASPIRIN-ACETAMINOPHEN-CAFFEINE (EXCEDRIN MIGRAINE) 250-250-65 MG TABLET    Take 3-4 tablets by mouth every 8 (eight) hours as needed (for migraine headache.).   B COMPLEX VITAMINS TABLET    Take 1 tablet by mouth daily.   CHOLECALCIFEROL (VITAMIN D) 400 UNITS TABS TABLET    Take 400 Units by mouth daily.   EMTRICITABINE-TENOFOVIR AF (DESCOVY) 200-25 MG TABLET    Take 1 tablet by mouth daily.   IBUPROFEN (ADVIL,MOTRIN) 200 MG TABLET    Take 400-600 mg by mouth every 8 (eight) hours as needed (for migraine headaches.).   MULTIPLE VITAMIN (MULTIVITAMIN WITH MINERALS) TABS TABLET    Take 1 tablet by mouth daily.   OMEPRAZOLE (PRILOSEC OTC) 20 MG TABLET    Take 20 mg by mouth at bedtime.    TRIAMCINOLONE CREAM (KENALOG) 0.1 %    Apply 1 application topically 2 (two) times daily.   VITAMIN C (ASCORBIC ACID) 500 MG TABLET    Take 1,000 mg by mouth daily.  Modified Medications   No medications on file  Discontinued Medications   No medications on file    Allergies: Allergies  Allergen Reactions  . Tylenol [Acetaminophen] Nausea And Vomiting    Vomiting every time he takes tylenol  . Codeine Hives, Itching and Rash    Past Medical History: Past Medical History:  Diagnosis Date  . Acid reflux   . Chronic back pain     Social History: Social History   Socioeconomic History  . Marital status: Single    Spouse name: Not on file  . Number of children: Not on file  . Years of education: Not on file  . Highest education level: Not on file  Occupational History  . Not on file  Social Needs  . Financial resource strain: Not on  file  . Food insecurity:    Worry: Not on file    Inability: Not on file  . Transportation needs:    Medical: Not on file    Non-medical: Not on file  Tobacco Use  . Smoking status: Former Smoker    Last attempt to quit: 11/15/2010    Years since quitting: 7.5  . Smokeless tobacco: Never Used  Substance and Sexual Activity  . Alcohol use: Yes    Alcohol/week: 3.0 standard drinks    Types: 3 Shots of liquor per week    Comment: 3 drink a day  . Drug use: No  . Sexual activity: Not on file  Lifestyle  . Physical activity:    Days per week: Not on file    Minutes per session: Not on file  . Stress: Not on file  Relationships  . Social connections:    Talks on phone: Not on file    Gets together: Not on file    Attends religious service: Not on file    Active member of club or organization: Not on file    Attends meetings of clubs or organizations: Not on file    Relationship status: Not on file  Other Topics Concern  . Not on file  Social History Narrative  . Not on file    [facility] HIV PREP FLOWSHEET RESULTS 06/13/2018 03/18/2018 12/17/2017 09/18/2017 08/23/2017  Insurance Status Insured Insured Insured - Insured  Gender at birth Male Male Male Male Male  Gender identity cis-Male cis-Male cis-Male cis-Male cis-Male  Risk for HIV Hx of STI;Condomless vaginal or anal intercourse >5 partners in past 6 mos (regardless of condom use);Hx of STI;Condomless vaginal or anal intercourse In sexual relationship with HIV+ partner;Condomless vaginal or anal intercourse;>5 partners in past 6 mos (regardless of condom use);Hx of STI >5 partners in past 6 mos (regardless of condom use) Condomless vaginal or anal intercourse;>5 partners in past 6 mos (regardless of condom use)  Sex Partners Men only Men only Men only Men only Men only  # sex partners past 3-6 mos 1-3 1-3 1-3 1-3 10-12  Sex activity preferences Insertive and receptive;Oral Insertive and receptive;Oral Insertive and receptive;Oral  Receptive;Insertive;Oral Insertive and receptive;Oral  Condom use No No No Yes Yes  % condom use - - - 92 65  Partners genders and ages - M 25-29 M 20-24 M 30-49;M 25-29 M 50+;M 30-49  Treated for STI? No No No No No  HIV symptoms? N/A N/A N/A - N/A  PrEP Eligibility Substantial risk for HIV Substantial risk for HIV;Past treatment for potential exposure to HIV from sex or injectable drugs - HIV negative;Substantial risk for HIV Substantial risk for HIV    Labs:  SCr: Lab Results  Component Value Date   CREATININE 1.33 03/18/2018   CREATININE 1.50 (H) 12/17/2017   CREATININE 1.24 08/23/2017   CREATININE 1.24 05/16/2017   CREATININE 1.21 06/22/2016   HIV Lab Results  Component Value Date   HIV NON-REACTIVE 03/18/2018   HIV NON-REACTIVE 12/17/2017   HIV NON-REACTIVE 09/18/2017   HIV NON-REACTIVE 08/23/2017   Hepatitis B Lab Results  Component Value Date   HEPBSAB NON-REACTIVE 08/23/2017   HEPBSAG NON-REACTIVE 08/23/2017   Hepatitis C Lab Results  Component Value Date   HEPCAB NON-REACTIVE 08/23/2017   Hepatitis A Lab Results  Component Value Date   HAV NON-REACTIVE 08/23/2017   RPR and STI Lab Results  Component Value Date   LABRPR REACTIVE (A) 03/18/2018   LABRPR REACTIVE (A) 09/18/2017   LABRPR REACTIVE (A) 08/23/2017   RPRTITER 1:8 (H) 03/18/2018   RPRTITER 1:32 (H) 09/18/2017   RPRTITER 1:32 (H) 08/23/2017    STI Results GC CT  03/18/2018 Negative Negative  03/18/2018 Negative Negative  03/18/2018 **POSITIVE**(A) **POSITIVE**(A)  09/18/2017 Negative Negative  09/18/2017 Negative Negative  09/18/2017 Negative Negative  08/23/2017 Negative Negative  08/23/2017 Negative Negative  08/23/2017 **POSITIVE**(A) Negative    Assessment: Curtis Trevino is here today for his 3 month PrEP follow-up appointment and labs. He remains on Descovy and has missed no doses.  He states that any side effects he was noticing at the beginning have now resolved and he is tolerating it  well. His insurance plan did change and now he has to get his Descovy through [place], which he is not entirely pleased with.  He remains with his one partner who he lives with now and who I also see for PrEP. They do not always use condoms.  He is in the process of trying to adopt his 92 and 26 year old nephews while his sister is trying to get clean from drugs.  He is very excited about this and is hoping the adoption process goes through smoothly.  Will recheck STDs today as he was positive for gonorrhea  and chlamydia at last visit in December. If he remains monogamous with his partner, we can probably do yearly STD testing. Will see him back in 3 months.  Plan: - HIV antibody, BMET, RPR, and urine/oral/rectal gonorrhea/chlamydia cytology today - Descovy x 3 months if HIV negative - F/u with me again 6/3 at 145pm  Lorrain Rivers L. Constantina Laseter, PharmD, BCIDP, AAHIVP, [place] for Infectious Disease 06/13/2018, 3:52 PM

## 2018-06-13 NOTE — Telephone Encounter (Signed)
[facility] Patient Advocate Encounter    Findings of the benefits investigation conducted this morning via test claims for the patient's upcoming appointment on 06/13/2018 at 1:45pm are as follows:   Insurance: [employer] active status Test run with drug historically used, Descovy, will run another test claim if new drug prescribed Medication being filled at [facility] in [place], last refilled 05/23/18 and refillable on or after 06/15/2018  [facility] Patient Advocate will follow up once patient arrives for their appointment and make any necessary changes.  Bartholomew Crews, CPhT Specialty Pharmacy Patient [facility] for Infectious Disease Phone: [phone] Fax: [phone] 06/13/2018 9:05 AM

## 2018-06-14 ENCOUNTER — Telehealth: Payer: Self-pay | Admitting: Pharmacist

## 2018-06-14 DIAGNOSIS — Z7252 High risk homosexual behavior: Secondary | ICD-10-CM

## 2018-06-14 LAB — CYTOLOGY, (ORAL, ANAL, URETHRAL) ANCILLARY ONLY
Chlamydia: NEGATIVE
Chlamydia: NEGATIVE
Neisseria Gonorrhea: NEGATIVE
Neisseria Gonorrhea: NEGATIVE

## 2018-06-14 LAB — BASIC METABOLIC PANEL
BUN: 19 mg/dL (ref 7–25)
CO2: 27 mmol/L (ref 20–32)
Calcium: 9.4 mg/dL (ref 8.6–10.3)
Chloride: 108 mmol/L (ref 98–110)
Creat: 1.25 mg/dL (ref 0.70–1.33)
Glucose, Bld: 94 mg/dL (ref 65–99)
Potassium: 4 mmol/L (ref 3.5–5.3)
Sodium: 143 mmol/L (ref 135–146)

## 2018-06-14 LAB — HIV ANTIBODY (ROUTINE TESTING W REFLEX): HIV 1&2 Ab, 4th Generation: NONREACTIVE

## 2018-06-14 LAB — RPR: RPR Ser Ql: REACTIVE — AB

## 2018-06-14 LAB — FLUORESCENT TREPONEMAL AB(FTA)-IGG-BLD: Fluorescent Treponemal ABS: REACTIVE — AB

## 2018-06-14 LAB — URINE CYTOLOGY ANCILLARY ONLY
CHLAMYDIA, DNA PROBE: NEGATIVE
Neisseria Gonorrhea: NEGATIVE

## 2018-06-14 LAB — RPR TITER

## 2018-06-14 MED ORDER — EMTRICITABINE-TENOFOVIR AF 200-25 MG PO TABS: 1.0000 | ORAL_TABLET | Freq: Every day | ORAL | 2 refills | Status: DC

## 2018-06-14 NOTE — Telephone Encounter (Signed)
Called patient to let him know that his HIV antibody was negative.  Will send in 3 more months of Descovy.  

## 2018-06-22 ENCOUNTER — Other Ambulatory Visit: Payer: Self-pay | Admitting: Pharmacist

## 2018-06-22 DIAGNOSIS — Z7252 High risk homosexual behavior: Secondary | ICD-10-CM

## 2018-07-04 ENCOUNTER — Telehealth: Payer: BLUE CROSS/BLUE SHIELD | Admitting: Family

## 2018-07-04 DIAGNOSIS — R059 Cough, unspecified: Secondary | ICD-10-CM

## 2018-07-04 DIAGNOSIS — R05 Cough: Secondary | ICD-10-CM

## 2018-07-04 MED ORDER — PROMETHAZINE-DM 6.25-15 MG/5ML PO SYRP: 5.0000 mL | ORAL_SOLUTION | Freq: Four times a day (QID) | ORAL | 0 refills | Status: DC | PRN

## 2018-07-04 NOTE — Progress Notes (Signed)

## 2018-08-08 ENCOUNTER — Ambulatory Visit (INDEPENDENT_AMBULATORY_CARE_PROVIDER_SITE_OTHER): Payer: BLUE CROSS/BLUE SHIELD

## 2018-08-08 ENCOUNTER — Encounter (HOSPITAL_COMMUNITY): Payer: Self-pay

## 2018-08-08 ENCOUNTER — Ambulatory Visit (HOSPITAL_COMMUNITY)
Admission: EM | Admit: 2018-08-08 | Discharge: 2018-08-08 | Disposition: A | Discharge status: Home / Self Care | Payer: BLUE CROSS/BLUE SHIELD | Attending: Family Medicine | Admitting: Family Medicine

## 2018-08-08 ENCOUNTER — Other Ambulatory Visit: Payer: Self-pay

## 2018-08-08 DIAGNOSIS — J40 Bronchitis, not specified as acute or chronic: Secondary | ICD-10-CM | POA: Diagnosis not present

## 2018-08-08 MED ORDER — FLUTICASONE PROPIONATE 50 MCG/ACT NA SUSP: 1.0000 | Freq: Every day | NASAL | 2 refills | Status: DC

## 2018-08-08 MED ORDER — CETIRIZINE HCL 10 MG PO TABS: 10.0000 mg | ORAL_TABLET | Freq: Every day | ORAL | 0 refills | Status: DC

## 2018-08-08 MED ORDER — ALBUTEROL SULFATE HFA 108 (90 BASE) MCG/ACT IN AERS: 1.0000 | INHALATION_SPRAY | Freq: Four times a day (QID) | RESPIRATORY_TRACT | 0 refills | Status: DC | PRN

## 2018-08-08 MED ORDER — BENZONATATE 100 MG PO CAPS: 100.0000 mg | ORAL_CAPSULE | Freq: Three times a day (TID) | ORAL | 0 refills | Status: DC

## 2018-08-08 MED ORDER — PREDNISONE 10 MG PO TABS: 40.0000 mg | ORAL_TABLET | Freq: Every day | ORAL | 0 refills | Status: AC

## 2018-08-08 NOTE — Discharge Instructions (Addendum)
Your x ray was normal Treating you for bronchitis Prednisone daily for the next 5 days.  Make sure you take this with food. Albuterol inhaler to use as needed for cough, wheezing, shortness of breath. Tessalon Perles for cough I recommend following up with your doctor for referral to a pulmonologist for possible further evaluation Make sure you are taking allergy medicine daily to include Zyrtec and Flonase

## 2018-08-08 NOTE — ED Provider Notes (Signed)
[place]    CSN: [id number] Arrival date & time: 08/08/18  5009     History   Chief Complaint Chief Complaint  Patient presents with  . Cough    HPI Curtis Trevino is a 53 y.o. male.   Patient is a 53 year old male with no significant past medical history.  He presents today with chronic cough.  This is been going on for what he reports approximately a year.  He has a lot of sputum when he wakes up first thing in the morning.  Symptoms have worsened over the past month with more sputum production, increased cough and congestion.  He is a former smoker.  He has not smoked in approximately 6 years.  He was seen by his primary care provider through ED visit on 3/26 and given some cough medication.  He has finished that cough medication and still having symptoms.  Denies any associated fevers, chills, body aches, night sweats. No sore throat, ear pain.  Denies any recent sick contacts.  No recent traveling.  He works at [employer].    ROS per HPI      Past Medical History:  Diagnosis Date  . Acid reflux   . Chronic back pain     Patient Active Problem List   Diagnosis Date Noted  . Cholecystitis with cholelithiasis 11/18/2013    Past Surgical History:  Procedure Laterality Date  . CHOLECYSTECTOMY N/A 11/18/2013   Procedure: LAPAROSCOPIC CHOLECYSTECTOMY;  Surgeon: Scherry Ran, MD;  Location: [facility];  Service: General;  Laterality: N/A;  . ESOPHAGOGASTRODUODENOSCOPY  2005  . SCROTAL EXPLORATION Left 06/23/2016   Procedure: LEFT SCROTAL EXPLORATION WITH EXCISION OF EPIDIDYMAL MASS;  Surgeon: Irine Seal, MD;  Location: [facility];  Service: Urology;  Laterality: Left;       Home Medications    Prior to Admission medications   Medication Sig Start Date End Date Taking? Authorizing Provider  albuterol (VENTOLIN HFA) 108 (90 Base) MCG/ACT inhaler Inhale 1-2 puffs into the lungs every 6 (six) hours as needed for wheezing or shortness of breath. 08/08/18   Orvan July, NP  aspirin-acetaminophen-caffeine (EXCEDRIN MIGRAINE) [phone] MG tablet Take 3-4 tablets by mouth every 8 (eight) hours as needed (for migraine headache.).    [provider]  b complex vitamins tablet Take 1 tablet by mouth daily.    [provider]  benzonatate (TESSALON) 100 MG capsule Take 1 capsule (100 mg total) by mouth every 8 (eight) hours. 08/08/18   Loura Halt A, NP  cetirizine (ZYRTEC) 10 MG tablet Take 1 tablet (10 mg total) by mouth daily. 08/08/18   Loura Halt A, NP  cholecalciferol (VITAMIN D) 400 units TABS tablet Take 400 Units by mouth daily.    [provider]  emtricitabine-tenofovir AF (DESCOVY) 200-25 MG tablet Take 1 tablet by mouth daily. 06/14/18   Kuppelweiser, Cassie L, RPH-CPP  fluticasone (FLONASE) 50 MCG/ACT nasal spray Place 1 spray into both nostrils daily. 08/08/18   Loura Halt A, NP  ibuprofen (ADVIL,MOTRIN) 200 MG tablet Take 400-600 mg by mouth every 8 (eight) hours as needed (for migraine headaches.).    [provider]  Multiple Vitamin (MULTIVITAMIN WITH MINERALS) TABS tablet Take 1 tablet by mouth daily.    [provider]  omeprazole (PRILOSEC OTC) 20 MG tablet Take 20 mg by mouth at bedtime.     [provider]  predniSONE (DELTASONE) 10 MG tablet Take 4 tablets (40 mg total) by mouth daily for  5 days. 08/08/18 08/13/18  Loura Halt A, NP  promethazine-dextromethorphan (PROMETHAZINE-DM) 6.25-15 MG/5ML syrup Take 5 mLs by mouth 4 (four) times daily as needed. 07/04/18   Dutch Quint B, FNP  triamcinolone cream (KENALOG) 0.1 % Apply 1 application topically 2 (two) times daily. 06/04/18   Wurst, [place], PA-C  vitamin C (ASCORBIC ACID) 500 MG tablet Take 1,000 mg by mouth daily.    [provider]    Family History History reviewed. No pertinent family history.  Social History Social History   Tobacco Use  . Smoking status: Former Smoker    Last attempt to quit: 11/15/2010    Years  since quitting: 7.7  . Smokeless tobacco: Never Used  Substance Use Topics  . Alcohol use: Yes    Alcohol/week: 3.0 standard drinks    Types: 3 Shots of liquor per week    Comment: 3 drink a day  . Drug use: No     Allergies   Tylenol [acetaminophen] and Codeine   Review of Systems Review of Systems   Physical Exam Triage Vital Signs ED Triage Vitals  Enc Vitals Group     BP 08/08/18 1013 127/83     Pulse Rate 08/08/18 1013 82     Resp 08/08/18 1013 18     Temp 08/08/18 1013 98.9 F (37.2 C)     Temp src --      SpO2 08/08/18 1013 98 %     Weight --      Height --      Head Circumference --      Peak Flow --      Pain Score 08/08/18 1015 6     Pain Loc --      Pain Edu? --      Excl. in [place]? --    No data found.  Updated Vital Signs BP 127/83   Pulse 82   Temp 98.9 F (37.2 C)   Resp 18   SpO2 98%   Visual Acuity Right Eye Distance:   Left Eye Distance:   Bilateral Distance:    Right Eye Near:   Left Eye Near:    Bilateral Near:     Physical Exam Vitals signs and nursing note reviewed.  Constitutional:      General: He is not in acute distress.    Appearance: Normal appearance. He is not ill-appearing, toxic-appearing or diaphoretic.  HENT:     Head: Normocephalic and atraumatic.     Right Ear: Tympanic membrane and ear canal normal.     Left Ear: Tympanic membrane and ear canal normal.     Nose: Nose normal.     Mouth/Throat:     Pharynx: Oropharynx is clear.  Eyes:     Conjunctiva/sclera: Conjunctivae normal.  Neck:     Musculoskeletal: Normal range of motion.  Cardiovascular:     Rate and Rhythm: Normal rate and regular rhythm.     Pulses: Normal pulses.     Heart sounds: Normal heart sounds.  Pulmonary:     Effort: Pulmonary effort is normal. No respiratory distress.     Breath sounds: No stridor. No wheezing, rhonchi or rales.     Comments: Coarse lung sounds in bases Chest:     Chest wall: No tenderness.  Musculoskeletal: Normal  range of motion.  Skin:    General: Skin is warm and dry.  Neurological:     Mental Status: He is alert.  Psychiatric:        Mood  and Affect: Mood normal.      UC Treatments / Results  Labs (all labs ordered are listed, but only abnormal results are displayed) Labs Reviewed - No data to display  EKG None  Radiology Dg Chest 2 View  Result Date: 08/08/2018 CLINICAL DATA:  Dry cough for a month.  No fever. EXAM: CHEST - 2 VIEW COMPARISON:  May 16, 2017 FINDINGS: The heart size and mediastinal contours are within normal limits. Both lungs are clear. The visualized skeletal structures are unremarkable. IMPRESSION: No active cardiopulmonary disease. Electronically Signed   By: Dorise Bullion III M.D   On: 08/08/2018 11:35    Procedures Procedures (including critical care time)  Medications Ordered in UC Medications - No data to display  Initial Impression / Assessment and Plan / UC Course  I have reviewed the triage vital signs and the nursing notes.  Pertinent labs & imaging results that were available during my care of the patient were reviewed by me and considered in my medical decision making (see chart for details).    Cough   Patient is a 53 year old male that presents today with chronic cough that is worsened over the last month.  He has had increased sputum production and worsening cough.  Denies any chest pain or shortness of breath. Denies any fevers He is a former smoker X-ray revealed no acute abnormalities Most likely symptoms are related to bronchitis We will go ahead and treat his symptoms today with prednisone taper over the next 5 days along with albuterol inhaler and Tessalon Perles to use as needed Recommended he follow-up with his primary for referral to a pulmonologist for further evaluation and management They may want to do some pulmonary function tests and prescribe a long-term inhaler to use daily Patient understanding and agreed to plan Final  Clinical Impressions(s) / UC Diagnoses   Final diagnoses:  Bronchitis     Discharge Instructions     Your x ray was normal Treating you for bronchitis Prednisone daily for the next 5 days.  Make sure you take this with food. Albuterol inhaler to use as needed for cough, wheezing, shortness of breath. Tessalon Perles for cough I recommend following up with your doctor for referral to a pulmonologist for possible further evaluation Make sure you are taking allergy medicine daily to include Zyrtec and Flonase     ED Prescriptions    Medication Sig Dispense Auth. Provider   predniSONE (DELTASONE) 10 MG tablet Take 4 tablets (40 mg total) by mouth daily for 5 days. 20 tablet Renarda Mullinix A, NP   albuterol (VENTOLIN HFA) 108 (90 Base) MCG/ACT inhaler Inhale 1-2 puffs into the lungs every 6 (six) hours as needed for wheezing or shortness of breath. 1 Inhaler Xavi Tomasik A, NP   benzonatate (TESSALON) 100 MG capsule Take 1 capsule (100 mg total) by mouth every 8 (eight) hours. 21 capsule Tanisha Lutes A, NP   cetirizine (ZYRTEC) 10 MG tablet Take 1 tablet (10 mg total) by mouth daily. 30 tablet Azalee Weimer A, NP   fluticasone (FLONASE) 50 MCG/ACT nasal spray Place 1 spray into both nostrils daily. 16 g Loura Halt A, NP     Controlled Substance Prescriptions [place] Controlled Substance Registry consulted? Not Applicable   Orvan July, NP 08/08/18 1345

## 2018-08-08 NOTE — ED Triage Notes (Signed)
Pt state he has had chronic cough for past year with worsening over past month and now experiencing SOB and symptoms not relieved with medications

## 2018-08-28 ENCOUNTER — Telehealth: Payer: BLUE CROSS/BLUE SHIELD | Admitting: Family

## 2018-08-28 DIAGNOSIS — Z20822 Contact with and (suspected) exposure to covid-19: Secondary | ICD-10-CM

## 2018-08-28 DIAGNOSIS — Z20828 Contact with and (suspected) exposure to other viral communicable diseases: Secondary | ICD-10-CM

## 2018-08-28 MED ORDER — BENZONATATE 100 MG PO CAPS: 100.0000 mg | ORAL_CAPSULE | Freq: Three times a day (TID) | ORAL | 0 refills | Status: DC | PRN

## 2018-08-28 NOTE — Progress Notes (Signed)
E-Visit for Corona Virus Screening  Based on your current symptoms, you may very well have the virus, however your symptoms are mild. Currently, not all patients are being tested. If the symptoms are mild and there is not a known exposure, performing the test is not indicated.  We are currently only testing high risk patients. Since your symptoms are stable we are not testing.   Approximately 5 minutes was spent documenting and reviewing patient's chart.    You have been enrolled in [facility] for COVID-19. Daily you will receive a questionnaire within the [facility] website. Our COVID-19 response team will be monitoring your responses daily.   Coronavirus disease 2019 (COVID-19) is a respiratory illness that can spread from person to person. The virus that causes COVID-19 is a new virus that was first identified in the country of [place] but is now found in multiple other countries and has spread to the [place].  Symptoms associated with the virus are mild to severe fever, cough, and shortness of breath. There is currently no vaccine to protect against COVID-19, and there is no specific antiviral treatment for the virus.   To be considered HIGH RISK for Coronavirus (COVID-19), you have to meet the following criteria:  . Traveled to [place], [place], [place], [place] or [place]; or in the [place] to [place], [place], [place], or [place]; and have fever, cough, and shortness of breath within the last 2 weeks of travel OR  . Been in close contact with a person diagnosed with COVID-19 within the last 2 weeks and have fever, cough, and shortness of breath  . IF YOU DO NOT MEET THESE CRITERIA, YOU ARE CONSIDERED LOW RISK FOR COVID-19.   It is vitally important that if you feel that you have an infection such as this virus or any other virus that you stay home and away from places where you may spread it to others.  You should self-quarantine for 14 days if you have symptoms  that could potentially be coronavirus and avoid contact with people age 88 and older.   You can use medication such as A prescription cough medication called Tessalon Perles 100 mg. You may take 1-2 capsules every 8 hours as needed for cough.  You may also take acetaminophen (Tylenol) as needed for fever.   Reduce your risk of any infection by using the same precautions used for avoiding the common cold or flu:  Marland Kitchen Wash your hands often with soap and warm water for at least 20 seconds.  If soap and water are not readily available, use an alcohol-based hand sanitizer with at least 60% alcohol.  . If coughing or sneezing, cover your mouth and nose by coughing or sneezing into the elbow areas of your shirt or coat, into a tissue or into your sleeve (not your hands). . Avoid shaking hands with others and consider head nods or verbal greetings only. . Avoid touching your eyes, nose, or mouth with unwashed hands.  . Avoid close contact with people who are sick. . Avoid places or events with large numbers of people in one location, like concerts or sporting events. . Carefully consider travel plans you have or are making. . If you are planning any travel outside or inside the [place], visit the CDC's Travelers' Health webpage for the latest health notices. . If you have some symptoms but not all symptoms, continue to monitor at home and seek medical attention if your symptoms worsen. . If  you are having a medical emergency, call 911.  HOME CARE . Only take medications as instructed by your medical team. . Drink plenty of fluids and get plenty of rest. . A steam or ultrasonic humidifier can help if you have congestion.   GET HELP RIGHT AWAY IF: . You develop worsening fever. . You become short of breath . You cough up blood. . Your symptoms become more severe MAKE SURE YOU   Understand these instructions.  Will watch your condition.  Will get help right away if you are not doing well or get  worse.  Your e-visit answers were reviewed by a board certified advanced clinical practitioner to complete your personal care plan.  Depending on the condition, your plan could have included both over the counter or prescription medications.  If there is a problem please reply once you have received a response from your provider. Your safety is important to [place].  If you have drug allergies check your prescription carefully.    You can use MyChart to ask questions about today's visit, request a non-urgent call back, or ask for a work or school excuse for 24 hours related to this e-Visit. If it has been greater than 24 hours you will need to follow up with your provider, or enter a new e-Visit to address those concerns. You will get an e-mail in the next two days asking about your experience.  I hope that your e-visit has been valuable and will speed your recovery. Thank you for using e-visits.

## 2018-09-06 ENCOUNTER — Telehealth: Payer: BLUE CROSS/BLUE SHIELD | Admitting: Family

## 2018-09-06 DIAGNOSIS — R0602 Shortness of breath: Secondary | ICD-10-CM

## 2018-09-06 DIAGNOSIS — R05 Cough: Secondary | ICD-10-CM

## 2018-09-06 DIAGNOSIS — R059 Cough, unspecified: Secondary | ICD-10-CM

## 2018-09-06 DIAGNOSIS — Z20828 Contact with and (suspected) exposure to other viral communicable diseases: Secondary | ICD-10-CM

## 2018-09-06 DIAGNOSIS — Z20822 Contact with and (suspected) exposure to covid-19: Secondary | ICD-10-CM

## 2018-09-06 MED ORDER — ALBUTEROL SULFATE HFA 108 (90 BASE) MCG/ACT IN AERS: 2.0000 | INHALATION_SPRAY | RESPIRATORY_TRACT | 0 refills | Status: DC | PRN

## 2018-09-06 MED ORDER — BENZONATATE 100 MG PO CAPS: 100.0000 mg | ORAL_CAPSULE | Freq: Three times a day (TID) | ORAL | 0 refills | Status: DC | PRN

## 2018-09-06 NOTE — Progress Notes (Signed)
Greater than 5 minutes, yet less than 10 minutes of time have been spent researching, coordinating, and implementing care for this patient today.  Thank you for the details you included in the comment boxes. Those details are very helpful in determining the best course of treatment for you and help [place] to provide the best care.  [facility] has drive-up testing. I will call them now and add you to the list and they will reach out to you for an appointment.   E-Visit for Corona Virus Screening  Based on your current symptoms, you may very well have the virus, however your symptoms are mild. Currently, not all patients are being tested. If the symptoms are mild and there is not a known exposure, performing the test is not indicated.  You have been enrolled in [facility] for COVID-19. Daily you will receive a questionnaire within the [facility] website. Our COVID-19 response team will be monitoring your responses daily.   Coronavirus disease 2019 (COVID-19) is a respiratory illness that can spread from person to person. The virus that causes COVID-19 is a new virus that was first identified in the country of [place] but is now found in multiple other countries and has spread to the [place].  Symptoms associated with the virus are mild to severe fever, cough, and shortness of breath. There is currently no vaccine to protect against COVID-19, and there is no specific antiviral treatment for the virus.   To be considered HIGH RISK for Coronavirus (COVID-19), you have to meet the following criteria:  . Traveled to [place], [place], [place], [place] or [place]; or in the [place] to [place], [place], [place], or [place]; and have fever, cough, and shortness of breath within the last 2 weeks of travel OR  . Been in close contact with a person diagnosed with COVID-19 within the last 2 weeks and have fever, cough, and shortness of breath  . IF YOU DO NOT MEET THESE CRITERIA, YOU ARE  CONSIDERED LOW RISK FOR COVID-19.   It is vitally important that if you feel that you have an infection such as this virus or any other virus that you stay home and away from places where you may spread it to others.  You should self-quarantine for 14 days if you have symptoms that could potentially be coronavirus and avoid contact with people age 21 and older.   You can use medication such as A prescription cough medication called Tessalon Perles 100 mg. You may take 1-2 capsules every 8 hours as needed for cough and A prescription inhaler called Albuterol MDI 90 mcg /actuation 2 puffs every 4 hours as needed for shortness of breath, wheezing, cough  You may also take acetaminophen (Tylenol) as needed for fever.   Reduce your risk of any infection by using the same precautions used for avoiding the common cold or flu:  Marland Kitchen Wash your hands often with soap and warm water for at least 20 seconds.  If soap and water are not readily available, use an alcohol-based hand sanitizer with at least 60% alcohol.  . If coughing or sneezing, cover your mouth and nose by coughing or sneezing into the elbow areas of your shirt or coat, into a tissue or into your sleeve (not your hands). . Avoid shaking hands with others and consider head nods or verbal greetings only. . Avoid touching your eyes, nose, or mouth with unwashed hands.  . Avoid close contact with people who are sick. Marland Kitchen  Avoid places or events with large numbers of people in one location, like concerts or sporting events. . Carefully consider travel plans you have or are making. . If you are planning any travel outside or inside the [place], visit the CDC's Travelers' Health webpage for the latest health notices. . If you have some symptoms but not all symptoms, continue to monitor at home and seek medical attention if your symptoms worsen. . If you are having a medical emergency, call 911.  HOME CARE . Only take medications as instructed by your medical  team. . Drink plenty of fluids and get plenty of rest. . A steam or ultrasonic humidifier can help if you have congestion.   GET HELP RIGHT AWAY IF: . You develop worsening fever. . You become short of breath . You cough up blood. . Your symptoms become more severe MAKE SURE YOU   Understand these instructions.  Will watch your condition.  Will get help right away if you are not doing well or get worse.  Your e-visit answers were reviewed by a board certified advanced clinical practitioner to complete your personal care plan.  Depending on the condition, your plan could have included both over the counter or prescription medications.  If there is a problem please reply once you have received a response from your provider. Your safety is important to [place].  If you have drug allergies check your prescription carefully.    You can use MyChart to ask questions about today's visit, request a non-urgent call back, or ask for a work or school excuse for 24 hours related to this e-Visit. If it has been greater than 24 hours you will need to follow up with your provider, or enter a new e-Visit to address those concerns. You will get an e-mail in the next two days asking about your experience.  I hope that your e-visit has been valuable and will speed your recovery. Thank you for using e-visits.

## 2018-09-10 ENCOUNTER — Telehealth: Payer: Self-pay | Admitting: Pharmacist

## 2018-09-10 DIAGNOSIS — Z7252 High risk homosexual behavior: Secondary | ICD-10-CM

## 2018-09-10 MED ORDER — EMTRICITABINE-TENOFOVIR AF 200-25 MG PO TABS: 1.0000 | ORAL_TABLET | Freq: Every day | ORAL | 0 refills | Status: DC

## 2018-09-10 NOTE — Telephone Encounter (Signed)
Curtis Trevino called me this morning regarding his appointment for PrEP follow-up tomorrow.  He states that he and his partner tested positive for COVID 19 and are in quarantine.  He states that he has a few weeks left of [place] but will need to push out his appointment. I told him that he wouldn't be allowed to come in for several weeks. Will send in a refill for him to last for at least a month. He will call me in a few weeks to reschedule.

## 2018-09-11 ENCOUNTER — Ambulatory Visit: Payer: BLUE CROSS/BLUE SHIELD | Admitting: Pharmacist

## 2018-10-24 ENCOUNTER — Other Ambulatory Visit: Payer: Self-pay | Admitting: Pharmacist

## 2018-10-24 DIAGNOSIS — Z7252 High risk homosexual behavior: Secondary | ICD-10-CM

## 2018-12-09 ENCOUNTER — Ambulatory Visit (INDEPENDENT_AMBULATORY_CARE_PROVIDER_SITE_OTHER): Payer: BC Managed Care – PPO

## 2018-12-09 ENCOUNTER — Encounter (HOSPITAL_COMMUNITY): Payer: Self-pay | Admitting: Emergency Medicine

## 2018-12-09 ENCOUNTER — Ambulatory Visit (HOSPITAL_COMMUNITY)
Admission: EM | Admit: 2018-12-09 | Discharge: 2018-12-09 | Disposition: A | Discharge status: Home / Self Care | Payer: BC Managed Care – PPO

## 2018-12-09 ENCOUNTER — Ambulatory Visit (INDEPENDENT_AMBULATORY_CARE_PROVIDER_SITE_OTHER): Payer: BC Managed Care – PPO | Admitting: Family Medicine

## 2018-12-09 ENCOUNTER — Other Ambulatory Visit: Payer: Self-pay

## 2018-12-09 VITALS — BP 124/82 | Ht 69.0 in | Wt 185.0 lb

## 2018-12-09 DIAGNOSIS — M5442 Lumbago with sciatica, left side: Secondary | ICD-10-CM | POA: Diagnosis not present

## 2018-12-09 DIAGNOSIS — M5441 Lumbago with sciatica, right side: Secondary | ICD-10-CM

## 2018-12-09 MED ORDER — HYDROCODONE-ACETAMINOPHEN 5-325 MG PO TABS: 1.0000 | ORAL_TABLET | Freq: Four times a day (QID) | ORAL | 0 refills | Status: DC | PRN

## 2018-12-09 MED ORDER — CYCLOBENZAPRINE HCL 5 MG PO TABS: 5.0000 mg | ORAL_TABLET | Freq: Two times a day (BID) | ORAL | 0 refills | Status: DC | PRN

## 2018-12-09 MED ORDER — KETOROLAC TROMETHAMINE 30 MG/ML IJ SOLN
INTRAMUSCULAR | Status: AC
  Filled 2018-12-09: qty 1

## 2018-12-09 MED ORDER — PREDNISONE 50 MG PO TABS: 50.0000 mg | ORAL_TABLET | Freq: Every day | ORAL | 0 refills | Status: AC

## 2018-12-09 MED ORDER — KETOROLAC TROMETHAMINE 30 MG/ML IJ SOLN
30.0000 mg | Freq: Once | INTRAMUSCULAR | Status: AC
  Administered 2018-12-09: 30 mg via INTRAMUSCULAR

## 2018-12-09 NOTE — Discharge Instructions (Addendum)
Xray normal We gave you Toradol before you left Begin prednisone daily for 5 days, take with food You may use flexeril as needed to help with pain. This is a muscle relaxer and causes sedation- please use only at bedtime or when you will be home and not have to drive/work Hydrocodone for severe pain- do not drive or work after taking Avoid complete bedrest, also avoid heavy lifting  Please follow-up with sports medicine if symptoms persisting for further outpatient imaging  Please go to the emergency room if you develop difficulty controlling urination, bowel movements, numbness in groin, weakness in legs or fever

## 2018-12-09 NOTE — Patient Instructions (Signed)
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 Ok to take tylenol for baseline pain relief (1-2 extra strength tabs 3x/day) A prednisone dose pack is the best option for immediate relief and may be prescribed - don't take aleve or ibuprofen when on this medicine. Norco as needed for severe pain (no driving on this medicine). Flexeril as needed for muscle spasms (no driving on this medicine if it makes you sleepy). Stay as active as possible. Physical therapy has been shown to be helpful as well but would save this for the future if necessary. Strengthening of low back muscles, abdominal musculature are key for long term pain relief - wait a few days before starting this. If not improving, will consider further imaging (MRI). Follow up with me in 1 month but call me in a week to let me know how you're doing.

## 2018-12-09 NOTE — ED Triage Notes (Signed)
Patient has back pain.  Patient has a distant history of back pain.  Patient bent over to pick up socks in basket and pain started and will not stop.  Pain into both legs, particularly the right leg.  Patient reports decreased urine output.  Since pain started.  pcp in [place]

## 2018-12-09 NOTE — ED Provider Notes (Signed)
[facility]    CSN: [id number] Arrival date & time: 12/09/18  [id number]      History   Chief Complaint Chief Complaint  Patient presents with   Appointment    8:30 am   Back Pain    HPI Curtis Trevino is a 53 y.o. male history of GERD, presenting today for evaluation of back pain.  Patient states that on Thursday a.m., 5 days ago he bent over to pick up some socks and felt a pull in his lower back.  Since he has had persistent pain.  This pain has not been relieved over the past 4 to 5 days.  He notes a remote injury while using a pick ax that he has had occasional similar back pain as to what he has felt today.  Pain will radiate into the legs bilaterally.  Denies numbness or tingling.  Denies issue controlling urination or bowel movements, but does feel he has had decreased urination and bowel movements.  He also notes that he has been taking some Vicodin.  He has tried Flexeril without relief.  Denies fevers.  HPI  Past Medical History:  Diagnosis Date   Acid reflux    Chronic back pain     Patient Active Problem List   Diagnosis Date Noted   Cholecystitis with cholelithiasis 11/18/2013    Past Surgical History:  Procedure Laterality Date   CHOLECYSTECTOMY N/A 11/18/2013   Procedure: LAPAROSCOPIC CHOLECYSTECTOMY;  Surgeon: Scherry Ran, MD;  Location: [facility];  Service: General;  Laterality: N/A;   ESOPHAGOGASTRODUODENOSCOPY  2005   SCROTAL EXPLORATION Left 06/23/2016   Procedure: LEFT SCROTAL EXPLORATION WITH EXCISION OF EPIDIDYMAL MASS;  Surgeon: Irine Seal, MD;  Location: [facility];  Service: Urology;  Laterality: Left;       Home Medications    Prior to Admission medications   Medication Sig Start Date End Date Taking? Authorizing Provider  DESCOVY 200-25 MG tablet Take 1 tablet by mouth once daily 10/24/18  Yes Kuppelweiser, Cassie L, RPH-CPP  albuterol (VENTOLIN HFA) 108 (90 Base) MCG/ACT inhaler Inhale 2 puffs into the lungs every 4 (four)  hours as needed for wheezing or shortness of breath. 09/06/18   Withrow, Elyse Jarvis, FNP  cyclobenzaprine (FLEXERIL) 5 MG tablet Take 1-2 tablets (5-10 mg total) by mouth 2 (two) times daily as needed for muscle spasms. 12/09/18   Maahi Lannan C, PA-C  HYDROcodone-acetaminophen (NORCO/VICODIN) 5-325 MG tablet Take 1 tablet by mouth every 6 (six) hours as needed for severe pain. 12/09/18   Mackena Plummer C, PA-C  ibuprofen (ADVIL,MOTRIN) 200 MG tablet Take 400-600 mg by mouth every 8 (eight) hours as needed (for migraine headaches.).    [provider]  predniSONE (DELTASONE) 50 MG tablet Take 1 tablet (50 mg total) by mouth daily with breakfast for 5 days. 12/09/18 12/14/18  Leelynd Maldonado C, PA-C  vitamin C (ASCORBIC ACID) 500 MG tablet Take 1,000 mg by mouth daily.    [provider]  cetirizine (ZYRTEC) 10 MG tablet Take 1 tablet (10 mg total) by mouth daily. 08/08/18 12/09/18  Loura Halt A, NP  fluticasone (FLONASE) 50 MCG/ACT nasal spray Place 1 spray into both nostrils daily. 08/08/18 12/09/18  Loura Halt A, NP  omeprazole (PRILOSEC OTC) 20 MG tablet Take 20 mg by mouth at bedtime.   12/09/18  [provider]    Family History Family History  Problem Relation Age of Onset   Diabetes Mother    Heart failure Mother  Cancer Mother    Heart failure Father    Cancer Father     Social History Social History   Tobacco Use   Smoking status: Former Smoker    Quit date: 11/15/2010    Years since quitting: 8.0   Smokeless tobacco: Never Used  Substance Use Topics   Alcohol use: Yes    Alcohol/week: 3.0 standard drinks    Types: 3 Shots of liquor per week    Comment: 3 drink a day   Drug use: No     Allergies   Tylenol [acetaminophen] and Codeine   Review of Systems Review of Systems  Constitutional: Negative for activity change, chills, diaphoresis and fatigue.  Eyes: Negative for photophobia and visual disturbance.  Respiratory: Negative for  cough, chest tightness and shortness of breath.   Cardiovascular: Negative for chest pain and leg swelling.  Gastrointestinal: Negative for abdominal pain, blood in stool, nausea and vomiting.  Musculoskeletal: Positive for back pain and myalgias. Negative for arthralgias, gait problem, neck pain and neck stiffness.  Skin: Negative for color change and wound.  Neurological: Negative for dizziness, weakness, light-headedness, numbness and headaches.     Physical Exam Triage Vital Signs ED Triage Vitals  Enc Vitals Group     BP 12/09/18 0859 105/85     Pulse Rate 12/09/18 0859 (!) 108     Resp 12/09/18 0859 (!) 24     Temp 12/09/18 0859 98.5 F (36.9 C)     Temp src --      SpO2 --      Weight --      Height --      Head Circumference --      Peak Flow --      Pain Score 12/09/18 0852 8     Pain Loc --      Pain Edu? --      Excl. in [place]? --    No data found.  Updated Vital Signs BP 105/85 (BP Location: Left Arm)    Pulse (!) 108    Temp 98.5 F (36.9 C)    Resp (!) 24   Visual Acuity Right Eye Distance:   Left Eye Distance:   Bilateral Distance:    Right Eye Near:   Left Eye Near:    Bilateral Near:     Physical Exam Vitals signs and nursing note reviewed.  Constitutional:      Appearance: He is well-developed.     Comments: No acute distress  HENT:     Head: Normocephalic and atraumatic.     Nose: Nose normal.  Eyes:     Conjunctiva/sclera: Conjunctivae normal.  Neck:     Musculoskeletal: Neck supple.  Cardiovascular:     Rate and Rhythm: Normal rate.  Pulmonary:     Effort: Pulmonary effort is normal. No respiratory distress.  Abdominal:     General: There is no distension.  Musculoskeletal: Normal range of motion.     Comments: Nontender palpation of cervical and thoracic spine midline, tenderness to lower lumbar region midline.  No reproducible tenderness to lateral lumbar musculature  Strength 5/5 and bilaterally at hips at all directions, knee  strength 5/5 and equal bilaterally Patellar reflex 2+ Positive straight leg raise on right, weakly positive on left  Skin:    General: Skin is warm and dry.  Neurological:     Mental Status: He is alert and oriented to person, place, and time.      UC Treatments / Results  Labs (all labs ordered are listed, but only abnormal results are displayed) Labs Reviewed - No data to display  EKG   Radiology Dg Lumbar Spine Complete  Result Date: 12/09/2018 CLINICAL DATA:  Midline low back pain EXAM: LUMBAR SPINE - COMPLETE 4+ VIEW COMPARISON:  09/24/2014 FINDINGS: Five lumbar type vertebral segments. Chronic endplate irregularities of T12 and L1, unchanged compared to prior 09/24/2014. No evidence of fracture. Anatomic alignment. Intervertebral disc heights are preserved. Minimal lower lumbar facet arthrosis. SI joints unremarkable. IMPRESSION: No acute fracture or malalignment of the lumbar spine. Electronically Signed   By: Davina Poke M.D.   On: 12/09/2018 09:34    Procedures Procedures (including critical care time)  Medications Ordered in UC Medications  ketorolac (TORADOL) 30 MG/ML injection 30 mg (has no administration in time range)    Initial Impression / Assessment and Plan / UC Course  I have reviewed the triage vital signs and the nursing notes.  Pertinent labs & imaging results that were available during my care of the patient were reviewed by me and considered in my medical decision making (see chart for details).    X-ray unremarkable. Low back pain with radicular distribution, no red flags for cauda equina.  Decreased urination/bowel movements likely from use of Vicodin.  Will provide Toradol 30 mg prior to discharge.  Will place on prednisone given radicular distribution.  We will follow-up with sports medicine/orthopedics for further evaluation/imaging if symptoms persisting to rule out herniated disc.  Provide a tablets of hydrocodone to use for severe  pain/nighttime pain.  Discussed strict return precautions. Patient verbalized understanding and is agreeable with plan.  Final Clinical Impressions(s) / UC Diagnoses   Final diagnoses:  Acute midline low back pain with right-sided sciatica     Discharge Instructions     Xray normal We gave you Toradol before you left Begin prednisone daily for 5 days, take with food You may use flexeril as needed to help with pain. This is a muscle relaxer and causes sedation- please use only at bedtime or when you will be home and not have to drive/work Hydrocodone for severe pain- do not drive or work after taking Avoid complete bedrest, also avoid heavy lifting  Please follow-up with sports medicine if symptoms persisting for further outpatient imaging  Please go to the emergency room if you develop difficulty controlling urination, bowel movements, numbness in groin, weakness in legs or fever    ED Prescriptions    Medication Sig Dispense Auth. Provider   predniSONE (DELTASONE) 50 MG tablet Take 1 tablet (50 mg total) by mouth daily with breakfast for 5 days. 5 tablet Nayelly Laughman C, PA-C   cyclobenzaprine (FLEXERIL) 5 MG tablet Take 1-2 tablets (5-10 mg total) by mouth 2 (two) times daily as needed for muscle spasms. 24 tablet Gabi Mcfate C, PA-C   HYDROcodone-acetaminophen (NORCO/VICODIN) 5-325 MG tablet Take 1 tablet by mouth every 6 (six) hours as needed for severe pain. 8 tablet Abdulla Pooley, [place] C, PA-C     Controlled Substance Prescriptions [place] Controlled Substance Registry consulted? Yes, I have consulted the [place] Controlled Substances Registry for this patient, and feel the risk/benefit ratio today is favorable for proceeding with this prescription for a controlled substance.   Janith Lima, [place] 12/09/18 [phone]

## 2018-12-09 NOTE — Progress Notes (Signed)
PCP: Sharilyn Sites, MD  Subjective:   HPI: Patient is a 53 y.o. male here for severe low back pain. He states that 6-7 days ago after he bent over his laundry basket to grab his socks, he experienced severe 6-8/10 sharp pain in middle of his lower back that radiated to his both thighs.  He has had this constant pain, since then, mostly when sitting. He took Vicodin and Flexeril that he had at home with no relief. Denies focal weakness, numbness or tingling of legs or feet, no urinary or fecal incontinency and no saddle anesthesia. His pain has limited his daily activity since then and he was not able to go to work for past couple of days. He went to ED today where lumbar X ray performed. He was told that his X ray was normal and was given Toradol shot, prescribed Prednisone and Norco and recommended to be seen in sport medicine clinic for follow up. He says that after getting Toradol shot today, his pain improved to 2/10.  He mentions that he had similar episode of pain few years ago that resolved gradually without seeking medical attention.  Denies fever, chills, nausea, vomiting, abdominal pain, chest pain, SOB.  Past Medical History:  Diagnosis Date  . Acid reflux   . Chronic back pain     Current Outpatient Medications on File Prior to Visit  Medication Sig Dispense Refill  . cyclobenzaprine (FLEXERIL) 5 MG tablet Take 1-2 tablets (5-10 mg total) by mouth 2 (two) times daily as needed for muscle spasms. 24 tablet 0  . DESCOVY 200-25 MG tablet Take 1 tablet by mouth once daily 30 tablet 0  . HYDROcodone-acetaminophen (NORCO/VICODIN) 5-325 MG tablet Take 1 tablet by mouth every 6 (six) hours as needed for severe pain. 8 tablet 0  . albuterol (VENTOLIN HFA) 108 (90 Base) MCG/ACT inhaler Inhale 2 puffs into the lungs every 4 (four) hours as needed for wheezing or shortness of breath. (Patient not taking: Reported on 12/09/2018) 1 Inhaler 0  . ibuprofen (ADVIL,MOTRIN) 200 MG tablet Take  400-600 mg by mouth every 8 (eight) hours as needed (for migraine headaches.).    Marland Kitchen predniSONE (DELTASONE) 50 MG tablet Take 1 tablet (50 mg total) by mouth daily with breakfast for 5 days. (Patient not taking: Reported on 12/09/2018) 5 tablet 0  . vitamin C (ASCORBIC ACID) 500 MG tablet Take 1,000 mg by mouth daily.    . [DISCONTINUED] cetirizine (ZYRTEC) 10 MG tablet Take 1 tablet (10 mg total) by mouth daily. 30 tablet 0  . [DISCONTINUED] fluticasone (FLONASE) 50 MCG/ACT nasal spray Place 1 spray into both nostrils daily. 16 g 2  . [DISCONTINUED] omeprazole (PRILOSEC OTC) 20 MG tablet Take 20 mg by mouth at bedtime.      No current facility-administered medications on file prior to visit.     Past Surgical History:  Procedure Laterality Date  . CHOLECYSTECTOMY N/A 11/18/2013   Procedure: LAPAROSCOPIC CHOLECYSTECTOMY;  Surgeon: Scherry Ran, MD;  Location: [facility];  Service: General;  Laterality: N/A;  . ESOPHAGOGASTRODUODENOSCOPY  2005  . SCROTAL EXPLORATION Left 06/23/2016   Procedure: LEFT SCROTAL EXPLORATION WITH EXCISION OF EPIDIDYMAL MASS;  Surgeon: Irine Seal, MD;  Location: [facility];  Service: Urology;  Laterality: Left;    Allergies  Allergen Reactions  . Tylenol [Acetaminophen] Nausea And Vomiting    Vomiting every time he takes tylenol  . Codeine Hives, Itching and Rash    Social History   Socioeconomic History  .  Marital status: Single    Spouse name: Not on file  . Number of children: Not on file  . Years of education: Not on file  . Highest education level: Not on file  Occupational History  . Not on file  Social Needs  . Financial resource strain: Not on file  . Food insecurity    Worry: Not on file    Inability: Not on file  . Transportation needs    Medical: Not on file    Non-medical: Not on file  Tobacco Use  . Smoking status: Former Smoker    Quit date: 11/15/2010    Years since quitting: 8.0  . Smokeless tobacco: Never Used  Substance and Sexual  Activity  . Alcohol use: Yes    Alcohol/week: 3.0 standard drinks    Types: 3 Shots of liquor per week    Comment: 3 drink a day  . Drug use: No  . Sexual activity: Not on file  Lifestyle  . Physical activity    Days per week: Not on file    Minutes per session: Not on file  . Stress: Not on file  Relationships  . Social Herbalist on phone: Not on file    Gets together: Not on file    Attends religious service: Not on file    Active member of club or organization: Not on file    Attends meetings of clubs or organizations: Not on file    Relationship status: Not on file  . Intimate partner violence    Fear of current or ex partner: Not on file    Emotionally abused: Not on file    Physically abused: Not on file    Forced sexual activity: Not on file  Other Topics Concern  . Not on file  Social History Narrative  . Not on file    Family History  Problem Relation Age of Onset  . Diabetes Mother   . Heart failure Mother   . Cancer Mother   . Heart failure Father   . Cancer Father     BP 124/82   Ht 5\' 9"  (1.753 m)   Wt 185 lb (83.9 kg)   BMI 27.32 kg/m   Review of Systems: See HPI above.     Objective:  Physical Exam:  Gen: NAD, comfortable in exam room  Back: No deformity, swelling, erythema at low back Mild tenderness to palpation of lumbar spine around L4-L5 FROM with 5/5 strength bilateral lower extremities. Negative SLR Slightly positive reverse SLR NVI distaly  Bilateral hips: No deformity. FROM with 5/5 strength all motions. No tenderness to palpation. NVI distally. Negative fadir, faber  Assessment & Plan:  1. Acute low back pain  Likely 2/2 bulging lumbar disk after sudden movement. No red flags at this point and physical exam is reassuring. Lumbar X-ray that performed at ED today independently reviewed and looks unremarkable.  Plan will be pain control, starting prednisone (that was prescribed at ED today) and evaluate his  response.  May consider PT when pain improves.  -Instructed to pick up Hydrocodone, flexeril, and Prednisone prescriptions and take them as instructed -F/u in clinic in 1 month or sooner as needed (asked the patient to contact clinic in a week and let us know how he is doing) -If no improvement, will consider MRI

## 2018-12-10 ENCOUNTER — Encounter: Payer: Self-pay | Admitting: Family Medicine

## 2019-01-02 ENCOUNTER — Other Ambulatory Visit: Payer: Self-pay

## 2019-01-02 ENCOUNTER — Ambulatory Visit (INDEPENDENT_AMBULATORY_CARE_PROVIDER_SITE_OTHER): Payer: BC Managed Care – PPO | Admitting: Pharmacist

## 2019-01-02 DIAGNOSIS — Z23 Encounter for immunization: Secondary | ICD-10-CM | POA: Diagnosis not present

## 2019-01-02 DIAGNOSIS — Z7252 High risk homosexual behavior: Secondary | ICD-10-CM | POA: Diagnosis not present

## 2019-01-02 NOTE — Progress Notes (Signed)
Date:  01/02/2019   HPI: Curtis Trevino is a 53 y.o. male who presents to the [facility] for 3 month PrEP follow-up.  Insured   []    Uninsured  []    Patient Active Problem List   Diagnosis Date Noted  . Cholecystitis with cholelithiasis 11/18/2013    Patient's Medications  New Prescriptions   No medications on file  Previous Medications   ALBUTEROL (VENTOLIN HFA) 108 (90 BASE) MCG/ACT INHALER    Inhale 2 puffs into the lungs every 4 (four) hours as needed for wheezing or shortness of breath.   CYCLOBENZAPRINE (FLEXERIL) 5 MG TABLET    Take 1-2 tablets (5-10 mg total) by mouth 2 (two) times daily as needed for muscle spasms.   DESCOVY 200-25 MG TABLET    Take 1 tablet by mouth once daily   HYDROCODONE-ACETAMINOPHEN (NORCO/VICODIN) 5-325 MG TABLET    Take 1 tablet by mouth every 6 (six) hours as needed for severe pain.   IBUPROFEN (ADVIL,MOTRIN) 200 MG TABLET    Take 400-600 mg by mouth every 8 (eight) hours as needed (for migraine headaches.).   VITAMIN C (ASCORBIC ACID) 500 MG TABLET    Take 1,000 mg by mouth daily.  Modified Medications   No medications on file  Discontinued Medications   No medications on file    Allergies: Allergies  Allergen Reactions  . Tylenol [Acetaminophen] Nausea And Vomiting    Vomiting every time he takes tylenol  . Codeine Hives, Itching and Rash    Past Medical History: Past Medical History:  Diagnosis Date  . Acid reflux   . Chronic back pain     Social History: Social History   Socioeconomic History  . Marital status: Single    Spouse name: Not on file  . Number of children: Not on file  . Years of education: Not on file  . Highest education level: Not on file  Occupational History  . Not on file  Social Needs  . Financial resource strain: Not on file  . Food insecurity    Worry: Not on file    Inability: Not on file  . Transportation needs    Medical: Not on file    Non-medical: Not on file  Tobacco Use  .  Smoking status: Former Smoker    Quit date: 11/15/2010    Years since quitting: 8.1  . Smokeless tobacco: Never Used  Substance and Sexual Activity  . Alcohol use: Yes    Alcohol/week: 3.0 standard drinks    Types: 3 Shots of liquor per week    Comment: 3 drink a day  . Drug use: No  . Sexual activity: Not on file  Lifestyle  . Physical activity    Days per week: Not on file    Minutes per session: Not on file  . Stress: Not on file  Relationships  . Social Herbalist on phone: Not on file    Gets together: Not on file    Attends religious service: Not on file    Active member of club or organization: Not on file    Attends meetings of clubs or organizations: Not on file    Relationship status: Not on file  Other Topics Concern  . Not on file  Social History Narrative  . Not on file    [facility] HIV PREP FLOWSHEET RESULTS 01/02/2019 06/13/2018 03/18/2018 12/17/2017 09/18/2017 08/23/2017  Insurance Status Insured Insured Insured Insured - Insured  Gender at  birth Male Male Male Male Male Male  Gender identity cis-Male cis-Male cis-Male cis-Male cis-Male cis-Male  Risk for HIV Hx of STI;Condomless vaginal or anal intercourse Hx of STI;Condomless vaginal or anal intercourse >5 partners in past 6 mos (regardless of condom use);Hx of STI;Condomless vaginal or anal intercourse In sexual relationship with HIV+ partner;Condomless vaginal or anal intercourse;>5 partners in past 6 mos (regardless of condom use);Hx of STI >5 partners in past 6 mos (regardless of condom use) Condomless vaginal or anal intercourse;>5 partners in past 6 mos (regardless of condom use)  Sex Partners Men only Men only Men only Men only Men only Men only  # sex partners past 3-6 mos 1-3 1-3 1-3 1-3 1-3 10-12  Sex activity preferences Insertive and receptive;Oral Insertive and receptive;Oral Insertive and receptive;Oral Insertive and receptive;Oral Receptive;Insertive;Oral Insertive and receptive;Oral  Condom use No No  No No Yes Yes  % condom use - - - - 71 65  Partners genders and ages - - M 25-29 M 20-24 M 30-49;M 25-29 M 50+;M 30-49  Treated for STI? No No No No No No  HIV symptoms? N/A N/A N/A N/A - N/A  PrEP Eligibility Substantial risk for HIV Substantial risk for HIV Substantial risk for HIV;Past treatment for potential exposure to HIV from sex or injectable drugs - HIV negative;Substantial risk for HIV Substantial risk for HIV    Labs:  SCr: Lab Results  Component Value Date   CREATININE 1.25 06/13/2018   CREATININE 1.33 03/18/2018   CREATININE 1.50 (H) 12/17/2017   CREATININE 1.24 08/23/2017   CREATININE 1.24 05/16/2017   HIV Lab Results  Component Value Date   HIV NON-REACTIVE 06/13/2018   HIV NON-REACTIVE 03/18/2018   HIV NON-REACTIVE 12/17/2017   HIV NON-REACTIVE 09/18/2017   HIV NON-REACTIVE 08/23/2017   Hepatitis B Lab Results  Component Value Date   HEPBSAB NON-REACTIVE 08/23/2017   HEPBSAG NON-REACTIVE 08/23/2017   Hepatitis C Lab Results  Component Value Date   HEPCAB NON-REACTIVE 08/23/2017   Hepatitis A Lab Results  Component Value Date   HAV NON-REACTIVE 08/23/2017   RPR and STI Lab Results  Component Value Date   LABRPR REACTIVE (A) 06/13/2018   LABRPR REACTIVE (A) 03/18/2018   LABRPR REACTIVE (A) 09/18/2017   LABRPR REACTIVE (A) 08/23/2017   RPRTITER 1:8 (H) 06/13/2018   RPRTITER 1:8 (H) 03/18/2018   RPRTITER 1:32 (H) 09/18/2017   RPRTITER 1:32 (H) 08/23/2017    STI Results GC CT  06/13/2018 Negative Negative  06/13/2018 Negative Negative  06/13/2018 Negative Negative  03/18/2018 Negative Negative  03/18/2018 Negative Negative  03/18/2018 **POSITIVE**(A) **POSITIVE**(A)  09/18/2017 Negative Negative  09/18/2017 Negative Negative  09/18/2017 Negative Negative  08/23/2017 Negative Negative  08/23/2017 Negative Negative  08/23/2017 **POSITIVE**(A) Negative    Assessment: Curtis Trevino is here today for PrEP follow up.  I haven't seen him since March before the  [place] pandemic really started.  He actually tested positive for COVID back in June and was really ill from the virus for about 2 weeks.  He had trouble breathing, headaches that radiated through his back, and aches and pains.  He almost had to go to the hospital but luckily started feeling better. He tested negative about a month after initial diagnosis.  During this time and right after his illness, his mother got sick and ended up in hospice and passed away.  He is also stating that he may lose him job at [employer] for which he has had for 16 years.  He  has had a hard time lately.  He is only sexually active with his partner, Curtis Trevino who I also see for PrEP.  They haven't been very active lately.  He is in need of a refill of Descovy. He prefers to defer STD screening today as he has not been very active, and I think that is fine. Will check a BMET and HIV antibody and see him back in 3 months.  I gave him his flu shot today.  Plan: - HIV antibody and BMET today - Descovy x 3 months if HIV negative - F/u with me again 12/16 at 915am  Gila Lauf L. Suede Greenawalt, PharmD, BCIDP, AAHIVP, [facility] 01/02/2019, 11:45 AM

## 2019-01-03 ENCOUNTER — Other Ambulatory Visit: Payer: Self-pay | Admitting: Pharmacist

## 2019-01-03 DIAGNOSIS — Z7252 High risk homosexual behavior: Secondary | ICD-10-CM

## 2019-01-03 LAB — BASIC METABOLIC PANEL
BUN: 20 mg/dL (ref 7–25)
CO2: 23 mmol/L (ref 20–32)
Calcium: 9.3 mg/dL (ref 8.6–10.3)
Chloride: 109 mmol/L (ref 98–110)
Creat: 1.19 mg/dL (ref 0.70–1.33)
Glucose, Bld: 122 mg/dL — ABNORMAL HIGH (ref 65–99)
Potassium: 4 mmol/L (ref 3.5–5.3)
Sodium: 140 mmol/L (ref 135–146)

## 2019-01-03 LAB — HIV ANTIBODY (ROUTINE TESTING W REFLEX): HIV 1&2 Ab, 4th Generation: NONREACTIVE

## 2019-01-03 MED ORDER — DESCOVY 200-25 MG PO TABS: 1.0000 | ORAL_TABLET | Freq: Every day | ORAL | 2 refills | Status: DC

## 2019-01-03 NOTE — Progress Notes (Signed)
Patient's HIV antibody is negative.  Will send in 3 more months of Descovy to [facility].

## 2019-01-13 ENCOUNTER — Telehealth: Payer: Self-pay | Admitting: Pharmacist

## 2019-01-13 DIAGNOSIS — Z202 Contact with and (suspected) exposure to infections with a predominantly sexual mode of transmission: Secondary | ICD-10-CM

## 2019-01-13 MED ORDER — DOXYCYCLINE HYCLATE 100 MG PO TABS: 100.0000 mg | ORAL_TABLET | Freq: Two times a day (BID) | ORAL | 0 refills | Status: DC

## 2019-01-13 NOTE — Telephone Encounter (Signed)
I see patient for PrEP and his partner recently tested positive for rectal chlamydia. Will preemptively treat patient as well with doxycycline 100 mg PO BID x 7 days. Sent to [employer]. Patient knows to get other partners tested/treated and to abstain from sexual activity for 10 days.

## 2019-01-13 NOTE — Telephone Encounter (Signed)
Thanks Cassie 

## 2019-03-18 ENCOUNTER — Other Ambulatory Visit: Payer: Self-pay | Admitting: Pharmacist

## 2019-03-18 DIAGNOSIS — Z7252 High risk homosexual behavior: Secondary | ICD-10-CM

## 2019-03-19 ENCOUNTER — Telehealth: Payer: Self-pay | Admitting: Pharmacy Technician

## 2019-03-19 ENCOUNTER — Ambulatory Visit (INDEPENDENT_AMBULATORY_CARE_PROVIDER_SITE_OTHER): Payer: BC Managed Care – PPO | Admitting: Pharmacist

## 2019-03-19 ENCOUNTER — Other Ambulatory Visit: Payer: Self-pay

## 2019-03-19 DIAGNOSIS — Z7252 High risk homosexual behavior: Secondary | ICD-10-CM

## 2019-03-19 LAB — HIV ANTIBODY (ROUTINE TESTING W REFLEX): HIV 1&2 Ab, 4th Generation: NONREACTIVE

## 2019-03-19 NOTE — Telephone Encounter (Signed)
[facility] Patient Advocate Encounter  Patient had no complaints or questions prior to his lab appointment. He said he was sick and missed months of medication and most recently had missed approximately 2 weeks worth of medication.   He wants his next electronic script sent to [facility] in [place]. [place], [place] [address] [phone] pharmacy #  His insurance requires him to fill at one of their pharmacy locations. I informed him that Cassie would be calling once labs return. He had nothing further to report.

## 2019-03-21 ENCOUNTER — Other Ambulatory Visit: Payer: Self-pay | Admitting: Pharmacist

## 2019-03-21 DIAGNOSIS — Z7252 High risk homosexual behavior: Secondary | ICD-10-CM

## 2019-03-21 MED ORDER — DESCOVY 200-25 MG PO TABS: 1.0000 | ORAL_TABLET | Freq: Every day | ORAL | 0 refills | Status: DC

## 2019-03-21 NOTE — Progress Notes (Signed)
Patient's HIV antibody is negative.  Will refill Descovy and send it to [facility] in [place].

## 2019-03-21 NOTE — Progress Notes (Signed)
Date:  03/21/2019   HPI: Curtis Trevino is a 53 y.o. male who presents to the [facility] for 3 month PrEP follow-up.  Insured   [x]    Uninsured  []    Patient Active Problem List   Diagnosis Date Noted  . Cholecystitis with cholelithiasis 11/18/2013    Patient's Medications  New Prescriptions   No medications on file  Previous Medications   ALBUTEROL (VENTOLIN HFA) 108 (90 BASE) MCG/ACT INHALER    Inhale 2 puffs into the lungs every 4 (four) hours as needed for wheezing or shortness of breath.   CYCLOBENZAPRINE (FLEXERIL) 5 MG TABLET    Take 1-2 tablets (5-10 mg total) by mouth 2 (two) times daily as needed for muscle spasms.   DOXYCYCLINE (VIBRA-TABS) 100 MG TABLET    Take 1 tablet (100 mg total) by mouth 2 (two) times daily.   HYDROCODONE-ACETAMINOPHEN (NORCO/VICODIN) 5-325 MG TABLET    Take 1 tablet by mouth every 6 (six) hours as needed for severe pain.   IBUPROFEN (ADVIL,MOTRIN) 200 MG TABLET    Take 400-600 mg by mouth every 8 (eight) hours as needed (for migraine headaches.).   VITAMIN C (ASCORBIC ACID) 500 MG TABLET    Take 1,000 mg by mouth daily.  Modified Medications   Modified Medication Previous Medication   EMTRICITABINE-TENOFOVIR AF (DESCOVY) 200-25 MG TABLET emtricitabine-tenofovir AF (DESCOVY) 200-25 MG tablet      Take 1 tablet by mouth daily.    Take 1 tablet by mouth daily.  Discontinued Medications   No medications on file    Allergies: Allergies  Allergen Reactions  . Tylenol [Acetaminophen] Nausea And Vomiting    Vomiting every time he takes tylenol  . Codeine Hives, Itching and Rash    Past Medical History: Past Medical History:  Diagnosis Date  . Acid reflux   . Chronic back pain     Social History: Social History   Socioeconomic History  . Marital status: Single    Spouse name: Not on file  . Number of children: Not on file  . Years of education: Not on file  . Highest education level: Not on file  Occupational History  .  Not on file  Tobacco Use  . Smoking status: Former Smoker    Quit date: 11/15/2010    Years since quitting: 8.3  . Smokeless tobacco: Never Used  Substance and Sexual Activity  . Alcohol use: Yes    Alcohol/week: 3.0 standard drinks    Types: 3 Shots of liquor per week    Comment: 3 drink a day  . Drug use: No  . Sexual activity: Not on file  Other Topics Concern  . Not on file  Social History Narrative  . Not on file   Social Determinants of Health   Financial Resource Strain:   . Difficulty of Paying Living Expenses: Not on file  Food Insecurity:   . Worried About Charity fundraiser in the Last Year: Not on file  . Ran Out of Food in the Last Year: Not on file  Transportation Needs:   . Lack of Transportation (Medical): Not on file  . Lack of Transportation (Non-Medical): Not on file  Physical Activity:   . Days of Exercise per Week: Not on file  . Minutes of Exercise per Session: Not on file  Stress:   . Feeling of Stress : Not on file  Social Connections:   . Frequency of Communication with Friends and Family: Not on  file  . Frequency of Social Gatherings with Friends and Family: Not on file  . Attends Religious Services: Not on file  . Active Member of Clubs or Organizations: Not on file  . Attends Archivist Meetings: Not on file  . Marital Status: Not on file    [facility] HIV PREP FLOWSHEET RESULTS 01/02/2019 06/13/2018 03/18/2018 12/17/2017 09/18/2017 08/23/2017  Insurance Status Insured Insured Insured Insured - Insured  Gender at birth Male Male Male Male Male Male  Gender identity cis-Male cis-Male cis-Male cis-Male cis-Male cis-Male  Risk for HIV Hx of STI;Condomless vaginal or anal intercourse Hx of STI;Condomless vaginal or anal intercourse >5 partners in past 6 mos (regardless of condom use);Hx of STI;Condomless vaginal or anal intercourse In sexual relationship with HIV+ partner;Condomless vaginal or anal intercourse;>5 partners in past 6 mos (regardless of  condom use);Hx of STI >5 partners in past 6 mos (regardless of condom use) Condomless vaginal or anal intercourse;>5 partners in past 6 mos (regardless of condom use)  Sex Partners Men only Men only Men only Men only Men only Men only  # sex partners past 3-6 mos 1-3 1-3 1-3 1-3 1-3 10-12  Sex activity preferences Insertive and receptive;Oral Insertive and receptive;Oral Insertive and receptive;Oral Insertive and receptive;Oral Receptive;Insertive;Oral Insertive and receptive;Oral  Condom use No No No No Yes Yes  % condom use - - - - 54 65  Partners genders and ages - - M 25-29 M 20-24 M 30-49;M 25-29 M 50+;M 30-49  Treated for STI? No No No No No No  HIV symptoms? N/A N/A N/A N/A - N/A  PrEP Eligibility Substantial risk for HIV Substantial risk for HIV Substantial risk for HIV;Past treatment for potential exposure to HIV from sex or injectable drugs - HIV negative;Substantial risk for HIV Substantial risk for HIV    Labs:  SCr: Lab Results  Component Value Date   CREATININE 1.19 01/02/2019   CREATININE 1.25 06/13/2018   CREATININE 1.33 03/18/2018   CREATININE 1.50 (H) 12/17/2017   CREATININE 1.24 08/23/2017   HIV Lab Results  Component Value Date   HIV NON-REACTIVE 03/19/2019   HIV NON-REACTIVE 01/02/2019   HIV NON-REACTIVE 06/13/2018   HIV NON-REACTIVE 03/18/2018   HIV NON-REACTIVE 12/17/2017   Hepatitis B Lab Results  Component Value Date   HEPBSAB NON-REACTIVE 08/23/2017   HEPBSAG NON-REACTIVE 08/23/2017   Hepatitis C Lab Results  Component Value Date   HEPCAB NON-REACTIVE 08/23/2017   Hepatitis A Lab Results  Component Value Date   HAV NON-REACTIVE 08/23/2017   RPR and STI Lab Results  Component Value Date   LABRPR REACTIVE (A) 06/13/2018   LABRPR REACTIVE (A) 03/18/2018   LABRPR REACTIVE (A) 09/18/2017   LABRPR REACTIVE (A) 08/23/2017   RPRTITER 1:8 (H) 06/13/2018   RPRTITER 1:8 (H) 03/18/2018   RPRTITER 1:32 (H) 09/18/2017   RPRTITER 1:32 (H)  08/23/2017    STI Results GC CT  06/13/2018 Negative Negative  06/13/2018 Negative Negative  06/13/2018 Negative Negative  03/18/2018 Negative Negative  03/18/2018 Negative Negative  03/18/2018 **POSITIVE**(A) **POSITIVE**(A)  09/18/2017 Negative Negative  09/18/2017 Negative Negative  09/18/2017 Negative Negative  08/23/2017 Negative Negative  08/23/2017 Negative Negative  08/23/2017 **POSITIVE**(A) Negative    Assessment: Axeton came in today to get labs drawn for PrEP.  He continues on Descovy with no issues or concerns.  He does tell me that he received a letter in the mail from his insurance that will require him to use generic Truvada starting January 1st.  I offered to appeal this but he states that he was ok with switching back. No concerns for anything today.  His HIV test was negative, so I will send in a 30 day supply of Descovy to [facility] so that he can fill that this month.  I will set a reminder to myself to send in Truvada in a few weeks and he will continue on that going forward.  Answered all questions.  Will see him back in 3 months.  Plan: - HIV negative - Descovy x 30 days for December - Send in Truvada Rx in January - F/u with me again 3/10 at [facility]. Nyah Shepherd, PharmD, BCIDP, AAHIVP, [facility] for [facility] 03/21/2019, 10:57 AM

## 2019-03-24 ENCOUNTER — Other Ambulatory Visit: Payer: Self-pay

## 2019-03-24 ENCOUNTER — Ambulatory Visit
Admission: RE | Admit: 2019-03-24 | Discharge: 2019-03-24 | Disposition: A | Discharge status: Home / Self Care | Payer: BC Managed Care – PPO | Source: Ambulatory Visit | Attending: Family Medicine | Admitting: Family Medicine

## 2019-03-24 ENCOUNTER — Ambulatory Visit (INDEPENDENT_AMBULATORY_CARE_PROVIDER_SITE_OTHER): Payer: BC Managed Care – PPO | Admitting: Family Medicine

## 2019-03-24 VITALS — BP 108/82 | Ht 69.0 in | Wt 190.0 lb

## 2019-03-24 DIAGNOSIS — M2550 Pain in unspecified joint: Secondary | ICD-10-CM

## 2019-03-24 DIAGNOSIS — M25562 Pain in left knee: Secondary | ICD-10-CM | POA: Diagnosis not present

## 2019-03-24 DIAGNOSIS — M25561 Pain in right knee: Secondary | ICD-10-CM

## 2019-03-24 DIAGNOSIS — M79674 Pain in right toe(s): Secondary | ICD-10-CM | POA: Diagnosis not present

## 2019-03-24 DIAGNOSIS — G8929 Other chronic pain: Secondary | ICD-10-CM

## 2019-03-24 DIAGNOSIS — M79641 Pain in right hand: Secondary | ICD-10-CM

## 2019-03-24 DIAGNOSIS — M79642 Pain in left hand: Secondary | ICD-10-CM

## 2019-03-24 NOTE — Assessment & Plan Note (Signed)
Chronic joint pain that patient does say mostly in fingers 3 through 5 which is a carpal distribution, patient specifically denies nerve pain and his pain is only in joint.  Bilateral x-ray with indication of osteoarthritic changes, no indication of inflammatory condition.  Physical exam with no sign of infection and mild bony prominence at MIP/PIP with no angulation. Nsaids/tylenol as tolerated w/ patient on HIV antiviral

## 2019-03-24 NOTE — Assessment & Plan Note (Signed)
Long-term chronic,.  Said there are often times has increased swelling, does not report any instances of infection.  Physical exam showed decreased dorsiflexion and bony prominence consistent with osteoarthritis.  Recommend alternating Tylenol and NSAIDs as allowed by kidney function given patient on HIV antiviral

## 2019-03-24 NOTE — Progress Notes (Signed)
    Subjective:  Curtis Trevino is a 53 y.o. male who presents to the [facility] today with a chief complaint of polyarthritic pain.   HPI: Pain of great toe, right Long-term chronic.  Said there are often times he has increased swelling, does not report any instances of infection or warmth.  No trauma reported.  No history of gout.  No fever, sweats, chills.  Bilateral hand pain Chronic joint pain that patient does say mostly in fingers 3 through 5, patient specifically denies numbness and tingling.  Says he has been having some loss of strength due to pain.  Chronic pain of both knees Chronic pain anteriorly.  Hurts all day, worse with prolonged standing  Objective:  Physical Exam: BP 108/82   Ht 5\' 9"  (1.753 m)   Wt 190 lb (86.2 kg)   BMI 28.06 kg/m   Gen: NAD, pleasant Pulm: NWOB MSK:   Bilateral hands: [place] nodes largest of left 2nd digit.  No malrotation/angulation or deformity at MCP joints.  No effusion, warmth of digits or wrists. FROM with 5/5 strength. No tenderness to palpation currently NVI distally.  Right foot: Dorsal spurring palpated 1st MTP joint.  Hallux rigidus.  No other deformity. Limited dorsiflexion 1st MTP.   Mild tenderness to palpation 1st MTP. NVI distally.  Bilateral knees: No gross deformity, ecchymoses, warmth, effusion. No TTP. FROM with 5/5 strength. Negative ant/post drawers. Negative valgus/varus testing. Negative lachmans. Negative mcmurrays, apleys, patellar apprehension. NV intact distally.   Assessment/Plan:  Pain of great toe, right Long-term chronic,.  Said there are often times has increased swelling, does not report any instances of infection.  Physical exam showed decreased dorsiflexion and bony prominence consistent with osteoarthritis.  Recommend alternating Tylenol and NSAIDs as allowed by kidney function given patient on HIV antiviral  Bilateral hand pain Chronic joint pain that patient does say mostly in fingers 3  through 5 which is a carpal distribution, patient specifically denies nerve pain and his pain is only in joint.  Bilateral x-ray with indication of osteoarthritic changes, no indication of inflammatory condition.  Physical exam with no sign of infection and mild bony prominence at MIP/PIP with no angulation. Nsaids/tylenol as tolerated w/ patient on HIV antiviral  Chronic pain of both knees Chronic pain consistent with osteoarthritis complaints.  No laxity on exam and no obvious crepitus  XR with joint spaces preserved.  Can use tylenol/nsaids as tolerated by kidney function given HIV antiviral.  Could consider trial of steroid injections in future w/ PT  Sherene Sires, DO FAMILY MEDICINE RESIDENT - PGY3 03/24/2019 3:56 PM

## 2019-03-24 NOTE — Patient Instructions (Signed)
Get x-rays of your hands and knees after you leave today. These are important and will dictate next steps depending on how these look and the arthritis pattern. These are the different medications you can take for this: Tylenol 500mg  1-2 tabs three times a day for pain. Capsaicin, aspercreme, or biofreeze topically up to four times a day may also help with pain. Some supplements that may help for arthritis: Boswellia extract, curcumin, pycnogenol - I would start with boswellia extract which you can get from [place] or meloxicam but try to minimize this with your other medication (can try voltaren gel instead up to 4 times a day). Cortisone injections are an option for specific joints. It's important that you continue to stay active. Consider physical therapy to strengthen muscles around the joint that hurts to take pressure off of the joint itself. Wear sports insoles with 1st ray post to take pressure off the arthritis of your great toe. Avoid flat shoes, barefoot walking as much as you can. Heat or ice 15 minutes at a time 3-4 times a day as needed to help with pain. Water aerobics and cycling with low resistance are the best two types of exercise for arthritis though any exercise is ok as long as it doesn't worsen the pain. Follow up with me in 1 month but we will talk before then.

## 2019-03-24 NOTE — Assessment & Plan Note (Signed)
Chronic pain consitsten with osteoarhtritis complaints.  No laxity on exam and no obvious crepitus  XR with joint spaces preserved.  Can use tylenol/nsaids as tolerated by kidney function given HIV antiviral.  Could consider trial of steroid injection in future w/ PT

## 2019-03-25 ENCOUNTER — Encounter: Payer: Self-pay | Admitting: Family Medicine

## 2019-03-25 NOTE — Addendum Note (Signed)
Addended by: Jolinda Croak E on: 03/25/2019 04:58 PM   Modules accepted: Orders

## 2019-03-26 ENCOUNTER — Ambulatory Visit: Payer: BC Managed Care – PPO | Admitting: Pharmacist

## 2019-04-20 ENCOUNTER — Other Ambulatory Visit: Payer: Self-pay | Admitting: Pharmacist

## 2019-04-20 DIAGNOSIS — Z7252 High risk homosexual behavior: Secondary | ICD-10-CM

## 2019-04-20 DIAGNOSIS — Z79899 Other long term (current) drug therapy: Secondary | ICD-10-CM

## 2019-04-20 MED ORDER — EMTRICITABINE-TENOFOVIR DF 200-300 MG PO TABS: 1.0000 | ORAL_TABLET | Freq: Every day | ORAL | 1 refills | Status: DC

## 2019-04-23 ENCOUNTER — Ambulatory Visit (INDEPENDENT_AMBULATORY_CARE_PROVIDER_SITE_OTHER): Payer: BC Managed Care – PPO | Admitting: Family Medicine

## 2019-04-23 ENCOUNTER — Other Ambulatory Visit: Payer: Self-pay

## 2019-04-23 ENCOUNTER — Encounter: Payer: Self-pay | Admitting: Family Medicine

## 2019-04-23 VITALS — BP 128/64 | HR 87 | Ht 69.0 in | Wt 180.0 lb

## 2019-04-23 DIAGNOSIS — R739 Hyperglycemia, unspecified: Secondary | ICD-10-CM | POA: Insufficient documentation

## 2019-04-23 DIAGNOSIS — R7303 Prediabetes: Secondary | ICD-10-CM | POA: Diagnosis not present

## 2019-04-23 DIAGNOSIS — M2550 Pain in unspecified joint: Secondary | ICD-10-CM | POA: Insufficient documentation

## 2019-04-23 LAB — POCT GLYCOSYLATED HEMOGLOBIN (HGB A1C): Hemoglobin A1C: 5.8 % — AB (ref 4.0–5.6)

## 2019-04-23 MED ORDER — DICLOFENAC SODIUM 1 % EX GEL: 4.0000 g | Freq: Four times a day (QID) | CUTANEOUS | 4 refills | Status: DC

## 2019-04-23 NOTE — Progress Notes (Signed)
Subjective:  Curtis Trevino is a 54 y.o. male who presents to the [facility] today with a chief complaint of multiple joint pain (primarily hands) and establishing care.   HPI: Patient here to establish care, medical history most significant for HIV positive but following with ID and compliant on medication recently changed from Descovy to Truvada for insurance reasons.  Also with chronic complaint of migratory polyarthritis.  This was discussed with sports medicine last month and they ordered some follow-up labs but he did not know where to get them drawn so he will get them drawn today.  He will come back for urine.  No change in status he is otherwise well.  He would like to get A1c drawn because he has a family history of diabetes and has had some minor elevated glucoses in the past   Objective:  Physical Exam: BP 128/64   Pulse 87   Ht '5\' 9"'  (1.753 m)   Wt 180 lb (81.6 kg)   SpO2 98%   BMI 26.58 kg/m   Gen: NAD, thin, conversing comfortably CV: RRR with no murmurs appreciated Pulm: NWOB, CTAB with no crackles, wheezes, or rhonchi GI: Normal bowel sounds present. Soft, Nontender, Nondistended. MSK: no edema, cyanosis, or clubbing noted, no obvious bony abnormalities to physical exam Skin: warm, dry Neuro: grossly normal, moves all extremities Psych: Normal affect and thought content  X-ray review shows mild osteoarthritis in bilateral hands, negative knee exam, negative lumbar x-ray  Results for orders placed or performed in visit on 04/23/19 (from the past 72 hour(s))  HgB A1c     Status: Abnormal   Collection Time: 04/23/19  9:50 AM  Result Value Ref Range   Hemoglobin A1C 5.8 (A) 4.0 - 5.6 %   HbA1c POC (<> result, manual entry)     HbA1c, POC (prediabetic range)     HbA1c, POC (controlled diabetic range)    CBC w/Diff     Status: None   Collection Time: 04/23/19  9:52 AM  Result Value Ref Range   WBC 8.1 3.4 - 10.8 x10E3/uL   RBC 4.72 4.14 - 5.80 x10E6/uL   Hemoglobin  14.0 13.0 - 17.7 g/dL   Hematocrit 40.4 37.5 - 51.0 %   MCV 86 79 - 97 fL   MCH 29.7 26.6 - 33.0 pg   MCHC 34.7 31.5 - 35.7 g/dL   RDW 13.4 11.6 - 15.4 %   Platelets 262 150 - 450 x10E3/uL   Neutrophils 60 Not Estab. %   Lymphs 29 Not Estab. %   Monocytes 8 Not Estab. %   Eos 2 Not Estab. %   Basos 1 Not Estab. %   Neutrophils Absolute 4.8 1.4 - 7.0 x10E3/uL   Lymphocytes Absolute 2.4 0.7 - 3.1 x10E3/uL   Monocytes Absolute 0.7 0.1 - 0.9 x10E3/uL   EOS (ABSOLUTE) 0.2 0.0 - 0.4 x10E3/uL   Basophils Absolute 0.1 0.0 - 0.2 x10E3/uL   Immature Granulocytes 0 Not Estab. %   Immature Grans (Abs) 0.0 0.0 - 0.1 x10E3/uL  C-reactive protein     Status: None   Collection Time: 04/23/19  9:52 AM  Result Value Ref Range   CRP 1 0 - 10 mg/L  Rheumatoid Factor     Status: None   Collection Time: 04/23/19  9:52 AM  Result Value Ref Range   Rhuematoid fact SerPl-aCnc <10.0 0.0 - 13.9 IU/mL  Antinuclear Antib (ANA)     Status: None   Collection Time: 04/23/19  9:52 AM  Result Value Ref Range   Anti Nuclear Antibody (ANA) Negative Negative  Sedimentation rate     Status: None   Collection Time: 04/23/19  9:52 AM  Result Value Ref Range   Sed Rate 17 0 - 30 mm/hr  HLA-B27 Antigen     Status: None (Preliminary result)   Collection Time: 04/23/19  9:52 AM  Result Value Ref Range   HLA B27 WILL FOLLOW      Assessment/Plan:  Polyarthralgia Patient with complaint of multiple joint pain migrating and now largely focused in bilateral hands.  Autoimmune lab work-up has been ordered by sports medicine this patient was unsure where to go get the labs drawn, those are being drawn today.  We will schedule telemedicine follow-up to discuss results with patient.  Joint pain is stable and he does not appear to have any septic indications at this point  Prediabetes Prediabetes with A1c at 5.8, patient is set to schedule a telemedicine visit after he gets his urine drawn or we can discuss results.  Plan  to offer Metformin if he wants it.   Sherene Sires, DO FAMILY MEDICINE RESIDENT - PGY3 04/25/2019 10:42 AM

## 2019-04-23 NOTE — Assessment & Plan Note (Signed)
Patient with complaint of multiple joint pain migrating and now largely focused in bilateral hands.  Autoimmune lab work-up has been ordered by sports medicine this patient was unsure where to go get the labs drawn, those are being drawn today.  We will schedule telemedicine follow-up to discuss results with patient.  Joint pain is stable and he does not appear to have any septic indications at this point

## 2019-04-25 ENCOUNTER — Telehealth: Payer: Self-pay | Admitting: Pharmacy Technician

## 2019-04-25 NOTE — Assessment & Plan Note (Signed)
Prediabetes with A1c at 5.8, patient is set to schedule a telemedicine visit after he gets his urine drawn or we can discuss results.  Plan to offer Metformin if he wants it.

## 2019-04-25 NOTE — Telephone Encounter (Addendum)
error 

## 2019-04-29 LAB — CBC WITH DIFFERENTIAL/PLATELET
Basophils Absolute: 0.1 10*3/uL (ref 0.0–0.2)
Basos: 1 %
EOS (ABSOLUTE): 0.2 10*3/uL (ref 0.0–0.4)
Eos: 2 %
Hematocrit: 40.4 % (ref 37.5–51.0)
Hemoglobin: 14 g/dL (ref 13.0–17.7)
Immature Grans (Abs): 0 10*3/uL (ref 0.0–0.1)
Immature Granulocytes: 0 %
Lymphocytes Absolute: 2.4 10*3/uL (ref 0.7–3.1)
Lymphs: 29 %
MCH: 29.7 pg (ref 26.6–33.0)
MCHC: 34.7 g/dL (ref 31.5–35.7)
MCV: 86 fL (ref 79–97)
Monocytes Absolute: 0.7 10*3/uL (ref 0.1–0.9)
Monocytes: 8 %
Neutrophils Absolute: 4.8 10*3/uL (ref 1.4–7.0)
Neutrophils: 60 %
Platelets: 262 10*3/uL (ref 150–450)
RBC: 4.72 x10E6/uL (ref 4.14–5.80)
RDW: 13.4 % (ref 11.6–15.4)
WBC: 8.1 10*3/uL (ref 3.4–10.8)

## 2019-04-29 LAB — HLA-B27 ANTIGEN: HLA B27: NEGATIVE

## 2019-04-29 LAB — ANA: Anti Nuclear Antibody (ANA): NEGATIVE

## 2019-04-29 LAB — RHEUMATOID FACTOR: Rheumatoid fact SerPl-aCnc: <10 IU/mL (ref 0.0–13.9)

## 2019-04-29 LAB — SEDIMENTATION RATE: Sed Rate: 17 mm/hr (ref 0–30)

## 2019-04-29 LAB — C-REACTIVE PROTEIN: CRP: 1 mg/L (ref 0–10)

## 2019-04-30 ENCOUNTER — Ambulatory Visit (INDEPENDENT_AMBULATORY_CARE_PROVIDER_SITE_OTHER): Payer: BC Managed Care – PPO | Admitting: Family Medicine

## 2019-04-30 ENCOUNTER — Encounter: Payer: Self-pay | Admitting: Family Medicine

## 2019-04-30 ENCOUNTER — Other Ambulatory Visit: Payer: Self-pay

## 2019-04-30 VITALS — BP 104/78 | Ht 69.0 in | Wt 170.0 lb

## 2019-04-30 DIAGNOSIS — M79642 Pain in left hand: Secondary | ICD-10-CM

## 2019-04-30 DIAGNOSIS — M79674 Pain in right toe(s): Secondary | ICD-10-CM

## 2019-04-30 DIAGNOSIS — M25562 Pain in left knee: Secondary | ICD-10-CM

## 2019-04-30 DIAGNOSIS — M2550 Pain in unspecified joint: Secondary | ICD-10-CM

## 2019-04-30 DIAGNOSIS — M25561 Pain in right knee: Secondary | ICD-10-CM

## 2019-04-30 DIAGNOSIS — M79641 Pain in right hand: Secondary | ICD-10-CM

## 2019-04-30 DIAGNOSIS — G8929 Other chronic pain: Secondary | ICD-10-CM

## 2019-04-30 NOTE — Progress Notes (Signed)
Patient came back in today to see how he's doing and go over lab results.  No abnormalities on hand and knee radiographs or labwork to suggest a rheumatologic process.  Consistent with osteoarthritis.  He's using voltaren gel without much benefit.  Sports insoles with 1st ray post have helped.  Ibuprofen helps but he knows this is limited given interaction with other medication.  He's going to try boswellia though discussed other supplements as well.  Continue being active.  Instructions provided.  Can consider rheum referral but unlikely they'd have much to offer him given the above.

## 2019-04-30 NOTE — Patient Instructions (Signed)
These are the different medications you can take for this: Tylenol 500mg  1-2 tabs three times a day for pain. Capsaicin, aspercreme, or biofreeze topically up to four times a day may also help with pain. Some supplements that may help for arthritis: Boswellia extract, curcumin, pycnogenol - I would start with boswellia extract which you can get from [facility] Ibuprofen or meloxicam but try to minimize this with your other medication. Cortisone injections are an option for specific joints. It's important that you continue to stay active. Consider physical therapy to strengthen muscles around the joints that hurt to take pressure off of the joint itself. Wear sports insoles with 1st ray post to take pressure off the arthritis of your great toe. Avoid flat shoes, barefoot walking as much as you can. Heat or ice 15 minutes at a time 3-4 times a day as needed to help with pain. Water aerobics and cycling with low resistance are the best two types of exercise for arthritis though any exercise is ok as long as it doesn't worsen the pain. Consider rheumatology referral but it's unlikely given x-rays and labwork there would be anything additional they could offer. Follow up with me in 6 weeks or as needed.

## 2019-06-18 ENCOUNTER — Ambulatory Visit (INDEPENDENT_AMBULATORY_CARE_PROVIDER_SITE_OTHER): Payer: BC Managed Care – PPO | Admitting: Pharmacist

## 2019-06-18 ENCOUNTER — Other Ambulatory Visit (HOSPITAL_COMMUNITY)
Admission: RE | Admit: 2019-06-18 | Discharge: 2019-06-18 | Disposition: A | Discharge status: Home / Self Care | Payer: BC Managed Care – PPO | Source: Ambulatory Visit | Attending: Infectious Disease | Admitting: Infectious Disease

## 2019-06-18 ENCOUNTER — Other Ambulatory Visit: Payer: Self-pay

## 2019-06-18 DIAGNOSIS — Z79899 Other long term (current) drug therapy: Secondary | ICD-10-CM | POA: Diagnosis not present

## 2019-06-18 DIAGNOSIS — Z113 Encounter for screening for infections with a predominantly sexual mode of transmission: Secondary | ICD-10-CM

## 2019-06-18 DIAGNOSIS — Z7252 High risk homosexual behavior: Secondary | ICD-10-CM

## 2019-06-18 NOTE — Progress Notes (Signed)
Date:  06/18/2019   HPI: Curtis Trevino is a 53 y.o. male who presents to the [facility] for 3 month PrEP follow-up.  Insured   [x]    Uninsured  []    Patient Active Problem List   Diagnosis Date Noted  . Polyarthralgia 04/23/2019  . Hyperglycemia 04/23/2019  . Prediabetes 04/23/2019  . Chronic pain of both knees 03/24/2019  . Bilateral hand pain 03/24/2019  . Pain of great toe, right 03/24/2019  . Cholecystitis with cholelithiasis 11/18/2013    Patient's Medications  New Prescriptions   No medications on file  Previous Medications   ALBUTEROL (VENTOLIN HFA) 108 (90 BASE) MCG/ACT INHALER    Inhale 2 puffs into the lungs every 4 (four) hours as needed for wheezing or shortness of breath.   DICLOFENAC SODIUM (VOLTAREN) 1 % GEL    Apply 4 g topically 4 (four) times daily.   EMTRICITABINE-TENOFOVIR (TRUVADA) 200-300 MG TABLET    Take 1 tablet by mouth daily.   IBUPROFEN (ADVIL,MOTRIN) 200 MG TABLET    Take 400-600 mg by mouth every 8 (eight) hours as needed (for migraine headaches.).   VITAMIN C (ASCORBIC ACID) 500 MG TABLET    Take 1,000 mg by mouth daily.  Modified Medications   No medications on file  Discontinued Medications   No medications on file    Allergies: Allergies  Allergen Reactions  . Curtis Trevino [Acetaminophen] Nausea And Vomiting    Vomiting every time he takes Curtis Trevino  . Codeine Hives, Itching and Rash    Past Medical History: Past Medical History:  Diagnosis Date  . Acid reflux   . Chronic back pain     Social History: Social History   Socioeconomic History  . Marital status: Single    Spouse name: Not on file  . Number of children: Not on file  . Years of education: Not on file  . Highest education level: Not on file  Occupational History  . Not on file  Tobacco Use  . Smoking status: Former Smoker    Quit date: 11/15/2010    Years since quitting: 8.5  . Smokeless tobacco: Never Used  Substance and Sexual Activity  . Alcohol  use: Yes    Alcohol/week: 3.0 standard drinks    Types: 3 Shots of liquor per week    Comment: 3 drink a day  . Drug use: No  . Sexual activity: Not on file  Other Topics Concern  . Not on file  Social History Narrative  . Not on file   Social Determinants of Health   Financial Resource Strain:   . Difficulty of Paying Living Expenses: Not on file  Food Insecurity:   . Worried About Charity fundraiser in the Last Year: Not on file  . Ran Out of Food in the Last Year: Not on file  Transportation Needs:   . Lack of Transportation (Medical): Not on file  . Lack of Transportation (Non-Medical): Not on file  Physical Activity:   . Days of Exercise per Week: Not on file  . Minutes of Exercise per Session: Not on file  Stress:   . Feeling of Stress : Not on file  Social Connections:   . Frequency of Communication with Friends and Family: Not on file  . Frequency of Social Gatherings with Friends and Family: Not on file  . Attends Religious Services: Not on file  . Active Member of Clubs or Organizations: Not on file  . Attends Club  or Organization Meetings: Not on file  . Marital Status: Not on file    [facility] HIV PREP FLOWSHEET RESULTS 01/02/2019 06/13/2018 03/18/2018 12/17/2017 09/18/2017 08/23/2017  Insurance Status Insured Insured Insured Insured - Insured  Gender at birth Male Male Male Male Male Male  Gender identity cis-Male cis-Male cis-Male cis-Male cis-Male cis-Male  Risk for HIV Hx of STI;Condomless vaginal or anal intercourse Hx of STI;Condomless vaginal or anal intercourse >5 partners in past 6 mos (regardless of condom use);Hx of STI;Condomless vaginal or anal intercourse In sexual relationship with HIV+ partner;Condomless vaginal or anal intercourse;>5 partners in past 6 mos (regardless of condom use);Hx of STI >5 partners in past 6 mos (regardless of condom use) Condomless vaginal or anal intercourse;>5 partners in past 6 mos (regardless of condom use)  Sex Partners Men only Men  only Men only Men only Men only Men only  # sex partners past 3-6 mos 1-3 1-3 1-3 1-3 1-3 10-12  Sex activity preferences Insertive and receptive;Oral Insertive and receptive;Oral Insertive and receptive;Oral Insertive and receptive;Oral Receptive;Insertive;Oral Insertive and receptive;Oral  Condom use No No No No Yes Yes  % condom use - - - - 64 65  Partners genders and ages - - M 25-29 M 20-24 M 30-49;M 25-29 M 50+;M 30-49  Treated for STI? No No No No No No  HIV symptoms? N/A N/A N/A N/A - N/A  PrEP Eligibility Substantial risk for HIV Substantial risk for HIV Substantial risk for HIV;Past treatment for potential exposure to HIV from sex or injectable drugs - HIV negative;Substantial risk for HIV Substantial risk for HIV    Labs:  SCr: Lab Results  Component Value Date   CREATININE 1.19 01/02/2019   CREATININE 1.25 06/13/2018   CREATININE 1.33 03/18/2018   CREATININE 1.50 (H) 12/17/2017   CREATININE 1.24 08/23/2017   HIV Lab Results  Component Value Date   HIV NON-REACTIVE 03/19/2019   HIV NON-REACTIVE 01/02/2019   HIV NON-REACTIVE 06/13/2018   HIV NON-REACTIVE 03/18/2018   HIV NON-REACTIVE 12/17/2017   Hepatitis B Lab Results  Component Value Date   HEPBSAB NON-REACTIVE 08/23/2017   HEPBSAG NON-REACTIVE 08/23/2017   Hepatitis C Lab Results  Component Value Date   HEPCAB NON-REACTIVE 08/23/2017   Hepatitis A Lab Results  Component Value Date   HAV NON-REACTIVE 08/23/2017   RPR and STI Lab Results  Component Value Date   LABRPR REACTIVE (A) 06/13/2018   LABRPR REACTIVE (A) 03/18/2018   LABRPR REACTIVE (A) 09/18/2017   LABRPR REACTIVE (A) 08/23/2017   RPRTITER 1:8 (H) 06/13/2018   RPRTITER 1:8 (H) 03/18/2018   RPRTITER 1:32 (H) 09/18/2017   RPRTITER 1:32 (H) 08/23/2017    STI Results GC CT  06/13/2018 Negative Negative  06/13/2018 Negative Negative  06/13/2018 Negative Negative  03/18/2018 Negative Negative  03/18/2018 Negative Negative  03/18/2018  **POSITIVE**(A) **POSITIVE**(A)  09/18/2017 Negative Negative  09/18/2017 Negative Negative  09/18/2017 Negative Negative  08/23/2017 Negative Negative  08/23/2017 Negative Negative  08/23/2017 **POSITIVE**(A) Negative    Assessment: TB is here today for his Truvada PrEP f/u. Overall he is doing well, but he did experience some transient GI issues for the first few weeks on Truvada which are no longer an issue. He had no questions and no PrEP related issues.   His main focus today was getting his COVID vaccine scheduled.  Plan: 1) Labs: HIV Ab, BMP, STI check (oral/rectal swabs, RPR, urine cytology) 2) 90 days Truvada if HIV negative 3) F/u 3 months 4)1st COVID shot on  the 06/23/19 at the [place] 06/18/2019, 10:16 AM

## 2019-06-18 NOTE — Patient Instructions (Addendum)
[email or web address]  [email or web address]  [phone]

## 2019-06-19 ENCOUNTER — Other Ambulatory Visit: Payer: Self-pay

## 2019-06-19 ENCOUNTER — Ambulatory Visit (INDEPENDENT_AMBULATORY_CARE_PROVIDER_SITE_OTHER): Payer: BC Managed Care – PPO

## 2019-06-19 ENCOUNTER — Other Ambulatory Visit: Payer: Self-pay | Admitting: Pharmacist

## 2019-06-19 ENCOUNTER — Telehealth: Payer: Self-pay | Admitting: Pharmacist

## 2019-06-19 DIAGNOSIS — A549 Gonococcal infection, unspecified: Secondary | ICD-10-CM | POA: Diagnosis not present

## 2019-06-19 DIAGNOSIS — Z7252 High risk homosexual behavior: Secondary | ICD-10-CM

## 2019-06-19 DIAGNOSIS — Z79899 Other long term (current) drug therapy: Secondary | ICD-10-CM

## 2019-06-19 LAB — URINE CYTOLOGY ANCILLARY ONLY
Chlamydia: NEGATIVE
Comment: NEGATIVE
Comment: NORMAL
Neisseria Gonorrhea: NEGATIVE

## 2019-06-19 LAB — CYTOLOGY, (ORAL, ANAL, URETHRAL) ANCILLARY ONLY
Chlamydia: NEGATIVE
Chlamydia: NEGATIVE
Comment: NEGATIVE
Comment: NEGATIVE
Comment: NORMAL
Comment: NORMAL
Neisseria Gonorrhea: POSITIVE — AB
Neisseria Gonorrhea: POSITIVE — AB

## 2019-06-19 LAB — BASIC METABOLIC PANEL
BUN: 24 mg/dL (ref 7–25)
CO2: 26 mmol/L (ref 20–32)
Calcium: 10 mg/dL (ref 8.6–10.3)
Chloride: 105 mmol/L (ref 98–110)
Creat: 1.27 mg/dL (ref 0.70–1.33)
Glucose, Bld: 125 mg/dL — ABNORMAL HIGH (ref 65–99)
Potassium: 4.2 mmol/L (ref 3.5–5.3)
Sodium: 139 mmol/L (ref 135–146)

## 2019-06-19 LAB — HIV ANTIBODY (ROUTINE TESTING W REFLEX): HIV 1&2 Ab, 4th Generation: NONREACTIVE

## 2019-06-19 LAB — RPR TITER: RPR Titer: 1:4 {titer} — ABNORMAL HIGH

## 2019-06-19 LAB — FLUORESCENT TREPONEMAL AB(FTA)-IGG-BLD: Fluorescent Treponemal ABS: REACTIVE — AB

## 2019-06-19 LAB — RPR: RPR Ser Ql: REACTIVE — AB

## 2019-06-19 MED ORDER — EMTRICITABINE-TENOFOVIR DF 200-300 MG PO TABS: 1.0000 | ORAL_TABLET | Freq: Every day | ORAL | 2 refills | Status: DC

## 2019-06-19 MED ORDER — CEFTRIAXONE SODIUM 500 MG IJ SOLR
500.0000 mg | Freq: Once | INTRAMUSCULAR | Status: AC
  Administered 2019-06-19: 500 mg via INTRAMUSCULAR

## 2019-06-19 NOTE — Telephone Encounter (Signed)
Called patient to let him know that his HIV test was negative.  Will send in 90 days of Truvada to [place].  His pharyngeal and rectal swabs came back positive for gonorrhea.  Discussed with patient. He will come this afternoon for treatment, ok per Romie Jumper, RN.   He will need ceftriaxone 500 mg IM x 1. No allergies to PCN or cephalosporins. Patient will need to abstain from any sexual activity for 7-10 days and tells his partners to get tested/treated as well.

## 2019-09-04 ENCOUNTER — Other Ambulatory Visit: Payer: Self-pay

## 2019-09-04 ENCOUNTER — Emergency Department (HOSPITAL_COMMUNITY): Payer: BC Managed Care – PPO

## 2019-09-04 ENCOUNTER — Encounter (HOSPITAL_COMMUNITY): Payer: Self-pay | Admitting: Emergency Medicine

## 2019-09-04 ENCOUNTER — Emergency Department (HOSPITAL_COMMUNITY)
Admission: EM | Admit: 2019-09-04 | Discharge: 2019-09-04 | Disposition: A | Discharge status: Home / Self Care | Payer: BC Managed Care – PPO | Attending: Emergency Medicine | Admitting: Emergency Medicine

## 2019-09-04 DIAGNOSIS — W231XXA Caught, crushed, jammed, or pinched between stationary objects, initial encounter: Secondary | ICD-10-CM | POA: Insufficient documentation

## 2019-09-04 DIAGNOSIS — Y999 Unspecified external cause status: Secondary | ICD-10-CM | POA: Insufficient documentation

## 2019-09-04 DIAGNOSIS — Y92099 Unspecified place in other non-institutional residence as the place of occurrence of the external cause: Secondary | ICD-10-CM | POA: Insufficient documentation

## 2019-09-04 DIAGNOSIS — Y9389 Activity, other specified: Secondary | ICD-10-CM | POA: Insufficient documentation

## 2019-09-04 DIAGNOSIS — S92514A Nondisplaced fracture of proximal phalanx of right lesser toe(s), initial encounter for closed fracture: Secondary | ICD-10-CM | POA: Insufficient documentation

## 2019-09-04 DIAGNOSIS — S99921A Unspecified injury of right foot, initial encounter: Secondary | ICD-10-CM | POA: Diagnosis present

## 2019-09-04 NOTE — ED Triage Notes (Signed)
Patient arrives to ED with complaints of injury to right pinky toe after hitting the side of some furniture.

## 2019-09-04 NOTE — Progress Notes (Signed)
Orthopedic Tech Progress Note Patient Details:  KERRINGTON OESTERLE [birth year] IA:[id number]  Ortho Devices Type of Ortho Device: Buddy tape, Postop shoe/boot Ortho Device/Splint Location: Right Foot 4th-5th toes Ortho Device/Splint Interventions: Application   Post Interventions Patient Tolerated: Well Instructions Provided: Care of device, Adjustment of device, Poper ambulation with device   Kathi Dohn E Dotti Busey 09/04/2019, 10:39 PM

## 2019-09-04 NOTE — Discharge Instructions (Addendum)
Ice and elevate foot at home.  Tylenol or motrin for pain. Can follow-up with our on call orthopedist, Dr. Stann Mainland if any ongoing issues.  Otherwise, can see your primary care doctor. Return here for any new/acute changes.

## 2019-09-04 NOTE — ED Provider Notes (Signed)
[facility] EMERGENCY DEPARTMENT Provider Note   CSN: [id number] Arrival date & time: 09/04/19  2004     History Chief Complaint  Patient presents with  . Toe Injury    EMRAAN SWOR is a 54 y.o. male.  The history is provided by the patient and medical records.    54 y.o. M here with right little toe injury.  States he was walking through the house and stubbed his toe on a wooden table his boys were playing with trains on.  He states he has been able to walk but with pain.  Toe is generally throbbing at this point.  No numbness.  No intervention tried PTA.  Past Medical History:  Diagnosis Date  . Acid reflux   . Chronic back pain     Patient Active Problem List   Diagnosis Date Noted  . Polyarthralgia 04/23/2019  . Hyperglycemia 04/23/2019  . Prediabetes 04/23/2019  . Chronic pain of both knees 03/24/2019  . Bilateral hand pain 03/24/2019  . Pain of great toe, right 03/24/2019  . Cholecystitis with cholelithiasis 11/18/2013    Past Surgical History:  Procedure Laterality Date  . CHOLECYSTECTOMY N/A 11/18/2013   Procedure: LAPAROSCOPIC CHOLECYSTECTOMY;  Surgeon: Scherry Ran, MD;  Location: [facility];  Service: General;  Laterality: N/A;  . ESOPHAGOGASTRODUODENOSCOPY  2005  . SCROTAL EXPLORATION Left 06/23/2016   Procedure: LEFT SCROTAL EXPLORATION WITH EXCISION OF EPIDIDYMAL MASS;  Surgeon: Irine Seal, MD;  Location: [facility];  Service: Urology;  Laterality: Left;       Family History  Problem Relation Age of Onset  . Diabetes Mother   . Heart failure Mother   . Cancer Mother   . Heart failure Father   . Cancer Father     Social History   Tobacco Use  . Smoking status: Former Smoker    Quit date: 11/15/2010    Years since quitting: 8.8  . Smokeless tobacco: Never Used  Substance Use Topics  . Alcohol use: Yes    Alcohol/week: 3.0 standard drinks    Types: 3 Shots of liquor per week    Comment: 3 drink a day  . Drug use: No     Home Medications Prior to Admission medications   Medication Sig Start Date End Date Taking? Authorizing Provider  albuterol (VENTOLIN HFA) 108 (90 Base) MCG/ACT inhaler Inhale 2 puffs into the lungs every 4 (four) hours as needed for wheezing or shortness of breath. Patient not taking: Reported on 12/09/2018 09/06/18   Benjamine Mola, FNP  diclofenac Sodium (VOLTAREN) 1 % GEL Apply 4 g topically 4 (four) times daily. 04/23/19   Sherene Sires, DO  emtricitabine-tenofovir (TRUVADA) 200-300 MG tablet Take 1 tablet by mouth daily. 06/19/19   Kuppelweiser, Cassie L, RPH-CPP  ibuprofen (ADVIL,MOTRIN) 200 MG tablet Take 400-600 mg by mouth every 8 (eight) hours as needed (for migraine headaches.).    [provider]  vitamin C (ASCORBIC ACID) 500 MG tablet Take 1,000 mg by mouth daily.    [provider]  cetirizine (ZYRTEC) 10 MG tablet Take 1 tablet (10 mg total) by mouth daily. 08/08/18 12/09/18  Loura Halt A, NP  fluticasone (FLONASE) 50 MCG/ACT nasal spray Place 1 spray into both nostrils daily. 08/08/18 12/09/18  Loura Halt A, NP  omeprazole (PRILOSEC OTC) 20 MG tablet Take 20 mg by mouth at bedtime.   12/09/18  [provider]    Allergies    Tylenol [acetaminophen] and Codeine  Review  of Systems   Review of Systems  Musculoskeletal: Positive for arthralgias. Negative for gait problem and joint swelling.  Skin: Negative for wound.  Neurological: Negative for numbness.  All other systems reviewed and are negative.   Physical Exam Updated Vital Signs BP 117/90   Pulse 88   Temp 98.7 F (37.1 C)   Resp 17   Ht 5\' 9"  (1.753 m)   Wt 86.2 kg   SpO2 98%   BMI 28.06 kg/m   Physical Exam Vitals and nursing note reviewed.  Constitutional:      Appearance: He is well-developed.  HENT:     Head: Normocephalic and atraumatic.  Eyes:     Conjunctiva/sclera: Conjunctivae normal.     Pupils: Pupils are equal, round, and reactive to light.  Cardiovascular:      Rate and Rhythm: Normal rate and regular rhythm.     Heart sounds: Normal heart sounds.  Pulmonary:     Effort: Pulmonary effort is normal.     Breath sounds: Normal breath sounds.  Abdominal:     General: Bowel sounds are normal.     Palpations: Abdomen is soft.  Musculoskeletal:        General: Normal range of motion.     Cervical back: Normal range of motion.     Comments: Right little toe is a little bruised and swollen, no gross deformity noted, able to flex/extend but with pain, normal distal sensation and perfusion, normal cap refill  Skin:    General: Skin is warm and dry.  Neurological:     Mental Status: He is alert and oriented to person, place, and time.     ED Results / Procedures / Treatments   Labs (all labs ordered are listed, but only abnormal results are displayed) Labs Reviewed - No data to display  EKG None  Radiology DG Toe 5th Right  Result Date: 09/04/2019 CLINICAL DATA:  Fall EXAM: RIGHT FIFTH TOE COMPARISON:  None. FINDINGS: Oblique fracture of the midshaft right fifth proximal phalanx. IMPRESSION: Oblique fracture of the right fifth proximal phalanx. Electronically Signed   By: Ulyses Jarred M.D.   On: 09/04/2019 21:14    Procedures Procedures (including critical care time)  Medications Ordered in ED Medications - No data to display  ED Course  I have reviewed the triage vital signs and the nursing notes.  Pertinent labs & imaging results that were available during my care of the patient were reviewed by me and considered in my medical decision making (see chart for details).    MDM Rules/Calculators/A&P  54 y.o. M here with right 5th toe injury after stubbing it on a wooden table leg.  No gross deformity on exam, right foot and toe are NVI.  X-ray with oblique fracture of proximal phalanx.  Will buddy tape toes and apply post-op shoe.  Will be given orthopedic follow-up if any ongoing issues, otherwise can see PCP.  Return here for any  new/acute changes.  Final Clinical Impression(s) / ED Diagnoses Final diagnoses:  Closed nondisplaced fracture of proximal phalanx of lesser toe of right foot, initial encounter    Rx / DC Orders ED Discharge Orders    None       Larene Pickett, PA-C 09/04/19 [place], [place], DO 09/04/19 2313

## 2019-09-05 ENCOUNTER — Telehealth: Payer: Self-pay

## 2019-09-05 NOTE — Telephone Encounter (Signed)
COVID-19 Pre-Screening Questions:09/05/19  Do you currently have a fever (>100 F), chills or unexplained body aches? NO  Are you currently experiencing new cough, shortness of breath, sore throat, runny nose?NO .  . Have you recently travelled outside the state of  in the last 14 days?NO    Have you been in contact with someone that is currently pending confirmation of Covid19 testing or has been confirmed to have the Covid19 virus? NO  **If the patient answers NO to ALL questions -  advise the patient to please call the clinic before coming to the office should any symptoms develop.    

## 2019-09-09 ENCOUNTER — Other Ambulatory Visit: Payer: Self-pay

## 2019-09-09 ENCOUNTER — Ambulatory Visit (INDEPENDENT_AMBULATORY_CARE_PROVIDER_SITE_OTHER): Payer: BC Managed Care – PPO | Admitting: Pharmacist

## 2019-09-09 DIAGNOSIS — Z79899 Other long term (current) drug therapy: Secondary | ICD-10-CM | POA: Diagnosis not present

## 2019-09-09 DIAGNOSIS — Z7252 High risk homosexual behavior: Secondary | ICD-10-CM | POA: Diagnosis not present

## 2019-09-09 NOTE — Progress Notes (Addendum)
I have reviewed the Infectious Disease Clinical Pharmacist's note above. I agree with the assessment and plan as outlined in Raymar Joiner's note.   Date:  09/09/2019   HPI: Curtis Trevino is a 54 y.o. male who presents to the [place] clinic for 3 month PrEP follow-up.  Insured   [x]    Uninsured  []    Patient Active Problem List   Diagnosis Date Noted  . Polyarthralgia 04/23/2019  . Hyperglycemia 04/23/2019  . Prediabetes 04/23/2019  . Chronic pain of both knees 03/24/2019  . Bilateral hand pain 03/24/2019  . Pain of great toe, right 03/24/2019  . Cholecystitis with cholelithiasis 11/18/2013    Patient's Medications  New Prescriptions   No medications on file  Previous Medications   ALBUTEROL (VENTOLIN HFA) 108 (90 BASE) MCG/ACT INHALER    Inhale 2 puffs into the lungs every 4 (four) hours as needed for wheezing or shortness of breath.   DICLOFENAC SODIUM (VOLTAREN) 1 % GEL    Apply 4 g topically 4 (four) times daily.   EMTRICITABINE-TENOFOVIR (TRUVADA) 200-300 MG TABLET    Take 1 tablet by mouth daily.   IBUPROFEN (ADVIL,MOTRIN) 200 MG TABLET    Take 400-600 mg by mouth every 8 (eight) hours as needed (for migraine headaches.).   VITAMIN C (ASCORBIC ACID) 500 MG TABLET    Take 1,000 mg by mouth daily.  Modified Medications   No medications on file  Discontinued Medications   No medications on file    Allergies: Allergies  Allergen Reactions  . Tylenol [Acetaminophen] Nausea And Vomiting    Vomiting every time he takes tylenol  . Codeine Hives, Itching and Rash    Past Medical History: Past Medical History:  Diagnosis Date  . Acid reflux   . Chronic back pain     Social History: Social History   Socioeconomic History  . Marital status: Single    Spouse name: Not on file  . Number of children: Not on file  . Years of education: Not on file  . Highest education level: Not on file  Occupational History  . Not on file  Tobacco Use  .  Smoking status: Former Smoker    Quit date: 11/15/2010    Years since quitting: 8.8  . Smokeless tobacco: Never Used  Substance and Sexual Activity  . Alcohol use: Yes    Alcohol/week: 3.0 standard drinks    Types: 3 Shots of liquor per week    Comment: 3 drink a day  . Drug use: No  . Sexual activity: Not on file  Other Topics Concern  . Not on file  Social History Narrative  . Not on file   Social Determinants of Health   Financial Resource Strain:   . Difficulty of Paying Living Expenses:   Food Insecurity:   . Worried About Charity fundraiser in the Last Year:   . Arboriculturist in the Last Year:   Transportation Needs:   . Film/video editor (Medical):   Marland Kitchen Lack of Transportation (Non-Medical):   Physical Activity:   . Days of Exercise per Week:   . Minutes of Exercise per Session:   Stress:   . Feeling of Stress :   Social Connections:   . Frequency of Communication with Friends and Family:   . Frequency of Social Gatherings with Friends and Family:   . Attends Religious Services:   . Active Member of Clubs or Organizations:   .  Attends Archivist Meetings:   Marland Kitchen Marital Status:     CHL HIV PREP FLOWSHEET RESULTS 01/02/2019 06/13/2018 03/18/2018 12/17/2017 09/18/2017 08/23/2017  Insurance Status Insured Insured Insured Insured - Insured  Gender at birth Male Male Male Male Male Male  Gender identity cis-Male cis-Male cis-Male cis-Male cis-Male cis-Male  Risk for HIV Hx of STI;Condomless vaginal or anal intercourse Hx of STI;Condomless vaginal or anal intercourse >5 partners in past 6 mos (regardless of condom use);Hx of STI;Condomless vaginal or anal intercourse In sexual relationship with HIV+ partner;Condomless vaginal or anal intercourse;>5 partners in past 6 mos (regardless of condom use);Hx of STI >5 partners in past 6 mos (regardless of condom use) Condomless vaginal or anal intercourse;>5 partners in past 6 mos (regardless of condom use)  Sex Partners Men  only Men only Men only Men only Men only Men only  # sex partners past 3-6 mos 1-3 1-3 1-3 1-3 1-3 10-12  Sex activity preferences Insertive and receptive;Oral Insertive and receptive;Oral Insertive and receptive;Oral Insertive and receptive;Oral Receptive;Insertive;Oral Insertive and receptive;Oral  Condom use No No No No Yes Yes  % condom use - - - - 86 65  Partners genders and ages - - M 25-29 M 20-24 M 30-49;M 25-29 M 50+;M 30-49  Treated for STI? No No No No No No  HIV symptoms? N/A N/A N/A N/A - N/A  PrEP Eligibility Substantial risk for HIV Substantial risk for HIV Substantial risk for HIV;Past treatment for potential exposure to HIV from sex or injectable drugs - HIV negative;Substantial risk for HIV Substantial risk for HIV    Labs:  SCr: Lab Results  Component Value Date   CREATININE 1.27 06/18/2019   CREATININE 1.19 01/02/2019   CREATININE 1.25 06/13/2018   CREATININE 1.33 03/18/2018   CREATININE 1.50 (H) 12/17/2017   HIV Lab Results  Component Value Date   HIV NON-REACTIVE 06/18/2019   HIV NON-REACTIVE 03/19/2019   HIV NON-REACTIVE 01/02/2019   HIV NON-REACTIVE 06/13/2018   HIV NON-REACTIVE 03/18/2018   Hepatitis B Lab Results  Component Value Date   HEPBSAB NON-REACTIVE 08/23/2017   HEPBSAG NON-REACTIVE 08/23/2017   Hepatitis C Lab Results  Component Value Date   HEPCAB NON-REACTIVE 08/23/2017   Hepatitis A Lab Results  Component Value Date   HAV NON-REACTIVE 08/23/2017   RPR and STI Lab Results  Component Value Date   LABRPR REACTIVE (A) 06/18/2019   LABRPR REACTIVE (A) 06/13/2018   LABRPR REACTIVE (A) 03/18/2018   LABRPR REACTIVE (A) 09/18/2017   LABRPR REACTIVE (A) 08/23/2017   RPRTITER 1:4 (H) 06/18/2019   RPRTITER 1:8 (H) 06/13/2018   RPRTITER 1:8 (H) 03/18/2018   RPRTITER 1:32 (H) 09/18/2017   RPRTITER 1:32 (H) 08/23/2017    STI Results GC CT  06/18/2019 Negative Negative  06/18/2019 Positive(A) Negative  06/18/2019 Positive(A) Negative   06/13/2018 Negative Negative  06/13/2018 Negative Negative  06/13/2018 Negative Negative  03/18/2018 Negative Negative  03/18/2018 Negative Negative  03/18/2018 **POSITIVE**(A) **POSITIVE**(A)  09/18/2017 Negative Negative  09/18/2017 Negative Negative  09/18/2017 Negative Negative  08/23/2017 Negative Negative  08/23/2017 Negative Negative  08/23/2017 **POSITIVE**(A) Negative    Assessment: Curtis Trevino is here for his 3 month PrEP follow up appointment. He is doing well except for a recent broken toe. He only has one partner and no issues today. He was positive for rectal gonorrhea at last visit and had treatment with ceftriaxone. He is on chronic doxycycline for his acne and has been on that for a few months. Will defer  STD testing today per his preference. Will check HIV and send in refills if negative.  Plan: - HIV antibody today - Descovy x 3 months if HIV negative - F/u in 3 months  Lancelot Alyea L. Glenora Morocho, PharmD, BCIDP, AAHIVP, CPP Clinical Pharmacist Practitioner Infectious Diseases [place] [facility] 09/09/2019, 4:47 PM

## 2019-09-10 ENCOUNTER — Encounter: Payer: Self-pay | Admitting: Pharmacist

## 2019-09-10 ENCOUNTER — Other Ambulatory Visit: Payer: Self-pay | Admitting: Pharmacist

## 2019-09-10 DIAGNOSIS — Z79899 Other long term (current) drug therapy: Secondary | ICD-10-CM

## 2019-09-10 DIAGNOSIS — Z7252 High risk homosexual behavior: Secondary | ICD-10-CM

## 2019-09-10 LAB — HIV ANTIBODY (ROUTINE TESTING W REFLEX): HIV 1&2 Ab, 4th Generation: NONREACTIVE

## 2019-09-10 MED ORDER — EMTRICITABINE-TENOFOVIR DF 200-300 MG PO TABS: 1.0000 | ORAL_TABLET | Freq: Every day | ORAL | 2 refills | Status: DC

## 2019-09-10 NOTE — Progress Notes (Signed)
Patient's HIV antibody is negative.  Will send in 3 more months of Descovy to [facility].

## 2019-10-27 ENCOUNTER — Encounter: Payer: Self-pay | Admitting: Pharmacist

## 2019-10-27 ENCOUNTER — Other Ambulatory Visit: Payer: Self-pay | Admitting: Pharmacist

## 2019-10-27 DIAGNOSIS — Z79899 Other long term (current) drug therapy: Secondary | ICD-10-CM

## 2019-10-27 NOTE — Progress Notes (Signed)
hiv

## 2019-11-15 ENCOUNTER — Emergency Department (HOSPITAL_COMMUNITY): Payer: BC Managed Care – PPO

## 2019-11-15 ENCOUNTER — Encounter (HOSPITAL_COMMUNITY): Payer: Self-pay

## 2019-11-15 ENCOUNTER — Other Ambulatory Visit: Payer: Self-pay

## 2019-11-15 ENCOUNTER — Emergency Department (HOSPITAL_COMMUNITY)
Admission: EM | Admit: 2019-11-15 | Discharge: 2019-11-15 | Disposition: A | Discharge status: Home / Self Care | Payer: BC Managed Care – PPO | Attending: Emergency Medicine | Admitting: Emergency Medicine

## 2019-11-15 DIAGNOSIS — Z20822 Contact with and (suspected) exposure to covid-19: Secondary | ICD-10-CM | POA: Diagnosis not present

## 2019-11-15 DIAGNOSIS — R0981 Nasal congestion: Secondary | ICD-10-CM | POA: Diagnosis present

## 2019-11-15 DIAGNOSIS — R05 Cough: Secondary | ICD-10-CM | POA: Insufficient documentation

## 2019-11-15 DIAGNOSIS — Z87891 Personal history of nicotine dependence: Secondary | ICD-10-CM | POA: Insufficient documentation

## 2019-11-15 DIAGNOSIS — Z79899 Other long term (current) drug therapy: Secondary | ICD-10-CM | POA: Diagnosis not present

## 2019-11-15 DIAGNOSIS — J069 Acute upper respiratory infection, unspecified: Secondary | ICD-10-CM | POA: Insufficient documentation

## 2019-11-15 LAB — CBC WITH DIFFERENTIAL/PLATELET
Abs Immature Granulocytes: 0.02 10*3/uL (ref 0.00–0.07)
Basophils Absolute: 0.1 10*3/uL (ref 0.0–0.1)
Basophils Relative: 1 %
Eosinophils Absolute: 0.1 10*3/uL (ref 0.0–0.5)
Eosinophils Relative: 1 %
HCT: 48.1 % (ref 39.0–52.0)
Hemoglobin: 15.2 g/dL (ref 13.0–17.0)
Immature Granulocytes: 0 %
Lymphocytes Relative: 21 %
Lymphs Abs: 1.5 10*3/uL (ref 0.7–4.0)
MCH: 28.8 pg (ref 26.0–34.0)
MCHC: 31.6 g/dL (ref 30.0–36.0)
MCV: 91.1 fL (ref 80.0–100.0)
Monocytes Absolute: 1 10*3/uL (ref 0.1–1.0)
Monocytes Relative: 15 %
Neutro Abs: 4.2 10*3/uL (ref 1.7–7.7)
Neutrophils Relative %: 62 %
Platelets: 218 10*3/uL (ref 150–400)
RBC: 5.28 MIL/uL (ref 4.22–5.81)
RDW: 14.3 % (ref 11.5–15.5)
WBC: 6.8 10*3/uL (ref 4.0–10.5)
nRBC: 0 % (ref 0.0–0.2)

## 2019-11-15 LAB — URINALYSIS, ROUTINE W REFLEX MICROSCOPIC
Bacteria, UA: NONE SEEN
Bilirubin Urine: NEGATIVE
Glucose, UA: NEGATIVE mg/dL
Ketones, ur: NEGATIVE mg/dL
Leukocytes,Ua: NEGATIVE
Nitrite: NEGATIVE
Protein, ur: NEGATIVE mg/dL
Specific Gravity, Urine: 1.02 (ref 1.005–1.030)
pH: 5 (ref 5.0–8.0)

## 2019-11-15 LAB — COMPREHENSIVE METABOLIC PANEL
ALT: 29 U/L (ref 0–44)
AST: 23 U/L (ref 15–41)
Albumin: 4.4 g/dL (ref 3.5–5.0)
Alkaline Phosphatase: 46 U/L (ref 38–126)
Anion gap: 9 (ref 5–15)
BUN: 21 mg/dL — ABNORMAL HIGH (ref 6–20)
CO2: 23 mmol/L (ref 22–32)
Calcium: 9.6 mg/dL (ref 8.9–10.3)
Chloride: 107 mmol/L (ref 98–111)
Creatinine, Ser: 1.21 mg/dL (ref 0.61–1.24)
GFR calc Af Amer: >60 mL/min (ref >60)
GFR calc non Af Amer: >60 mL/min (ref >60)
Glucose, Bld: 115 mg/dL — ABNORMAL HIGH (ref 70–99)
Potassium: 4.1 mmol/L (ref 3.5–5.1)
Sodium: 139 mmol/L (ref 135–145)
Total Bilirubin: 0.4 mg/dL (ref 0.3–1.2)
Total Protein: 8 g/dL (ref 6.5–8.1)

## 2019-11-15 LAB — SARS CORONAVIRUS 2 BY RT PCR (HOSPITAL ORDER, PERFORMED IN ~~LOC~~ HOSPITAL LAB): SARS Coronavirus 2: NEGATIVE

## 2019-11-15 LAB — GROUP A STREP BY PCR: Group A Strep by PCR: NOT DETECTED

## 2019-11-15 MED ORDER — SULFAMETHOXAZOLE-TRIMETHOPRIM 800-160 MG PO TABS: 1.0000 | ORAL_TABLET | Freq: Two times a day (BID) | ORAL | 0 refills | Status: AC

## 2019-11-15 MED ORDER — KETOROLAC TROMETHAMINE 30 MG/ML IJ SOLN
30.0000 mg | Freq: Once | INTRAMUSCULAR | Status: AC
  Administered 2019-11-15: 30 mg via INTRAVENOUS
  Filled 2019-11-15: qty 1

## 2019-11-15 MED ORDER — SODIUM CHLORIDE 0.9 % IV BOLUS
1000.0000 mL | Freq: Once | INTRAVENOUS | Status: AC
  Administered 2019-11-15: 1000 mL via INTRAVENOUS

## 2019-11-15 MED ORDER — ONDANSETRON HCL 4 MG/2ML IJ SOLN
4.0000 mg | Freq: Once | INTRAMUSCULAR | Status: AC
  Administered 2019-11-15: 4 mg via INTRAVENOUS
  Filled 2019-11-15: qty 2

## 2019-11-15 NOTE — Discharge Instructions (Addendum)
Follow-up with your doctors next week for recheck °

## 2019-11-15 NOTE — ED Triage Notes (Signed)
Pt is having coughing, vomiting, chills, lower back pain and a sore throat for the last 13 days. He had COVID back in June of 2020. Got Moderna vaccine in March. Son tested positive for strep.

## 2019-11-15 NOTE — ED Provider Notes (Signed)
[facility] EMERGENCY DEPARTMENT Provider Note   CSN: [id number] Arrival date & time: 11/15/19  3244     History Chief Complaint  Patient presents with  . Nasal Congestion    Curtis Trevino is a 54 y.o. male.  Patient complains of persistent cough for 3 weeks.  He has been on doxycycline without help  The history is provided by the patient. No language interpreter was used.  Cough Cough characteristics:  Non-productive Sputum characteristics:  Nondescript Severity:  Moderate Onset quality:  Gradual Timing:  Constant Progression:  Waxing and waning Chronicity:  New Smoker: no   Context: not animal exposure   Relieved by:  Nothing Worsened by:  Nothing Associated symptoms: no chest pain, no eye discharge, no headaches and no rash        Past Medical History:  Diagnosis Date  . Acid reflux   . Chronic back pain     Patient Active Problem List   Diagnosis Date Noted  . Polyarthralgia 04/23/2019  . Hyperglycemia 04/23/2019  . Prediabetes 04/23/2019  . Chronic pain of both knees 03/24/2019  . Bilateral hand pain 03/24/2019  . Pain of great toe, right 03/24/2019  . Cholecystitis with cholelithiasis 11/18/2013    Past Surgical History:  Procedure Laterality Date  . CHOLECYSTECTOMY N/A 11/18/2013   Procedure: LAPAROSCOPIC CHOLECYSTECTOMY;  Surgeon: Scherry Ran, MD;  Location: [facility];  Service: General;  Laterality: N/A;  . ESOPHAGOGASTRODUODENOSCOPY  2005  . SCROTAL EXPLORATION Left 06/23/2016   Procedure: LEFT SCROTAL EXPLORATION WITH EXCISION OF EPIDIDYMAL MASS;  Surgeon: Irine Seal, MD;  Location: [facility];  Service: Urology;  Laterality: Left;       Family History  Problem Relation Age of Onset  . Diabetes Mother   . Heart failure Mother   . Cancer Mother   . Heart failure Father   . Cancer Father     Social History   Tobacco Use  . Smoking status: Former Smoker    Quit date: 11/15/2010    Years since quitting: 9.0  . Smokeless tobacco:  Never Used  Substance Use Topics  . Alcohol use: Yes    Alcohol/week: 3.0 standard drinks    Types: 3 Shots of liquor per week    Comment: 3 drink a day  . Drug use: No    Home Medications Prior to Admission medications   Medication Sig Start Date End Date Taking? Authorizing Provider  doxycycline (VIBRA-TABS) 100 MG tablet Take 100 mg by mouth 2 (two) times daily. 11/02/19  Yes [provider]  emtricitabine-tenofovir (TRUVADA) 200-300 MG tablet Take 1 tablet by mouth daily. 09/10/19  Yes Kuppelweiser, Cassie L, RPH-CPP  ibuprofen (ADVIL,MOTRIN) 200 MG tablet Take 400-600 mg by mouth every 8 (eight) hours as needed (for migraine headaches.).   Yes [provider]  albuterol (VENTOLIN HFA) 108 (90 Base) MCG/ACT inhaler Inhale 2 puffs into the lungs every 4 (four) hours as needed for wheezing or shortness of breath. Patient not taking: Reported on 12/09/2018 09/06/18   Benjamine Mola, FNP  diclofenac Sodium (VOLTAREN) 1 % GEL Apply 4 g topically 4 (four) times daily. Patient not taking: Reported on 11/15/2019 04/23/19   Sherene Sires, DO  sulfamethoxazole-trimethoprim (BACTRIM DS) 800-160 MG tablet Take 1 tablet by mouth 2 (two) times daily for 7 days. 11/15/19 11/22/19  Milton Ferguson, MD  cetirizine (ZYRTEC) 10 MG tablet Take 1 tablet (10 mg total) by mouth daily. 08/08/18 12/09/18  Orvan July, NP  fluticasone (FLONASE) 50  MCG/ACT nasal spray Place 1 spray into both nostrils daily. 08/08/18 12/09/18  Loura Halt A, NP  omeprazole (PRILOSEC OTC) 20 MG tablet Take 20 mg by mouth at bedtime.   12/09/18  [provider]    Allergies    Tylenol [acetaminophen] and Codeine  Review of Systems   Review of Systems  Constitutional: Negative for appetite change and fatigue.  HENT: Negative for congestion, ear discharge and sinus pressure.   Eyes: Negative for discharge.  Respiratory: Positive for cough.   Cardiovascular: Negative for chest pain.  Gastrointestinal: Negative for  abdominal pain and diarrhea.  Genitourinary: Negative for frequency and hematuria.  Musculoskeletal: Negative for back pain.  Skin: Negative for rash.  Neurological: Negative for seizures and headaches.  Psychiatric/Behavioral: Negative for hallucinations.    Physical Exam Updated Vital Signs BP 127/86 (BP Location: Left Arm)   Pulse 85   Temp 99.2 F (37.3 C) (Oral)   Resp 12   Ht 5\' 9"  (1.753 m)   Wt 86.2 kg   SpO2 98%   BMI 28.06 kg/m   Physical Exam Vitals and nursing note reviewed.  Constitutional:      Appearance: He is well-developed.  HENT:     Head: Normocephalic.     Nose: Nose normal.  Eyes:     General: No scleral icterus.    Conjunctiva/sclera: Conjunctivae normal.  Neck:     Thyroid: No thyromegaly.  Cardiovascular:     Rate and Rhythm: Normal rate and regular rhythm.     Heart sounds: No murmur heard.  No friction rub. No gallop.   Pulmonary:     Breath sounds: No stridor. No wheezing or rales.  Chest:     Chest wall: No tenderness.  Abdominal:     General: There is no distension.     Tenderness: There is no abdominal tenderness. There is no rebound.  Musculoskeletal:        General: Normal range of motion.     Cervical back: Neck supple.  Lymphadenopathy:     Cervical: No cervical adenopathy.  Skin:    Findings: No erythema or rash.  Neurological:     Mental Status: He is oriented to person, place, and time.     Motor: No abnormal muscle tone.     Coordination: Coordination normal.  Psychiatric:        Behavior: Behavior normal.     ED Results / Procedures / Treatments   Labs (all labs ordered are listed, but only abnormal results are displayed) Labs Reviewed  COMPREHENSIVE METABOLIC PANEL - Abnormal; Notable for the following components:      Result Value   Glucose, Bld 115 (*)    BUN 21 (*)    All other components within normal limits  URINALYSIS, ROUTINE W REFLEX MICROSCOPIC - Abnormal; Notable for the following components:    Hgb urine dipstick SMALL (*)    All other components within normal limits  GROUP A STREP BY PCR  SARS CORONAVIRUS 2 BY RT PCR (HOSPITAL ORDER, [place] [facility])  CBC WITH DIFFERENTIAL/PLATELET    EKG None  Radiology DG ABD ACUTE 2+V W 1V CHEST  Result Date: 11/15/2019 CLINICAL DATA:  Abdominal pain with nausea and vomiting. EXAM: DG ABDOMEN ACUTE W/ 1V CHEST COMPARISON:  Chest radiograph 08/08/2018 FINDINGS: Slightly prominent lung markings are chronic. No focal airspace disease or lung consolidation. Heart and mediastinum are normal. Negative for free air. Evidence for cholecystectomy clips in the right upper abdomen.  Gas in the stomach. Nonobstructive bowel gas pattern. Moderate amount of stool in the abdomen. Mild curvature in the thoracolumbar spine. Mild widening of the left L4-L5 facet. IMPRESSION: 1. No acute abnormality. 2. Mild curvature in the thoracolumbar spine. Mild widening of the left L4-L5 facet. Electronically Signed   By: Markus Daft M.D.   On: 11/15/2019 09:14    Procedures Procedures (including critical care time)  Medications Ordered in ED Medications  sodium chloride 0.9 % bolus 1,000 mL (0 mLs Intravenous Stopped 11/15/19 0848)  ondansetron (ZOFRAN) injection 4 mg (4 mg Intravenous Given 11/15/19 0816)  ketorolac (TORADOL) 30 MG/ML injection 30 mg (30 mg Intravenous Given 11/15/19 0816)    ED Course  I have reviewed the triage vital signs and the nursing notes.  Pertinent labs & imaging results that were available during my care of the patient were reviewed by me and considered in my medical decision making (see chart for details).    MDM Rules/Calculators/A&P                          Labs unremarkable chest x-ray negative.  Patient with persistent cough.  Will be placed on Bactrim and follow-up with his doctors     This patient presents to the ED for concern of cough this involves an extensive number of treatment options, and is a complaint  that carries with it a high risk of complications and morbidity.  The differential diagnosis includes pneumonia bronchitis   Lab Tests:   I Ordered, reviewed, and interpreted labs, which included CBC chemistries which were unremarkable  Medicines ordered:   I ordered medication Bactrim for possible lung infection  Imaging Studies ordered:   I ordered imaging studies which included chest x-ray and  I independently visualized and interpreted imaging which showed unremarkable  Additional history obtained:   Additional history obtained from record  Previous records obtained and reviewed.  Consultations Obtained:     Reevaluation:  After the interventions stated above, I reevaluated the patient and found mild improvement  Critical Interventions:  .   Final Clinical Impression(s) / ED Diagnoses Final diagnoses:  Viral URI with cough    Rx / DC Orders ED Discharge Orders         Ordered    sulfamethoxazole-trimethoprim (BACTRIM DS) 800-160 MG tablet  2 times daily     Discontinue  Reprint     11/15/19 1124           Milton Ferguson, MD 11/15/19 1135

## 2019-11-28 ENCOUNTER — Ambulatory Visit: Payer: BC Managed Care – PPO | Admitting: Family Medicine

## 2019-11-28 ENCOUNTER — Other Ambulatory Visit: Payer: Self-pay

## 2019-11-28 DIAGNOSIS — H6982 Other specified disorders of Eustachian tube, left ear: Secondary | ICD-10-CM

## 2019-11-28 MED ORDER — FLUTICASONE PROPIONATE 50 MCG/ACT NA SUSP
2.0000 | Freq: Every day | NASAL | 0 refills | Status: DC
Start: 2019-11-28 — End: 2020-02-16

## 2019-11-28 NOTE — Patient Instructions (Signed)
Mr Curtis Trevino,   I think you have eustachian tube dysfunction which can happen after an upper respiratory tract infection. This is where fluid collects in the inner ear. I am giving you flonase which help with draining the fluid.  Please follow up with your PCP if your symptoms do not improve.  Best wishes,  Dr Charlyne Petrin Tube Dysfunction  Eustachian tube dysfunction refers to a condition in which a blockage develops in the narrow passage that connects the middle ear to the back of the nose (eustachian tube). The eustachian tube regulates air pressure in the middle ear by letting air move between the ear and nose. It also helps to drain fluid from the middle ear space. Eustachian tube dysfunction can affect one or both ears. When the eustachian tube does not function properly, air pressure, fluid, or both can build up in the middle ear. What are the causes? This condition occurs when the eustachian tube becomes blocked or cannot open normally. Common causes of this condition include:  Ear infections.  Colds and other infections that affect the nose, mouth, and throat (upper respiratory tract).  Allergies.  Irritation from cigarette smoke.  Irritation from stomach acid coming up into the esophagus (gastroesophageal reflux). The esophagus is the tube that carries food from the mouth to the stomach.  Sudden changes in air pressure, such as from descending in an airplane or scuba diving.  Abnormal growths in the nose or throat, such as: ? Growths that line the nose (nasal polyps). ? Abnormal growth of cells (tumors). ? Enlarged tissue at the back of the throat (adenoids). What increases the risk? You are more likely to develop this condition if:  You smoke.  You are overweight.  You are a child who has: ? Certain birth defects of the mouth, such as cleft palate. ? Large tonsils or adenoids. What are the signs or symptoms? Common symptoms of this condition include:  A  feeling of fullness in the ear.  Ear pain.  Clicking or popping noises in the ear.  Ringing in the ear.  Hearing loss.  Loss of balance.  Dizziness. Symptoms may get worse when the air pressure around you changes, such as when you travel to an area of high elevation, fly on an airplane, or go scuba diving. How is this diagnosed? This condition may be diagnosed based on:  Your symptoms.  A physical exam of your ears, nose, and throat.  Tests, such as those that measure: ? The movement of your eardrum (tympanogram). ? Your hearing (audiometry). How is this treated? Treatment depends on the cause and severity of your condition.  In mild cases, you may relieve your symptoms by moving air into your ears. This is called "popping the ears."  In more severe cases, or if you have symptoms of fluid in your ears, treatment may include: ? Medicines to relieve congestion (decongestants). ? Medicines that treat allergies (antihistamines). ? Nasal sprays or ear drops that contain medicines that reduce swelling (steroids). ? A procedure to drain the fluid in your eardrum (myringotomy). In this procedure, a small tube is placed in the eardrum to:  Drain the fluid.  Restore the air in the middle ear space. ? A procedure to insert a balloon device through the nose to inflate the opening of the eustachian tube (balloon dilation). Follow these instructions at home: Lifestyle  Do not do any of the following until your health care provider approves: ? Travel to high altitudes. ? Fly  in airplanes. ? Work in a Pension scheme manager or room. ? Scuba dive.  Do not use any products that contain nicotine or tobacco, such as cigarettes and e-cigarettes. If you need help quitting, ask your health care provider.  Keep your ears dry. Wear fitted earplugs during showering and bathing. Dry your ears completely after. General instructions  Take over-the-counter and prescription medicines only as told by  your health care provider.  Use techniques to help pop your ears as recommended by your health care provider. These may include: ? Chewing gum. ? Yawning. ? Frequent, forceful swallowing. ? Closing your mouth, holding your nose closed, and gently blowing as if you are trying to blow air out of your nose.  Keep all follow-up visits as told by your health care provider. This is important. Contact a health care provider if:  Your symptoms do not go away after treatment.  Your symptoms come back after treatment.  You are unable to pop your ears.  You have: ? A fever. ? Pain in your ear. ? Pain in your head or neck. ? Fluid draining from your ear.  Your hearing suddenly changes.  You become very dizzy.  You lose your balance. Summary  Eustachian tube dysfunction refers to a condition in which a blockage develops in the eustachian tube.  It can be caused by ear infections, allergies, inhaled irritants, or abnormal growths in the nose or throat.  Symptoms include ear pain, hearing loss, or ringing in the ears.  Mild cases are treated with maneuvers to unblock the ears, such as yawning or ear popping.  Severe cases are treated with medicines. Surgery may also be done (rare). This information is not intended to replace advice given to you by your health care provider. Make sure you discuss any questions you have with your health care provider. Document Revised: 07/17/2017 Document Reviewed: 07/17/2017 Elsevier Patient Education  [place].

## 2019-12-01 DIAGNOSIS — H6982 Other specified disorders of Eustachian tube, left ear: Secondary | ICD-10-CM | POA: Insufficient documentation

## 2019-12-01 DIAGNOSIS — H6992 Unspecified Eustachian tube disorder, left ear: Secondary | ICD-10-CM | POA: Insufficient documentation

## 2019-12-01 NOTE — Assessment & Plan Note (Addendum)
Symptoms today consistent with eustachian tube dysfunction following URTI 1 month ago. Recommended supportive treatment and explained that there is no indication for antibiotics. Provided with flonase spray which may help with symptoms. Safety return precautions given to pt.

## 2019-12-01 NOTE — Progress Notes (Signed)
° ° °  SUBJECTIVE:   CHIEF COMPLAINT / HPI:   Conor Lata is a 54 yr old male who presents today for ear concerns  Ear concerns Pt endorses left ear fullness and clogged feeling along with reduced hearing for the last week. He feels as though as he has water in his head like a "swimming" sensation. He was treated earlier this month for URTI with doxycycline and bactrim on 2 separate occasions but did not complete the full course. Does have a productive cough producing yellow sputum which has improved over the last month. Denies ear pain, discharge, fevers, chest pain, dyspnea etc. Did have Covid in January 2020 and has had both of his covid vaccines. Denies covid contacts.  PERTINENT  PMH / PSH: Prediabetes, Chronic pain of knees, prediabetes  OBJECTIVE:   BP 112/82    Pulse 91    SpO2 96%   General: Alert and cooperative and appears to be in no acute distress HEENT: Neck non-tender without lymphadenopathy, masses or thyromegaly. Slightly bulging left TM Cardio: Normal S1 and S2, RRR Pulm: CTAB, Normal respiratory effort Abdomen: Bowel sounds normal. Abdomen soft and non-tender.  Extremities: No peripheral edema. Warm/ well perfused.   Neuro: Cranial nerves grossly intact  ASSESSMENT/PLAN:   Eustachian tube dysfunction, left Symptoms today consistent with eustachian tube dysfunction following URTI 1 month ago. Recommended supportive treatment and explained that there is no indication for antibiotics. Provided with flonase spray which may help with symptoms. Safety return precautions given to pt.      Lattie Haw, MD [place]

## 2019-12-09 ENCOUNTER — Ambulatory Visit: Payer: BC Managed Care – PPO | Admitting: Pharmacist

## 2019-12-09 ENCOUNTER — Other Ambulatory Visit: Payer: BC Managed Care – PPO

## 2020-01-25 NOTE — Progress Notes (Signed)
Date:  01/25/2020   HPI: Curtis Trevino is a 54 y.o. male who presents to the [facility] for HIV PrEP follow-up.  Insured   [x]    Uninsured  []    Patient Active Problem List   Diagnosis Date Noted   Eustachian tube dysfunction, left 12/01/2019   Polyarthralgia 04/23/2019   Hyperglycemia 04/23/2019   Prediabetes 04/23/2019   Chronic pain of both knees 03/24/2019   Bilateral hand pain 03/24/2019   Pain of great toe, right 03/24/2019   Cholecystitis with cholelithiasis 11/18/2013    Patient's Medications  New Prescriptions   No medications on file  Previous Medications   ALBUTEROL (VENTOLIN HFA) 108 (90 BASE) MCG/ACT INHALER    Inhale 2 puffs into the lungs every 4 (four) hours as needed for wheezing or shortness of breath.   DICLOFENAC SODIUM (VOLTAREN) 1 % GEL    Apply 4 g topically 4 (four) times daily.   DOXYCYCLINE (VIBRA-TABS) 100 MG TABLET    Take 100 mg by mouth 2 (two) times daily.   EMTRICITABINE-TENOFOVIR (TRUVADA) 200-300 MG TABLET    Take 1 tablet by mouth daily.   FLUTICASONE (FLONASE) 50 MCG/ACT NASAL SPRAY    Place 2 sprays into both nostrils daily.   IBUPROFEN (ADVIL,MOTRIN) 200 MG TABLET    Take 400-600 mg by mouth every 8 (eight) hours as needed (for migraine headaches.).  Modified Medications   No medications on file  Discontinued Medications   No medications on file    Allergies: Allergies  Allergen Reactions   Tylenol [Acetaminophen] Nausea And Vomiting    Vomiting every time he takes tylenol   Codeine Hives, Itching and Rash    Past Medical History: Past Medical History:  Diagnosis Date   Acid reflux    Chronic back pain     Social History: Social History   Socioeconomic History   Marital status: Single    Spouse name: Not on file   Number of children: Not on file   Years of education: Not on file   Highest education level: Not on file  Occupational History   Not on file  Tobacco Use   Smoking status:  Former Smoker    Quit date: 11/15/2010    Years since quitting: 9.2   Smokeless tobacco: Never Used  Substance and Sexual Activity   Alcohol use: Yes    Alcohol/week: 3.0 standard drinks    Types: 3 Shots of liquor per week    Comment: 3 drink a day   Drug use: No   Sexual activity: Not on file  Other Topics Concern   Not on file  Social History Narrative   Not on file   Social Determinants of Health   Financial Resource Strain:    Difficulty of Paying Living Expenses: Not on file  Food Insecurity:    Worried About [place] in the Last Year: Not on file   Ran Out of Food in the Last Year: Not on file  Transportation Needs:    Lack of Transportation (Medical): Not on file   Lack of Transportation (Non-Medical): Not on file  Physical Activity:    Days of Exercise per Week: Not on file   Minutes of Exercise per Session: Not on file  Stress:    Feeling of Stress : Not on file  Social Connections:    Frequency of Communication with Friends and Family: Not on file   Frequency of Social Gatherings with Friends and Family: Not on  file   Attends Religious Services: Not on file   Active Member of Clubs or Organizations: Not on file   Attends Club or Organization Meetings: Not on file   Marital Status: Not on file    [facility] HIV PREP FLOWSHEET RESULTS 01/02/2019 06/13/2018 03/18/2018 12/17/2017 09/18/2017 08/23/2017  Insurance Status Insured Insured Insured Insured - Insured  Gender at birth Male Male Male Male Male Male  Gender identity cis-Male cis-Male cis-Male cis-Male cis-Male cis-Male  Risk for HIV Hx of STI;Condomless vaginal or anal intercourse Hx of STI;Condomless vaginal or anal intercourse >5 partners in past 6 mos (regardless of condom use);Hx of STI;Condomless vaginal or anal intercourse In sexual relationship with HIV+ partner;Condomless vaginal or anal intercourse;>5 partners in past 6 mos (regardless of condom use);Hx of STI >5 partners in past 6 mos  (regardless of condom use) Condomless vaginal or anal intercourse;>5 partners in past 6 mos (regardless of condom use)  Sex Partners Men only Men only Men only Men only Men only Men only  # sex partners past 3-6 mos 1-3 1-3 1-3 1-3 1-3 10-12  Sex activity preferences Insertive and receptive;Oral Insertive and receptive;Oral Insertive and receptive;Oral Insertive and receptive;Oral Receptive;Insertive;Oral Insertive and receptive;Oral  Condom use No No No No Yes Yes  % condom use - - - - 51 65  Partners genders and ages - - M 25-29 M 20-24 M 30-49;M 25-29 M 50+;M 30-49  Treated for STI? No No No No No No  HIV symptoms? N/A N/A N/A N/A - N/A  PrEP Eligibility Substantial risk for HIV Substantial risk for HIV Substantial risk for HIV;Past treatment for potential exposure to HIV from sex or injectable drugs - HIV negative;Substantial risk for HIV Substantial risk for HIV    Labs:  SCr: Lab Results  Component Value Date   CREATININE 1.21 11/15/2019   CREATININE 1.27 06/18/2019   CREATININE 1.19 01/02/2019   CREATININE 1.25 06/13/2018   CREATININE 1.33 03/18/2018   HIV Lab Results  Component Value Date   HIV NON-REACTIVE 09/09/2019   HIV NON-REACTIVE 06/18/2019   HIV NON-REACTIVE 03/19/2019   HIV NON-REACTIVE 01/02/2019   HIV NON-REACTIVE 06/13/2018   Hepatitis B Lab Results  Component Value Date   HEPBSAB NON-REACTIVE 08/23/2017   HEPBSAG NON-REACTIVE 08/23/2017   Hepatitis C Lab Results  Component Value Date   HEPCAB NON-REACTIVE 08/23/2017   Hepatitis A Lab Results  Component Value Date   HAV NON-REACTIVE 08/23/2017   RPR and STI Lab Results  Component Value Date   LABRPR REACTIVE (A) 06/18/2019   LABRPR REACTIVE (A) 06/13/2018   LABRPR REACTIVE (A) 03/18/2018   LABRPR REACTIVE (A) 09/18/2017   LABRPR REACTIVE (A) 08/23/2017   RPRTITER 1:4 (H) 06/18/2019   RPRTITER 1:8 (H) 06/13/2018   RPRTITER 1:8 (H) 03/18/2018   RPRTITER 1:32 (H) 09/18/2017   RPRTITER 1:32  (H) 08/23/2017    STI Results GC CT  06/18/2019 Negative Negative  06/18/2019 Positive(A) Negative  06/18/2019 Positive(A) Negative  06/13/2018 Negative Negative  06/13/2018 Negative Negative  06/13/2018 Negative Negative  03/18/2018 Negative Negative  03/18/2018 Negative Negative  03/18/2018 **POSITIVE**(A) **POSITIVE**(A)  09/18/2017 Negative Negative  09/18/2017 Negative Negative  09/18/2017 Negative Negative  08/23/2017 Negative Negative  08/23/2017 Negative Negative  08/23/2017 **POSITIVE**(A) Negative    Assessment: Curtis Trevino presents today in good spirits for his 26-month PrEP follow-up visit. He has not taken Truvada for ~1 month due to respiratory infection which has resolved. Denies any issues obtaining Truvada from [facility]. Denies signs/symptoms of  acute HIV or STDs. Denies STD checking today. Will check HIV antibody today and see him again in 3 months. Additionally, patient requested influenza and shingles vaccine. Referred patient to receive shingles vaccination from retail pharmacy.  Plan: - Administered influenza vaccination  - Labs: HIV antibody - Truvada x 3 months if HIV negative - F/u in 3 months for PrEP visit  Lorel Monaco, PharmD PGY2 Ambulatory Care Resident [place]

## 2020-01-26 ENCOUNTER — Other Ambulatory Visit: Payer: Self-pay

## 2020-01-26 ENCOUNTER — Ambulatory Visit (INDEPENDENT_AMBULATORY_CARE_PROVIDER_SITE_OTHER): Payer: BC Managed Care – PPO | Admitting: Pharmacist

## 2020-01-26 DIAGNOSIS — Z23 Encounter for immunization: Secondary | ICD-10-CM

## 2020-01-26 DIAGNOSIS — Z79899 Other long term (current) drug therapy: Secondary | ICD-10-CM | POA: Diagnosis not present

## 2020-01-28 ENCOUNTER — Other Ambulatory Visit: Payer: Self-pay | Admitting: Pharmacist

## 2020-01-28 DIAGNOSIS — Z79899 Other long term (current) drug therapy: Secondary | ICD-10-CM

## 2020-01-28 DIAGNOSIS — Z113 Encounter for screening for infections with a predominantly sexual mode of transmission: Secondary | ICD-10-CM

## 2020-01-28 DIAGNOSIS — B20 Human immunodeficiency virus [HIV] disease: Secondary | ICD-10-CM

## 2020-01-28 LAB — HIV-1/2 AB - DIFFERENTIATION
HIV-1 antibody: POSITIVE — AB
HIV-2 Ab: NEGATIVE

## 2020-01-28 LAB — HIV ANTIBODY (ROUTINE TESTING W REFLEX): HIV 1&2 Ab, 4th Generation: REACTIVE — AB

## 2020-01-30 ENCOUNTER — Other Ambulatory Visit (HOSPITAL_COMMUNITY)
Admission: RE | Admit: 2020-01-30 | Discharge: 2020-01-30 | Disposition: A | Discharge status: Home / Self Care | Payer: BC Managed Care – PPO | Source: Ambulatory Visit | Attending: Infectious Disease | Admitting: Infectious Disease

## 2020-01-30 ENCOUNTER — Ambulatory Visit: Payer: BC Managed Care – PPO

## 2020-01-30 ENCOUNTER — Other Ambulatory Visit: Payer: Self-pay

## 2020-01-30 ENCOUNTER — Ambulatory Visit: Payer: BC Managed Care – PPO | Admitting: *Deleted

## 2020-01-30 ENCOUNTER — Other Ambulatory Visit: Payer: BC Managed Care – PPO

## 2020-01-30 DIAGNOSIS — B20 Human immunodeficiency virus [HIV] disease: Secondary | ICD-10-CM

## 2020-01-30 DIAGNOSIS — Z79899 Other long term (current) drug therapy: Secondary | ICD-10-CM

## 2020-01-30 DIAGNOSIS — Z113 Encounter for screening for infections with a predominantly sexual mode of transmission: Secondary | ICD-10-CM | POA: Diagnosis present

## 2020-01-30 LAB — T-HELPER CELL (CD4) - (RCID CLINIC ONLY)
CD4 % Helper T Cell: 43 % (ref 33–65)
CD4 T Cell Abs: 696 /uL (ref 400–1790)

## 2020-01-30 NOTE — Progress Notes (Signed)
Patient here for confirmatory labs after positive HIV antibody testing at routine PrEP appointment with Cassie. RN met with patient, answered his questions regarding the difference between services available, U=U, and the follow up after labs. He met with Timmothy Sours to make sure insurance coverage is appropriate, had labs drawn.   Will follow up Monday with confirmation results. Landis Gandy, RN

## 2020-02-02 LAB — URINE CYTOLOGY ANCILLARY ONLY
Chlamydia: NEGATIVE
Comment: NEGATIVE
Comment: NORMAL
Neisseria Gonorrhea: NEGATIVE

## 2020-02-13 ENCOUNTER — Telehealth: Payer: Self-pay

## 2020-02-13 NOTE — Telephone Encounter (Signed)
[facility] Patient Teacher, English as a foreign language completed.    The patient is insured through [facility] and has a $4.00 copay.  Insurance will pay for the generic Truvada.  We will continue to follow to see if copay assistance is needed.  [name], [facility] Patient [facility] Phone: [phone] Fax:  [phone]

## 2020-02-14 LAB — COMPLETE METABOLIC PANEL WITH GFR
AG Ratio: 1.5 (calc) (ref 1.0–2.5)
ALT: 19 U/L (ref 9–46)
AST: 21 U/L (ref 10–35)
Albumin: 4.3 g/dL (ref 3.6–5.1)
Alkaline phosphatase (APISO): 41 U/L (ref 35–144)
BUN/Creatinine Ratio: 14 (calc) (ref 6–22)
BUN: 19 mg/dL (ref 7–25)
CO2: 28 mmol/L (ref 20–32)
Calcium: 9.6 mg/dL (ref 8.6–10.3)
Chloride: 105 mmol/L (ref 98–110)
Creat: 1.36 mg/dL — ABNORMAL HIGH (ref 0.70–1.33)
GFR, Est African American: 69 mL/min/{1.73_m2} (ref >=60)
GFR, Est Non African American: 59 mL/min/{1.73_m2} — ABNORMAL LOW (ref >=60)
Globulin: 2.9 g/dL (calc) (ref 1.9–3.7)
Glucose, Bld: 108 mg/dL — ABNORMAL HIGH (ref 65–99)
Potassium: 4.6 mmol/L (ref 3.5–5.3)
Sodium: 140 mmol/L (ref 135–146)
Total Bilirubin: 0.4 mg/dL (ref 0.2–1.2)
Total Protein: 7.2 g/dL (ref 6.1–8.1)

## 2020-02-14 LAB — LIPID PANEL
Cholesterol: 191 mg/dL (ref <200)
HDL: 42 mg/dL (ref >=40)
LDL Cholesterol (Calc): 118 mg/dL (calc) — ABNORMAL HIGH
Non-HDL Cholesterol (Calc): 149 mg/dL (calc) — ABNORMAL HIGH (ref <130)
Total CHOL/HDL Ratio: 4.5 (calc) (ref <5.0)
Triglycerides: 193 mg/dL — ABNORMAL HIGH (ref <150)

## 2020-02-14 LAB — HIV-1 RNA ULTRAQUANT REFLEX TO GENTYP+
HIV 1 RNA Quant: 852 copies/mL — ABNORMAL HIGH
HIV-1 RNA Quant, Log: 2.93 Log copies/mL — ABNORMAL HIGH

## 2020-02-14 LAB — URINALYSIS
Bilirubin Urine: NEGATIVE
Glucose, UA: NEGATIVE
Hgb urine dipstick: NEGATIVE
Ketones, ur: NEGATIVE
Leukocytes,Ua: NEGATIVE
Nitrite: NEGATIVE
Specific Gravity, Urine: 1.031 (ref 1.001–1.03)
pH: <=5 (ref 5.0–8.0)

## 2020-02-14 LAB — FLUORESCENT TREPONEMAL AB(FTA)-IGG-BLD: Fluorescent Treponemal ABS: REACTIVE — AB

## 2020-02-14 LAB — QUANTIFERON-TB GOLD PLUS
Mitogen-NIL: >10 IU/mL
NIL: 0.24 IU/mL
QuantiFERON-TB Gold Plus: NEGATIVE
TB1-NIL: 0.07 IU/mL
TB2-NIL: 0.06 IU/mL

## 2020-02-14 LAB — CBC WITH DIFFERENTIAL/PLATELET
Absolute Monocytes: 570 cells/uL (ref 200–950)
Basophils Absolute: 108 cells/uL (ref 0–200)
Basophils Relative: 1.9 %
Eosinophils Absolute: 63 cells/uL (ref 15–500)
Eosinophils Relative: 1.1 %
HCT: 42.7 % (ref 38.5–50.0)
Hemoglobin: 14 g/dL (ref 13.2–17.1)
Lymphs Abs: 1568 cells/uL (ref 850–3900)
MCH: 28 pg (ref 27.0–33.0)
MCHC: 32.8 g/dL (ref 32.0–36.0)
MCV: 85.4 fL (ref 80.0–100.0)
MPV: 11.8 fL (ref 7.5–12.5)
Monocytes Relative: 10 %
Neutro Abs: 3392 cells/uL (ref 1500–7800)
Neutrophils Relative %: 59.5 %
Platelets: 279 10*3/uL (ref 140–400)
RBC: 5 10*6/uL (ref 4.20–5.80)
RDW: 13.7 % (ref 11.0–15.0)
Total Lymphocyte: 27.5 %
WBC: 5.7 10*3/uL (ref 3.8–10.8)

## 2020-02-14 LAB — RPR TITER: RPR Titer: 1:4 {titer} — ABNORMAL HIGH

## 2020-02-14 LAB — HIV-1 GENOTYPE: HIV-1 Genotype: DETECTED — AB

## 2020-02-14 LAB — RPR: RPR Ser Ql: REACTIVE — AB

## 2020-02-14 LAB — HEPATITIS B SURFACE ANTIGEN: Hepatitis B Surface Ag: NONREACTIVE

## 2020-02-14 LAB — HEPATITIS A ANTIBODY, TOTAL: Hepatitis A AB,Total: REACTIVE — AB

## 2020-02-14 LAB — HIV ANTIBODY (ROUTINE TESTING W REFLEX): HIV 1&2 Ab, 4th Generation: REACTIVE — AB

## 2020-02-14 LAB — HEPATITIS B CORE ANTIBODY, TOTAL: Hep B Core Total Ab: NONREACTIVE

## 2020-02-14 LAB — HEPATITIS C ANTIBODY
Hepatitis C Ab: NONREACTIVE
SIGNAL TO CUT-OFF: 0.03 (ref <1.00)

## 2020-02-14 LAB — HIV-1/2 AB - DIFFERENTIATION
HIV-1 antibody: POSITIVE — AB
HIV-2 Ab: NEGATIVE

## 2020-02-14 LAB — HLA B*5701: HLA-B*5701 w/rflx HLA-B High: NEGATIVE

## 2020-02-14 LAB — HEPATITIS B SURFACE ANTIBODY,QUALITATIVE: Hep B S Ab: NONREACTIVE

## 2020-02-16 ENCOUNTER — Other Ambulatory Visit (HOSPITAL_COMMUNITY)
Admission: RE | Admit: 2020-02-16 | Discharge: 2020-02-16 | Disposition: A | Discharge status: Home / Self Care | Payer: BC Managed Care – PPO | Source: Ambulatory Visit | Attending: Internal Medicine | Admitting: Internal Medicine

## 2020-02-16 ENCOUNTER — Encounter: Payer: Self-pay | Admitting: Internal Medicine

## 2020-02-16 ENCOUNTER — Other Ambulatory Visit: Payer: Self-pay

## 2020-02-16 ENCOUNTER — Ambulatory Visit (INDEPENDENT_AMBULATORY_CARE_PROVIDER_SITE_OTHER): Payer: BC Managed Care – PPO | Admitting: Pharmacist

## 2020-02-16 ENCOUNTER — Telehealth: Payer: Self-pay

## 2020-02-16 ENCOUNTER — Ambulatory Visit (INDEPENDENT_AMBULATORY_CARE_PROVIDER_SITE_OTHER): Payer: BC Managed Care – PPO | Admitting: Internal Medicine

## 2020-02-16 VITALS — BP 124/84 | HR 76 | Temp 98.0°F | Wt 191.0 lb

## 2020-02-16 DIAGNOSIS — A539 Syphilis, unspecified: Secondary | ICD-10-CM | POA: Insufficient documentation

## 2020-02-16 DIAGNOSIS — B20 Human immunodeficiency virus [HIV] disease: Secondary | ICD-10-CM | POA: Insufficient documentation

## 2020-02-16 DIAGNOSIS — Z0000 Encounter for general adult medical examination without abnormal findings: Secondary | ICD-10-CM | POA: Insufficient documentation

## 2020-02-16 DIAGNOSIS — Z23 Encounter for immunization: Secondary | ICD-10-CM

## 2020-02-16 DIAGNOSIS — Z7251 High risk heterosexual behavior: Secondary | ICD-10-CM

## 2020-02-16 DIAGNOSIS — R7303 Prediabetes: Secondary | ICD-10-CM

## 2020-02-16 DIAGNOSIS — Z7185 Encounter for immunization safety counseling: Secondary | ICD-10-CM | POA: Insufficient documentation

## 2020-02-16 DIAGNOSIS — Z0001 Encounter for general adult medical examination with abnormal findings: Secondary | ICD-10-CM | POA: Diagnosis not present

## 2020-02-16 MED ORDER — BICTEGRAVIR-EMTRICITAB-TENOFOV 50-200-25 MG PO TABS: 1.0000 | ORAL_TABLET | Freq: Every day | ORAL | 5 refills | Status: DC

## 2020-02-16 NOTE — Assessment & Plan Note (Signed)
Patient with A1c 5.8 in January 2021 and is requesting repeat today.  Not currently on any medications for DM.  Plan: -- check A1c  -- lifestyle modifications

## 2020-02-16 NOTE — Progress Notes (Signed)
[facility] for Infectious Disease  Reason for Consult: HIV new patient  HPI:  Curtis Trevino is a 54 y.o. male with a past medical history as below who presents to clinic as a new patient for HIV care.  He was previously followed in our PrEP clinic but recently seroconverted and had a positive HIV test in October 2021.    HIV diagnosed: 01/2020 CD4 at diagnosis: 696 (43%) on 01/30/20 VL at diagnosis: 852 on 01/30/20 Recent CD4: as above Recent viral load: as above Prior ART: was previously on PrEP with Truvada Current ART: none Hx of OI: none Hx of STI: syphilis, GC, CT Risk factors: MSM  Genotype Hx: 01/2020 - Wild type   Patient's Medications  New Prescriptions   BICTEGRAVIR-EMTRICITABINE-TENOFOVIR AF (BIKTARVY) 50-200-25 MG TABS TABLET    Take 1 tablet by mouth daily.  Previous Medications   IBUPROFEN (ADVIL,MOTRIN) 200 MG TABLET    Take 400-600 mg by mouth every 8 (eight) hours as needed (for migraine headaches.).   Modified Medications   No medications on file  Discontinued Medications   ALBUTEROL (VENTOLIN HFA) 108 (90 BASE) MCG/ACT INHALER    Inhale 2 puffs into the lungs every 4 (four) hours as needed for wheezing or shortness of breath.   DICLOFENAC SODIUM (VOLTAREN) 1 % GEL    Apply 4 g topically 4 (four) times daily.   DOXYCYCLINE (VIBRA-TABS) 100 MG TABLET    Take 100 mg by mouth 2 (two) times daily.   EMTRICITABINE-TENOFOVIR (TRUVADA) 200-300 MG TABLET    Take 1 tablet by mouth daily.   FLUTICASONE (FLONASE) 50 MCG/ACT NASAL SPRAY    Place 2 sprays into both nostrils daily.      Past Medical History:  Diagnosis Date  . Acid reflux   . Chronic back pain     Social History   Tobacco Use  . Smoking status: Former Smoker    Quit date: 11/15/2010    Years since quitting: 9.2  . Smokeless tobacco: Never Used  Substance Use Topics  . Alcohol use: Yes    Alcohol/week: 3.0 standard drinks    Types: 3 Shots of liquor per week    Comment: 3  drink a day  . Drug use: No    Family History  Problem Relation Age of Onset  . Diabetes Mother   . Heart failure Mother   . Cancer Mother   . Heart failure Father   . Cancer Father     Allergies  Allergen Reactions  . Tylenol [Acetaminophen] Nausea And Vomiting    Vomiting every time he takes tylenol  . Codeine Hives, Itching and Rash    Review of Systems: Review of Systems  Constitutional: Negative.   HENT: Negative.   Respiratory: Negative.   Cardiovascular: Negative.   Genitourinary: Negative.   Musculoskeletal: Negative.   Skin: Negative.   Neurological: Negative.   Psychiatric/Behavioral: Negative.     Objective: Vitals:   02/16/20 0850  BP: 124/84  Pulse: 76  Temp: 98 F (36.7 C)  TempSrc: Oral  Weight: 191 lb (86.6 kg)     Body mass index is 28.21 kg/m.   Physical Exam Constitutional:      General: He is not in acute distress.    Appearance: Normal appearance.  HENT:     Head: Normocephalic and atraumatic.  Pulmonary:     Effort: Pulmonary effort is normal. No respiratory distress.  Neurological:     General: No focal  deficit present.     Mental Status: He is alert and oriented to person, place, and time.  Psychiatric:        Mood and Affect: Mood normal.        Behavior: Behavior normal.     Pertinent Labs and Microbiology CMP Latest Ref Rng & Units 01/30/2020 11/15/2019 06/18/2019  Glucose 65 - 99 mg/dL 108(H) 115(H) 125(H)  BUN 7 - 25 mg/dL 19 21(H) 24  Creatinine 0.70 - 1.33 mg/dL 1.36(H) 1.21 1.27  Sodium 135 - 146 mmol/L 140 139 139  Potassium 3.5 - 5.3 mmol/L 4.6 4.1 4.2  Chloride 98 - 110 mmol/L 105 107 105  CO2 20 - 32 mmol/L 28 23 26   Calcium 8.6 - 10.3 mg/dL 9.6 9.6 10.0  Total Protein 6.1 - 8.1 g/dL 7.2 8.0 -  Total Bilirubin 0.2 - 1.2 mg/dL 0.4 0.4 -  Alkaline Phos 38 - 126 U/L - 46 -  AST 10 - 35 U/L 21 23 -  ALT 9 - 46 U/L 19 29 -   CBC Latest Ref Rng & Units 01/30/2020 11/15/2019 04/23/2019  WBC 3.8 - 10.8 Thousand/uL  5.7 6.8 8.1  Hemoglobin 13.2 - 17.1 g/dL 14.0 15.2 14.0  Hematocrit 38 - 50 % 42.7 48.1 40.4  Platelets 140 - 400 Thousand/uL 279 218 262     Lab Results  Component Value Date   HIV1RNAQUANT 852 (H) 01/30/2020   CD4TABS 696 01/30/2020    RPR and STI Lab Results  Component Value Date   LABRPR REACTIVE (A) 01/30/2020   LABRPR REACTIVE (A) 06/18/2019   LABRPR REACTIVE (A) 06/13/2018   LABRPR REACTIVE (A) 03/18/2018   LABRPR REACTIVE (A) 09/18/2017   RPRTITER 1:4 (H) 01/30/2020   RPRTITER 1:4 (H) 06/18/2019   RPRTITER 1:8 (H) 06/13/2018   RPRTITER 1:8 (H) 03/18/2018   RPRTITER 1:32 (H) 09/18/2017    STI Results GC CT  01/30/2020 Negative Negative  06/18/2019 Negative Negative  06/18/2019 Positive(A) Negative  06/18/2019 Positive(A) Negative  06/13/2018 Negative Negative  06/13/2018 Negative Negative  06/13/2018 Negative Negative  03/18/2018 Negative Negative  03/18/2018 Negative Negative  03/18/2018 **POSITIVE**(A) **POSITIVE**(A)  09/18/2017 Negative Negative  09/18/2017 Negative Negative  09/18/2017 Negative Negative  08/23/2017 Negative Negative  08/23/2017 Negative Negative  08/23/2017 **POSITIVE**(A) Negative    Hepatitis B Lab Results  Component Value Date   HEPBSAB NON-REACTIVE 01/30/2020   HEPBSAG NON-REACTIVE 01/30/2020   HEPBCAB NON-REACTIVE 01/30/2020   Hepatitis C Lab Results  Component Value Date   HEPCAB NON-REACTIVE 01/30/2020   Hepatitis A Lab Results  Component Value Date   HAV REACTIVE (A) 01/30/2020   Lipids: Lab Results  Component Value Date   CHOL 191 01/30/2020   TRIG 193 (H) 01/30/2020   HDL 42 01/30/2020   CHOLHDL 4.5 01/30/2020   LDLCALC 118 (H) 01/30/2020    Assessment and Plan: HIV disease ([place]) Curtis Trevino is here today to initiate care for newly diagnosed HIV infection.  He is treatment naive with an initial HIV viral load of 852 on 01/30/20 and a CD4 count of 696 on 01/30/20.  No resistance mutations found on initial genotype. Will start  patient on [place].  Discussed with patient treatment options and side effects, benefits of treatment, long term outcomes.  I discussed the severity of untreated HIV including higher cancer risk, opportunistic infections, renal failure.  Also discussed needing to use condoms, partner disclosure, necessary vaccines, blood monitoring.  All questions answered.    Plan: -- start [place].   Prescription  sent to his pharmacy -- RTC 4 weeks for labs and clinic visit.  Future lab order placed today.  Prediabetes Patient with A1c 5.8 in January 2021 and is requesting repeat today.  Not currently on any medications for DM.  Plan: -- check A1c  -- lifestyle modifications  Immunization counseling Will begin vaccination as needed given new diagnosis of HIV.  Plan: -- Hep B booster today and in 4 weeks.  Then repeat serologies to assess for response about 1-2 months after -- PCV 13 today, PPSV in 8 weeks   Syphilis History of syphilis treated with IM Bicilin in 2019 with adequate serologic response.  Appears serofast at 1:4.  Plan: -- Periodic monitoring of RPR   High risk sexual behavior History of both gonorrhea and chlamydia infection.  Intake labs with negative urine GC/CT.  Plan: -- check anal and pharyngeal swabs today   Orders Placed This Encounter  Procedures  . Heplisav-B (HepB-CPG) Vaccine  . Pneumococcal conjugate vaccine 13-valent IM  . HgB A1c  . CBC    Standing Status:   Future    Standing Expiration Date:   04/17/2020  . Comprehensive metabolic panel    Standing Status:   Future    Standing Expiration Date:   04/17/2020  . HIV-1 RNA quant-no reflex-bld    Standing Status:   Future    Standing Expiration Date:   04/17/2020  . Measles/Mumps/Rubella Immunity    Standing Status:   Future    Standing Expiration Date:   05/18/2020  . Varicella zoster antibody, IgG    Standing Status:   Future    Standing Expiration Date:   05/18/2020     Raynelle [facility] for Infectious Disease [facility] 02/16/2020, 12:11 PM

## 2020-02-16 NOTE — Assessment & Plan Note (Signed)
Will begin vaccination as needed given new diagnosis of HIV.  Plan: -- Hep B booster today and in 4 weeks.  Then repeat serologies to assess for response about 1-2 months after -- PCV 13 today, PPSV in 8 weeks

## 2020-02-16 NOTE — Patient Instructions (Signed)
Thank you for coming to see me today. It was a pleasure seeing you.  I have prescribed a once per day medication called Biktarvy.  Please let us know if there are any issues picking up this medication or issues related to cost.  Please follow-up with lab work in 4 weeks and schedule an office visit a week or two after getting labs done.  If you have any questions or concerns, please do not hesitate to call the office at [phone].  Take Care,   Jule Ser, DO

## 2020-02-16 NOTE — Telephone Encounter (Signed)
[facility] Patient Advocate Encounter   Was successful in obtaining a [place] copay card for [employer].  This copay card will make the patients copay $0.00.  I have spoken with the patient.    The billing information is as follows and has been shared with [place].     [name], [facility] Patient [facility] Phone: [phone] Fax:  [phone]

## 2020-02-16 NOTE — Assessment & Plan Note (Signed)
History of both gonorrhea and chlamydia infection.  Intake labs with negative urine GC/CT.  Plan: -- check anal and pharyngeal swabs today

## 2020-02-16 NOTE — Assessment & Plan Note (Signed)
History of syphilis treated with IM Bicilin in 2019 with adequate serologic response.  Appears serofast at 1:4.  Plan: -- Periodic monitoring of RPR

## 2020-02-16 NOTE — Assessment & Plan Note (Signed)
Curtis Trevino is here today to initiate care for newly diagnosed HIV infection.  He is treatment naive with an initial HIV viral load of 852 on 01/30/20 and a CD4 count of 696 on 01/30/20.  No resistance mutations found on initial genotype. Will start patient on Gueydan.  Discussed with patient treatment options and side effects, benefits of treatment, long term outcomes.  I discussed the severity of untreated HIV including higher cancer risk, opportunistic infections, renal failure.  Also discussed needing to use condoms, partner disclosure, necessary vaccines, blood monitoring.  All questions answered.    Plan: -- start Pease.   Prescription sent to his pharmacy -- RTC 4 weeks for labs and clinic visit.  Future lab order placed today.

## 2020-02-17 ENCOUNTER — Telehealth: Payer: Self-pay

## 2020-02-17 LAB — HEMOGLOBIN A1C
Hgb A1c MFr Bld: 5.8 % of total Hgb — ABNORMAL HIGH (ref <5.7)
Mean Plasma Glucose: 120 (calc)
eAG (mmol/L): 6.6 (calc)

## 2020-02-17 LAB — CYTOLOGY, (ORAL, ANAL, URETHRAL) ANCILLARY ONLY
Chlamydia: NEGATIVE
Chlamydia: NEGATIVE
Comment: NEGATIVE
Comment: NORMAL
Comment: NEGATIVE
Comment: NORMAL
Neisseria Gonorrhea: NEGATIVE
Neisseria Gonorrhea: NEGATIVE

## 2020-02-17 NOTE — Telephone Encounter (Signed)
-----   Message from Mignon Pine, DO sent at 02/17/2020 10:46 AM EST ----- Can you please let patient know that his rectal and pharyngeal swabs for GC and CT were negative.  Also, his A1c is 5.8 which is the same as it was in January.  Nothing to do about it for the moment other than the diet and exercise modifications previously discussed.  Thanks!

## 2020-02-17 NOTE — Telephone Encounter (Signed)
Attempted to call patient to discuss labs. Unable to reach him at this time. Left voicemail requesting call back for lab details.

## 2020-02-17 NOTE — Progress Notes (Signed)
HPI: Curtis Trevino is a 54 y.o. male who presents to the [facility] clinic today to initiate care for a newly diagnosed HIV infection.  Patient Active Problem List   Diagnosis Date Noted  . HIV disease ([place]) 02/16/2020  . Healthcare maintenance 02/16/2020  . Immunization counseling 02/16/2020  . Syphilis 02/16/2020  . High risk sexual behavior 02/16/2020  . Eustachian tube dysfunction, left 12/01/2019  . Polyarthralgia 04/23/2019  . Hyperglycemia 04/23/2019  . Prediabetes 04/23/2019  . Chronic pain of both knees 03/24/2019  . Bilateral hand pain 03/24/2019  . Pain of great toe, right 03/24/2019  . Cholecystitis with cholelithiasis 11/18/2013    Patient's Medications  New Prescriptions   No medications on file  Previous Medications   BICTEGRAVIR-EMTRICITABINE-TENOFOVIR AF (Curtis Trevino) 50-200-25 MG TABS TABLET    Take 1 tablet by mouth daily.   IBUPROFEN (ADVIL,MOTRIN) 200 MG TABLET    Take 400-600 mg by mouth every 8 (eight) hours as needed (for migraine headaches.).   Modified Medications   No medications on file  Discontinued Medications   No medications on file    Allergies: Allergies  Allergen Reactions  . Tylenol [Acetaminophen] Nausea And Vomiting    Vomiting every time he takes tylenol  . Codeine Hives, Itching and Rash    Past Medical History: Past Medical History:  Diagnosis Date  . Acid reflux   . Chronic back pain     Social History: Social History   Socioeconomic History  . Marital status: Single    Spouse name: Not on file  . Number of children: Not on file  . Years of education: Not on file  . Highest education level: Not on file  Occupational History  . Not on file  Tobacco Use  . Smoking status: Former Smoker    Quit date: 11/15/2010    Years since quitting: 9.2  . Smokeless tobacco: Never Used  Substance and Sexual Activity  . Alcohol use: Yes    Alcohol/week: 3.0 standard drinks    Types: 3 Shots of liquor per week    Comment: 3 drink a  day  . Drug use: No  . Sexual activity: Yes    Partners: Male    Comment: declined  Other Topics Concern  . Not on file  Social History Narrative  . Not on file   Social Determinants of Health   Financial Resource Strain:   . Difficulty of Paying Living Expenses: Not on file  Food Insecurity:   . Worried About Charity fundraiser in the Last Year: Not on file  . Ran Out of Food in the Last Year: Not on file  Transportation Needs:   . Lack of Transportation (Medical): Not on file  . Lack of Transportation (Non-Medical): Not on file  Physical Activity:   . Days of Exercise per Week: Not on file  . Minutes of Exercise per Session: Not on file  Stress:   . Feeling of Stress : Not on file  Social Connections:   . Frequency of Communication with Friends and Family: Not on file  . Frequency of Social Gatherings with Friends and Family: Not on file  . Attends Religious Services: Not on file  . Active Member of Clubs or Organizations: Not on file  . Attends Archivist Meetings: Not on file  . Marital Status: Not on file    Labs: Lab Results  Component Value Date   HIV1RNAQUANT 852 (H) 01/30/2020   CD4TABS 696 01/30/2020  RPR and STI Lab Results  Component Value Date   LABRPR REACTIVE (A) 01/30/2020   LABRPR REACTIVE (A) 06/18/2019   LABRPR REACTIVE (A) 06/13/2018   LABRPR REACTIVE (A) 03/18/2018   LABRPR REACTIVE (A) 09/18/2017   RPRTITER 1:4 (H) 01/30/2020   RPRTITER 1:4 (H) 06/18/2019   RPRTITER 1:8 (H) 06/13/2018   RPRTITER 1:8 (H) 03/18/2018   RPRTITER 1:32 (H) 09/18/2017    STI Results GC CT  01/30/2020 Negative Negative  06/18/2019 Negative Negative  06/18/2019 Positive(A) Negative  06/18/2019 Positive(A) Negative  06/13/2018 Negative Negative  06/13/2018 Negative Negative  06/13/2018 Negative Negative  03/18/2018 Negative Negative  03/18/2018 Negative Negative  03/18/2018 **POSITIVE**(A) **POSITIVE**(A)  09/18/2017 Negative Negative  09/18/2017 Negative  Negative  09/18/2017 Negative Negative  08/23/2017 Negative Negative  08/23/2017 Negative Negative  08/23/2017 **POSITIVE**(A) Negative    Hepatitis B Lab Results  Component Value Date   HEPBSAB NON-REACTIVE 01/30/2020   HEPBSAG NON-REACTIVE 01/30/2020   HEPBCAB NON-REACTIVE 01/30/2020   Hepatitis C Lab Results  Component Value Date   HEPCAB NON-REACTIVE 01/30/2020   Hepatitis A Lab Results  Component Value Date   HAV REACTIVE (A) 01/30/2020   Lipids: Lab Results  Component Value Date   CHOL 191 01/30/2020   TRIG 193 (H) 01/30/2020   HDL 42 01/30/2020   CHOLHDL 4.5 01/30/2020   LDLCALC 118 (H) 01/30/2020    Current HIV Regimen: Treatment naive  Assessment: Curtis Trevino is here today to initiate care with Dr. Juleen China for his newly diagnosed HIV infection.  He is treatment naive with an initial HIV viral load of 852 and a CD4 count of 696.  No resistance mutations found on initial genotype. Will start patient on Curtis Trevino.  Explained that Curtis Trevino is a one pill once daily medication with or without food and the importance of not missing any doses. Explained resistance and how it develops and why it is so important to take Curtis Trevino daily and not skip days or doses. Counseled patient to take it around the same time each day. Counseled on what to do if dose is missed, if closer to missed dose take immediately, if closer to next dose then skip and resume normal schedule.   Cautioned on possible side effects the first week or so including nausea, diarrhea, dizziness, and headaches but that they should resolve after the first couple of weeks. I reviewed patient medications and found no drug interactions. Counseled patient to separate Curtis Trevino from divalent cations including multivitamins. Discussed with patient to call clinic if he starts a new medication or herbal supplement. I gave the patient my card and told him to call me with any issues/questions/concerns.  Patient will fill his  Curtis Trevino at [facility] where he works.   Plan: - Start Curtis Trevino - Call with any issues  Nahla Lukin L. Danai Gotto, PharmD, BCIDP, AAHIVP, CPP Clinical Pharmacist Practitioner Infectious Diseases [place] 02/17/2020, 9:27 AM

## 2020-03-17 ENCOUNTER — Encounter: Payer: Self-pay | Admitting: Internal Medicine

## 2020-04-20 NOTE — Progress Notes (Signed)
[facility]   CHIEF COMPLAINT    HIV follow up.  Last seen by me 02/16/20 and started on Biktarvy.  SUBJECTIVE:    Curtis Trevino is a 55 y.o. male with PMHx as below who presents to the clinic for HIV follow up.   Please see A&P for the details of today's visit and status of the patient's medical problems.   Patient's Medications  New Prescriptions   No medications on file  Previous Medications   ASPIRIN-ACETAMINOPHEN-CAFFEINE (EXCEDRIN MIGRAINE) 250-250-65 MG TABLET    Take 1 tablet by mouth every 6 (six) hours as needed for headache.   BICTEGRAVIR-EMTRICITABINE-TENOFOVIR AF (BIKTARVY) 50-200-25 MG TABS TABLET    Take 1 tablet by mouth daily.   IBUPROFEN (ADVIL,MOTRIN) 200 MG TABLET    Take 400-600 mg by mouth every 8 (eight) hours as needed (for migraine headaches.).   Modified Medications   No medications on file  Discontinued Medications   No medications on file    Past Medical History:  Diagnosis Date   Acid reflux    Chronic back pain     Family History  Problem Relation Age of Onset   Diabetes Mother    Heart failure Mother    Cancer Mother    Heart failure Father    Cancer Father     Social History   Socioeconomic History   Marital status: Single    Spouse name: Not on file   Number of children: Not on file   Years of education: Not on file   Highest education level: Not on file  Occupational History   Not on file  Tobacco Use   Smoking status: Former Smoker    Quit date: 11/15/2010    Years since quitting: 9.4   Smokeless tobacco: Never Used  Substance and Sexual Activity   Alcohol use: Yes    Alcohol/week: 3.0 standard drinks    Types: 3 Shots of liquor per week    Comment: 3 drink a day   Drug use: No   Sexual activity: Yes    Partners: Male    Comment: declined 04/21/20  Other Topics Concern   Not on file  Social History Narrative   Not on file   Social Determinants of Health    Financial Resource Strain: Not on file  Food Insecurity: Not on file  Transportation Needs: Not on file  Physical Activity: Not on file  Stress: Not on file  Social Connections: Not on file  Intimate Partner Violence: Not on file    Review of Systems  Constitutional: Negative for chills and fever.  Respiratory: Negative.   Cardiovascular: Negative.   Neurological: Positive for headaches.     OBJECTIVE:    Vitals:   04/21/20 1418  BP: 113/79  Pulse: 98  Temp: 98.1 F (36.7 C)  TempSrc: Oral  Weight: 187 lb (84.8 kg)     Body mass index is 27.62 kg/m.  Physical Exam Constitutional:      General: He is not in acute distress.    Appearance: Normal appearance.  Pulmonary:     Effort: Pulmonary effort is normal. No respiratory distress.  Neurological:     General: No focal deficit present.     Mental Status: He is alert and oriented to person, place, and time.  Psychiatric:        Mood and Affect: Mood normal.        Behavior: Behavior normal.  Labs and Microbiology: CMP Latest Ref Rng & Units 01/30/2020 11/15/2019 06/18/2019  Glucose 65 - 99 mg/dL 108(H) 115(H) 125(H)  BUN 7 - 25 mg/dL 19 21(H) 24  Creatinine 0.70 - 1.33 mg/dL 1.36(H) 1.21 1.27  Sodium 135 - 146 mmol/L 140 139 139  Potassium 3.5 - 5.3 mmol/L 4.6 4.1 4.2  Chloride 98 - 110 mmol/L 105 107 105  CO2 20 - 32 mmol/L 28 23 26   Calcium 8.6 - 10.3 mg/dL 9.6 9.6 10.0  Total Protein 6.1 - 8.1 g/dL 7.2 8.0 -  Total Bilirubin 0.2 - 1.2 mg/dL 0.4 0.4 -  Alkaline Phos 38 - 126 U/L - 46 -  AST 10 - 35 U/L 21 23 -  ALT 9 - 46 U/L 19 29 -   CBC Latest Ref Rng & Units 01/30/2020 11/15/2019 04/23/2019  WBC 3.8 - 10.8 Thousand/uL 5.7 6.8 8.1  Hemoglobin 13.2 - 17.1 g/dL 14.0 15.2 14.0  Hematocrit 38.5 - 50.0 % 42.7 48.1 40.4  Platelets 140 - 400 Thousand/uL 279 218 262     Lab Results  Component Value Date   HIV1RNAQUANT 852 (H) 01/30/2020   CD4TABS 696 01/30/2020    RPR and STI: Lab Results   Component Value Date   LABRPR REACTIVE (A) 01/30/2020   LABRPR REACTIVE (A) 06/18/2019   LABRPR REACTIVE (A) 06/13/2018   LABRPR REACTIVE (A) 03/18/2018   LABRPR REACTIVE (A) 09/18/2017   RPRTITER 1:4 (H) 01/30/2020   RPRTITER 1:4 (H) 06/18/2019   RPRTITER 1:8 (H) 06/13/2018   RPRTITER 1:8 (H) 03/18/2018   RPRTITER 1:32 (H) 09/18/2017    STI Results GC CT  02/16/2020 Negative Negative  02/16/2020 Negative Negative  01/30/2020 Negative Negative  06/18/2019 Negative Negative  06/18/2019 Positive(A) Negative  06/18/2019 Positive(A) Negative  06/13/2018 Negative Negative  06/13/2018 Negative Negative  06/13/2018 Negative Negative  03/18/2018 Negative Negative  03/18/2018 Negative Negative  03/18/2018 **POSITIVE**(A) **POSITIVE**(A)  09/18/2017 Negative Negative  09/18/2017 Negative Negative  09/18/2017 Negative Negative  08/23/2017 Negative Negative  08/23/2017 Negative Negative  08/23/2017 **POSITIVE**(A) Negative    Hepatitis B: Lab Results  Component Value Date   HEPBSAB NON-REACTIVE 01/30/2020   HEPBSAG NON-REACTIVE 01/30/2020   HEPBCAB NON-REACTIVE 01/30/2020   Hepatitis C: Lab Results  Component Value Date   HEPCAB NON-REACTIVE 01/30/2020   Hepatitis A: Lab Results  Component Value Date   HAV REACTIVE (A) 01/30/2020   Lipids: Lab Results  Component Value Date   CHOL 191 01/30/2020   TRIG 193 (H) 01/30/2020   HDL 42 01/30/2020   CHOLHDL 4.5 01/30/2020   LDLCALC 118 (H) 01/30/2020    Imaging: No interval imaging since last visit.    ASSESSMENT & PLAN:    HIV disease ([place]) Curtis Trevino is a 55 y.o. male with HIV infection who is doing very well on Biktarvy ART regimen.  He did not obtain future labs prior to today's appointment.   He reports shortly after starting Biktarvy that his headaches worsened.  He has dealt with migraines much of his adult life. After a few weeks, they then dissipated but he will have occasional severe headaches that are unrelieved by  his OTC meds.  He also reports stress at work lately.   PLAN:  Continue Biktarvy  Check labs today  I am not totally sure if Curtis Trevino is the etiology of his headaches and would favor continuing for now  RTC 3 months with labs and office visit  Immunization counseling We reviewed their immunization history and discussed  current vaccine recommendations for people living with HIV.  PLAN:  Hepatitis B booster #2 today, then repeat serologies at follow up  PPSV 23 today  Menveo #1 today and in 8 weeks  Received Pfizer x 2 in Spring 2021.  Recommend booster  Syphilis Hx of syphilis treated with Bicillin in 2019.  Appears serofast at titer 1:4.  PLAN:  Repeat RPR  Periodic serologic RPR monitoring  Headache Patient reports recently worsening of migraines after starting Biktarvy that then they eased up.  He is occasionally having headache days that rates as 10/10 but they are not daily.  When he does have them he reports they are unrelieved by OTC meds.  He would like to consider other treatment options such as Nurtec ODT.  No alarm symptoms of fever, chills, focal weakness.  He is also not sure if they may be related to BP (well controlled today) or stress at work.  PLAN:  Continue Biktarvy as I am not convinced these are the etiology of headaches  Follow up with PCP regarding further treatment option discussion    Orders Placed This Encounter  Procedures   CBC   COMPLETE METABOLIC PANEL WITH GFR   HIV-1 RNA quant-no reflex-bld   RPR   T-helper cell (CD4)- ([facility] clinic only)     Curtis Trevino for Infectious Disease [facility] 04/21/2020, 2:44 PM

## 2020-04-21 ENCOUNTER — Encounter: Payer: Self-pay | Admitting: Internal Medicine

## 2020-04-21 ENCOUNTER — Other Ambulatory Visit: Payer: Self-pay

## 2020-04-21 ENCOUNTER — Ambulatory Visit (INDEPENDENT_AMBULATORY_CARE_PROVIDER_SITE_OTHER): Payer: BC Managed Care – PPO | Admitting: Internal Medicine

## 2020-04-21 VITALS — BP 113/79 | HR 98 | Temp 98.1°F | Wt 187.0 lb

## 2020-04-21 DIAGNOSIS — B20 Human immunodeficiency virus [HIV] disease: Secondary | ICD-10-CM

## 2020-04-21 DIAGNOSIS — Z23 Encounter for immunization: Secondary | ICD-10-CM

## 2020-04-21 DIAGNOSIS — Z7251 High risk heterosexual behavior: Secondary | ICD-10-CM

## 2020-04-21 DIAGNOSIS — Z7185 Encounter for immunization safety counseling: Secondary | ICD-10-CM | POA: Diagnosis not present

## 2020-04-21 DIAGNOSIS — R519 Headache, unspecified: Secondary | ICD-10-CM | POA: Diagnosis not present

## 2020-04-21 DIAGNOSIS — A539 Syphilis, unspecified: Secondary | ICD-10-CM | POA: Diagnosis not present

## 2020-04-21 NOTE — Addendum Note (Signed)
Addended by: Daisy Floro T on: 04/21/2020 02:57 PM   Modules accepted: Orders

## 2020-04-21 NOTE — Assessment & Plan Note (Signed)
Patient reports recently worsening of migraines after starting Biktarvy that then they eased up.  He is occasionally having headache days that rates as 10/10 but they are not daily.  When he does have them he reports they are unrelieved by OTC meds.  He would like to consider other treatment options such as Nurtec ODT.  No alarm symptoms of fever, chills, focal weakness.  He is also not sure if they may be related to BP (well controlled today) or stress at work.  PLAN: . Continue Biktarvy as I am not convinced these are the etiology of headaches . Follow up with PCP regarding further treatment option discussion

## 2020-04-21 NOTE — Assessment & Plan Note (Signed)
Hx of syphilis treated with Bicillin in 2019.  Appears serofast at titer 1:4.  PLAN: . Repeat RPR . Periodic serologic RPR monitoring

## 2020-04-21 NOTE — Assessment & Plan Note (Deleted)
Hx of both gonorrhea and chlamydia infection.  PLAN: . Check urine, rectal, oral GC/CT

## 2020-04-21 NOTE — Assessment & Plan Note (Addendum)
We reviewed their immunization history and discussed current vaccine recommendations for people living with HIV.  PLAN: . Hepatitis B booster #2 today, then repeat serologies at follow up . PPSV 23 today . Menveo #1 today and in 8 weeks . Received Pfizer x 2 in Spring 2021.  Recommend booster

## 2020-04-21 NOTE — Assessment & Plan Note (Addendum)
Curtis Trevino is a 55 y.o. male with HIV infection who is doing very well on Biktarvy ART regimen.  He did not obtain future labs prior to today's appointment.   He reports shortly after starting Biktarvy that his headaches worsened.  He has dealt with migraines much of his adult life. After a few weeks, they then dissipated but he will have occasional severe headaches that are unrelieved by his OTC meds.  He also reports stress at work lately.   PLAN: . Continue Biktarvy . Check labs today . I am not totally sure if Phillips Odor is the etiology of his headaches and would favor continuing for now . RTC 3 months with labs and office visit

## 2020-04-21 NOTE — Patient Instructions (Signed)
Thank you for coming to see me today. It was a pleasure seeing you.  * we gave 2nd dose of hepatitis B, pneumonia vaccine, and meningitis #1 today * please think about getting your COVID booster * please schedule an appointment with your primary regarding migraines  Please follow-up with me in 3 months with lab work about 1-2 weeks beforehand  If you have any questions or concerns, please do not hesitate to call the office at [phone].  Take Care,   Jule Ser, DO

## 2020-04-22 LAB — T-HELPER CELL (CD4) - (RCID CLINIC ONLY)
CD4 % Helper T Cell: 45 % (ref 33–65)
CD4 T Cell Abs: 819 /uL (ref 400–1790)

## 2020-04-26 ENCOUNTER — Ambulatory Visit: Payer: BC Managed Care – PPO | Admitting: Pharmacist

## 2020-04-26 LAB — COMPLETE METABOLIC PANEL WITH GFR
AG Ratio: 1.4 (calc) (ref 1.0–2.5)
ALT: 17 U/L (ref 9–46)
AST: 19 U/L (ref 10–35)
Albumin: 4.2 g/dL (ref 3.6–5.1)
Alkaline phosphatase (APISO): 41 U/L (ref 35–144)
BUN/Creatinine Ratio: 14 (calc) (ref 6–22)
BUN: 21 mg/dL (ref 7–25)
CO2: 24 mmol/L (ref 20–32)
Calcium: 9.6 mg/dL (ref 8.6–10.3)
Chloride: 108 mmol/L (ref 98–110)
Creat: 1.49 mg/dL — ABNORMAL HIGH (ref 0.70–1.33)
GFR, Est African American: 62 mL/min/{1.73_m2} (ref >=60)
GFR, Est Non African American: 53 mL/min/{1.73_m2} — ABNORMAL LOW (ref >=60)
Globulin: 2.9 g/dL (calc) (ref 1.9–3.7)
Glucose, Bld: 106 mg/dL — ABNORMAL HIGH (ref 65–99)
Potassium: 3.9 mmol/L (ref 3.5–5.3)
Sodium: 142 mmol/L (ref 135–146)
Total Bilirubin: 0.4 mg/dL (ref 0.2–1.2)
Total Protein: 7.1 g/dL (ref 6.1–8.1)

## 2020-04-26 LAB — CBC
HCT: 42.7 % (ref 38.5–50.0)
Hemoglobin: 14.5 g/dL (ref 13.2–17.1)
MCH: 28.7 pg (ref 27.0–33.0)
MCHC: 34 g/dL (ref 32.0–36.0)
MCV: 84.6 fL (ref 80.0–100.0)
MPV: 11.7 fL (ref 7.5–12.5)
Platelets: 285 10*3/uL (ref 140–400)
RBC: 5.05 10*6/uL (ref 4.20–5.80)
RDW: 14.4 % (ref 11.0–15.0)
WBC: 7.1 10*3/uL (ref 3.8–10.8)

## 2020-04-26 LAB — RPR TITER: RPR Titer: 1:4 {titer} — ABNORMAL HIGH

## 2020-04-26 LAB — HIV-1 RNA QUANT-NO REFLEX-BLD
HIV 1 RNA Quant: <20 Copies/mL — ABNORMAL HIGH
HIV-1 RNA Quant, Log: <1.3 Log cps/mL — ABNORMAL HIGH

## 2020-04-26 LAB — RPR: RPR Ser Ql: REACTIVE — AB

## 2020-04-26 LAB — FLUORESCENT TREPONEMAL AB(FTA)-IGG-BLD: Fluorescent Treponemal ABS: REACTIVE — AB

## 2020-04-27 ENCOUNTER — Telehealth: Payer: Self-pay

## 2020-04-27 NOTE — Telephone Encounter (Signed)
-----   Message from Mignon Pine, DO sent at 04/27/2020  8:42 AM EST ----- Can you please let patient know that his labs are all stable.  Creatinine is a little elevated but not significantly changed and may be from using a lot of NSAIDs.  Viral load is <20, CD4 count over 800, and syphilis titer stable which is consistent with his prior treatment.   Thanks, Mitzi Hansen

## 2020-04-27 NOTE — Telephone Encounter (Signed)
Patient made aware of labs results via voicemail. Advised to call the office with any questions. Eugenia Mcalpine

## 2020-05-21 ENCOUNTER — Telehealth: Payer: Self-pay

## 2020-05-21 NOTE — Telephone Encounter (Signed)
[facility] Patient Advocate Encounter   I was successful in securing patient a $7500.00 grant from [employer] ([employer] to provide copayment coverage for Biktarvy.  This will make the out of pocket cost $0.00.     I have spoken with the patient.        Patient knows to call the office with questions or concerns.  Drae Mitzel, CPhT Specialty Pharmacy Patient Advocate [facility] Phone: [phone] Fax:  [phone]  

## 2020-06-08 ENCOUNTER — Other Ambulatory Visit: Payer: Self-pay

## 2020-06-08 ENCOUNTER — Ambulatory Visit (INDEPENDENT_AMBULATORY_CARE_PROVIDER_SITE_OTHER): Payer: BC Managed Care – PPO | Admitting: Internal Medicine

## 2020-06-08 ENCOUNTER — Encounter: Payer: Self-pay | Admitting: Internal Medicine

## 2020-06-08 ENCOUNTER — Other Ambulatory Visit (HOSPITAL_COMMUNITY)
Admission: RE | Admit: 2020-06-08 | Discharge: 2020-06-08 | Disposition: A | Discharge status: Home / Self Care | Payer: BC Managed Care – PPO | Source: Ambulatory Visit | Attending: Internal Medicine | Admitting: Internal Medicine

## 2020-06-08 ENCOUNTER — Telehealth: Payer: Self-pay

## 2020-06-08 VITALS — BP 110/65 | HR 46 | Temp 98.0°F | Ht 68.0 in | Wt 188.0 lb

## 2020-06-08 DIAGNOSIS — A539 Syphilis, unspecified: Secondary | ICD-10-CM | POA: Insufficient documentation

## 2020-06-08 DIAGNOSIS — Z7251 High risk heterosexual behavior: Secondary | ICD-10-CM | POA: Diagnosis not present

## 2020-06-08 DIAGNOSIS — B20 Human immunodeficiency virus [HIV] disease: Secondary | ICD-10-CM | POA: Diagnosis not present

## 2020-06-08 MED ORDER — PENICILLIN G BENZATHINE 1200000 UNIT/2ML IM SUSP
2.4000 10*6.[IU] | Freq: Once | INTRAMUSCULAR | Status: AC
  Administered 2020-06-08: 2.4 10*6.[IU] via INTRAMUSCULAR

## 2020-06-08 NOTE — Assessment & Plan Note (Signed)
Will check urine, rectal, and oral GC/CT and treat if positive.

## 2020-06-08 NOTE — Assessment & Plan Note (Signed)
He has a recent contact with partner who has been exposed to syphilis and is undergoing treatment through the health department.  Will give Kara Bicillin 2.4 million units IM today and recheck RPR.  Will follow up with me again in April.

## 2020-06-08 NOTE — Addendum Note (Signed)
Addended by: Eugenia Mcalpine on: 06/08/2020 03:41 PM   Modules accepted: Orders

## 2020-06-08 NOTE — Progress Notes (Signed)
[facility]ious Disease   CHIEF COMPLAINT    HIV follow up.  Last seen by me on 04/21/20  SUBJECTIVE:    Curtis Trevino is a 55 y.o. male with PMHx as below who presents to the clinic for STI exposure.    His partner recently had sex with someone who tested positive for syphilis.  His partner is getting treated through the health department but Curtis Trevino and his partner have been sexually active without condom use during this period of time so he would like to get tested and treated.  The index patient with syphilis did not report any other positive STI.  Curtis Trevino is asymptomatic and has not noticed any changes.   Patient's Medications  New Prescriptions   No medications on file  Previous Medications   ASPIRIN-ACETAMINOPHEN-CAFFEINE (EXCEDRIN MIGRAINE) 250-250-65 MG TABLET    Take 1 tablet by mouth every 6 (six) hours as needed for headache.   BICTEGRAVIR-EMTRICITABINE-TENOFOVIR AF (BIKTARVY) 50-200-25 MG TABS TABLET    Take 1 tablet by mouth daily.   IBUPROFEN (ADVIL,MOTRIN) 200 MG TABLET    Take 400-600 mg by mouth every 8 (eight) hours as needed (for migraine headaches.).   Modified Medications   No medications on file  Discontinued Medications   No medications on file      Past Medical History:  Diagnosis Date  . Acid reflux   . Chronic back pain     Social History   Tobacco Use  . Smoking status: Former Smoker    Quit date: 11/15/2010    Years since quitting: 9.5  . Smokeless tobacco: Never Used  Substance Use Topics  . Alcohol use: Yes    Alcohol/week: 3.0 standard drinks    Types: 3 Shots of liquor per week    Comment: 3 drink a day  . Drug use: No    Family History  Problem Relation Age of Onset  . Diabetes Mother   . Heart failure Mother   . Cancer Mother   . Heart failure Father   . Cancer Father     Allergies  Allergen Reactions  . Tylenol [Acetaminophen] Nausea And Vomiting    Vomiting every time he takes tylenol  .  Codeine Hives, Itching and Rash    Review of Systems  Constitutional: Negative for fever.  Genitourinary: Negative for dysuria.  Skin: Negative for rash.     OBJECTIVE:    Vitals:   06/08/20 1518  BP: 110/65  Pulse: (!) 46  Temp: 98 F (36.7 C)  Weight: 188 lb (85.3 kg)  Height: 5\' 8"  (1.727 m)     Body mass index is 28.59 kg/m.  Physical Exam Constitutional:      General: He is not in acute distress.    Appearance: Normal appearance.  Skin:    General: Skin is warm and dry.     Findings: No rash.  Neurological:     General: No focal deficit present.     Mental Status: He is alert and oriented to person, place, and time.  Psychiatric:        Mood and Affect: Mood normal.        Behavior: Behavior normal.     Labs and Microbiology: CMP Latest Ref Rng & Units 04/21/2020 01/30/2020 11/15/2019  Glucose 65 - 99 mg/dL 106(H) 108(H) 115(H)  BUN 7 - 25 mg/dL 21 19 21(H)  Creatinine 0.70 - 1.33 mg/dL 1.49(H) 1.36(H) 1.21  Sodium 135 - 146  mmol/L 142 140 139  Potassium 3.5 - 5.3 mmol/L 3.9 4.6 4.1  Chloride 98 - 110 mmol/L 108 105 107  CO2 20 - 32 mmol/L 24 28 23   Calcium 8.6 - 10.3 mg/dL 9.6 9.6 9.6  Total Protein 6.1 - 8.1 g/dL 7.1 7.2 8.0  Total Bilirubin 0.2 - 1.2 mg/dL 0.4 0.4 0.4  Alkaline Phos 38 - 126 U/L - - 46  AST 10 - 35 U/L 19 21 23   ALT 9 - 46 U/L 17 19 29    CBC Latest Ref Rng & Units 04/21/2020 01/30/2020 11/15/2019  WBC 3.8 - 10.8 Thousand/uL 7.1 5.7 6.8  Hemoglobin 13.2 - 17.1 g/dL 14.5 14.0 15.2  Hematocrit 38.5 - 50.0 % 42.7 42.7 48.1  Platelets 140 - 400 Thousand/uL 285 279 218     Lab Results  Component Value Date   HIV1RNAQUANT <20 (H) 04/21/2020   HIV1RNAQUANT 852 (H) 01/30/2020   CD4TABS 819 04/21/2020   CD4TABS 696 01/30/2020    RPR and STI: Lab Results  Component Value Date   LABRPR REACTIVE (A) 04/21/2020   LABRPR REACTIVE (A) 01/30/2020   LABRPR REACTIVE (A) 06/18/2019   LABRPR REACTIVE (A) 06/13/2018   LABRPR REACTIVE (A)  03/18/2018   RPRTITER 1:4 (H) 04/21/2020   RPRTITER 1:4 (H) 01/30/2020   RPRTITER 1:4 (H) 06/18/2019   RPRTITER 1:8 (H) 06/13/2018   RPRTITER 1:8 (H) 03/18/2018    STI Results GC CT  02/16/2020 Negative Negative  02/16/2020 Negative Negative  01/30/2020 Negative Negative  06/18/2019 Negative Negative  06/18/2019 Positive(A) Negative  06/18/2019 Positive(A) Negative  06/13/2018 Negative Negative  06/13/2018 Negative Negative  06/13/2018 Negative Negative  03/18/2018 Negative Negative  03/18/2018 Negative Negative  03/18/2018 **POSITIVE**(A) **POSITIVE**(A)  09/18/2017 Negative Negative  09/18/2017 Negative Negative  09/18/2017 Negative Negative  08/23/2017 Negative Negative  08/23/2017 Negative Negative  08/23/2017 **POSITIVE**(A) Negative    Hepatitis B: Lab Results  Component Value Date   HEPBSAB NON-REACTIVE 01/30/2020   HEPBSAG NON-REACTIVE 01/30/2020   HEPBCAB NON-REACTIVE 01/30/2020   Hepatitis C: Lab Results  Component Value Date   HEPCAB NON-REACTIVE 01/30/2020   Hepatitis A: Lab Results  Component Value Date   HAV REACTIVE (A) 01/30/2020   Lipids: Lab Results  Component Value Date   CHOL 191 01/30/2020   TRIG 193 (H) 01/30/2020   HDL 42 01/30/2020   CHOLHDL 4.5 01/30/2020   LDLCALC 118 (H) 01/30/2020       ASSESSMENT & PLAN:    Syphilis He has a recent contact with partner who has been exposed to syphilis and is undergoing treatment through the health department.  Will give [facility] Bicillin 2.4 million units IM today and recheck RPR.  Will follow up with me again in April.  High risk sexual behavior Will check urine, rectal, and oral GC/CT and treat if positive.    HIV disease ([place]) Continue Biktarvy.  His viral load last month was <20.  Will repeat at follow up in April.    Orders Placed This Encounter  Procedures  . [facility] 06/08/2020, 3:36 PM

## 2020-06-08 NOTE — Patient Instructions (Signed)
Thank you for coming to see me today. It was a pleasure seeing you.  To Do: Marland Kitchen Swabs and urine today for gonorrhea/chlamydia . Check RPR and also presumptively treat you for syphilis . Follow up with me as scheduled . I will update you with results  If you have any questions or concerns, please do not hesitate to call the office at [phone].  Take Care,   Jule Ser, DO

## 2020-06-08 NOTE — Telephone Encounter (Signed)
Called patient for 3pm appointment today. Patient has yet to check in. Willing to offer patient a phone visit if he returns call. Eugenia Mcalpine

## 2020-06-08 NOTE — Assessment & Plan Note (Signed)
Continue Biktarvy.  His viral load last month was <20.  Will repeat at follow up in April.

## 2020-06-09 LAB — CYTOLOGY, (ORAL, ANAL, URETHRAL) ANCILLARY ONLY
Chlamydia: NEGATIVE
Chlamydia: NEGATIVE
Comment: NEGATIVE
Comment: NEGATIVE
Comment: NORMAL
Comment: NORMAL
Neisseria Gonorrhea: NEGATIVE
Neisseria Gonorrhea: NEGATIVE

## 2020-06-09 LAB — URINE CYTOLOGY ANCILLARY ONLY
Chlamydia: NEGATIVE
Comment: NEGATIVE
Comment: NORMAL
Neisseria Gonorrhea: NEGATIVE

## 2020-06-10 ENCOUNTER — Telehealth: Payer: Self-pay

## 2020-06-10 LAB — RPR TITER: RPR Titer: 1:8 {titer} — ABNORMAL HIGH

## 2020-06-10 LAB — RPR: RPR Ser Ql: REACTIVE — AB

## 2020-06-10 LAB — FLUORESCENT TREPONEMAL AB(FTA)-IGG-BLD: Fluorescent Treponemal ABS: REACTIVE — AB

## 2020-06-10 NOTE — Telephone Encounter (Addendum)
I called patient and relayed normal GC/CT and  RPR titer results. Patient verbalized understanding and had no question or concerns [facility]

## 2020-06-10 NOTE — Telephone Encounter (Signed)
-----   Message from Mignon Pine, DO sent at 06/10/2020  9:18 AM EST ----- Please let pt know that GC/CT testing from 3 sites was negative. RPR titer went from 1:4 to 1:8 which is not clinically significant.  To be considered a reinfection the titer would have to increase four-fold (I.e 1:4 up to 1:16).  Nonetheless he was treated based on recent exposure.  We will continue to monitor his RPR at follow up. Thanks, Mitzi Hansen

## 2020-07-15 ENCOUNTER — Other Ambulatory Visit (HOSPITAL_COMMUNITY): Payer: Self-pay

## 2020-07-16 ENCOUNTER — Telehealth: Payer: Self-pay

## 2020-07-16 ENCOUNTER — Other Ambulatory Visit (HOSPITAL_COMMUNITY): Payer: Self-pay

## 2020-07-16 NOTE — Telephone Encounter (Signed)
[facility] Patient Advocate Encounter  I gave patient 1 bottle (7 day supply) of Biktarvy samples.   Patient have absorbed his PAF funds and now his [place] coupon card. He have a copay of $114.00.  I called patient insurance and started a Pa for a lower cost in the Tier drug.   [id number]  [name], [place] Patient [facility] for Infectious Disease Phone: [phone] Fax:  [phone]

## 2020-07-19 ENCOUNTER — Telehealth: Payer: Self-pay

## 2020-07-19 ENCOUNTER — Other Ambulatory Visit (HOSPITAL_COMMUNITY): Payer: Self-pay

## 2020-07-19 NOTE — Telephone Encounter (Signed)
[facility] Patient Advocate Encounter  Prior Authorization for [facility] Tre[facility]ino has been approved.    PA# [id number] Effective dates: 07/16/20 through 07/16/21  Decision Notes: Biktarvy Tab, use as directed , is approved for a tier lowering exception at the tier 02 copay, for 12 months .  [facility] will continue to follow.  [name], [facility] Patient [facility] for Infectious Disease Phone: [phone] Fax:  [phone]

## 2020-07-20 ENCOUNTER — Encounter: Payer: Self-pay | Admitting: Internal Medicine

## 2020-07-20 ENCOUNTER — Other Ambulatory Visit: Payer: Self-pay

## 2020-07-20 ENCOUNTER — Ambulatory Visit (INDEPENDENT_AMBULATORY_CARE_PROVIDER_SITE_OTHER): Payer: BC Managed Care – PPO | Admitting: Internal Medicine

## 2020-07-20 VITALS — BP 111/75 | HR 92 | Temp 97.7°F | Ht 68.0 in | Wt 184.0 lb

## 2020-07-20 DIAGNOSIS — B20 Human immunodeficiency virus [HIV] disease: Secondary | ICD-10-CM

## 2020-07-20 DIAGNOSIS — K625 Hemorrhage of anus and rectum: Secondary | ICD-10-CM

## 2020-07-20 DIAGNOSIS — R519 Headache, unspecified: Secondary | ICD-10-CM

## 2020-07-20 DIAGNOSIS — Z5181 Encounter for therapeutic drug level monitoring: Secondary | ICD-10-CM | POA: Insufficient documentation

## 2020-07-20 DIAGNOSIS — Z7251 High risk heterosexual behavior: Secondary | ICD-10-CM | POA: Diagnosis not present

## 2020-07-20 DIAGNOSIS — Z7185 Encounter for immunization safety counseling: Secondary | ICD-10-CM | POA: Diagnosis not present

## 2020-07-20 DIAGNOSIS — A539 Syphilis, unspecified: Secondary | ICD-10-CM | POA: Diagnosis not present

## 2020-07-20 NOTE — Assessment & Plan Note (Signed)
Patient seen in March 2022 due to concern for STI exposure to syphilis.  Was treated with Bicillin x 1 at that time and RPR was found to not be elevated 4-fold.  Will repeat RPR today to ensure stable.

## 2020-07-20 NOTE — Assessment & Plan Note (Signed)
Presents today with primary complaint of nausea for 2 weeks followed recently by non-bloody, non-bilious emesis the past 2 days.  Also in the last 2 days he has noted bright red blood per rectum with bowel movements in the morning.  He reports previously having a colonoscopy with [facility] (Dr Michail Sermon) about 2 years ago and was told he had hemorrhoids.  He has no rectal pain and no melena or coffee-ground emesis.  He has been consuming significant amount of NSAIDs which increases risk for ulcer although BRBPR more c/w lower GI bleed (hemorrhoid vs diverticular).  He also reports recent GI illness with his partner and child a couple weeks back so this may be related to a gastroenteritis, however, the bleeding is concerning.  He is fortunately normotensive this morning and does not have any pre-syncopal symptoms so will check labs today to ensure hemoglobin is stable and have patient urgently referred back to GI to consider endoscopic evaluation.  Also recommended that he reduce NSAID intake due to increased risk of bleeding and already mildly elevated creatinine.

## 2020-07-20 NOTE — Assessment & Plan Note (Signed)
Patient currently on [place] and tolerating well thus far.  Most recent viral load in Jan 2022 was less than 20 copies and CD4 count 819 (45%).  Will check labs today and plan to continue [place] unless any issues.  RTC 3 months.  He did encounter some issues with insurance recently and we thought he may need to change medications but our pharmacy staff was able to get PA approved for [facility] and $0 co-pay.  He was advised today that he can pick up prescription from pharmacy

## 2020-07-20 NOTE — Progress Notes (Signed)
[facility] for Infectious Disease   CHIEF COMPLAINT    HIV follow up.  Last seen by me on 06/08/2020  SUBJECTIVE:    Curtis Trevino is a 55 y.o. male with PMHx as below who presents to the clinic for HIV follow up.   Nausea 2 weeks Vomiting 2 days BRBPR 2 dys  Inc nsaids  Please see A&P for the details of today's visit and status of the patient's medical problems.   Patient's Medications  New Prescriptions   No medications on file  Previous Medications   ASPIRIN-ACETAMINOPHEN-CAFFEINE (EXCEDRIN MIGRAINE) 250-250-65 MG TABLET    Take 1 tablet by mouth every 6 (six) hours as needed for headache.   BICTEGRAVIR-EMTRICITABINE-TENOFOVIR AF (BIKTARVY) 50-200-25 MG TABS TABLET    Take 1 tablet by mouth daily.   IBUPROFEN (ADVIL,MOTRIN) 200 MG TABLET    Take 400-600 mg by mouth every 8 (eight) hours as needed (for migraine headaches.).   Modified Medications   No medications on file  Discontinued Medications   No medications on file      Past Medical History:  Diagnosis Date  . Acid reflux   . Chronic back pain     Social History   Tobacco Use  . Smoking status: Former Smoker    Quit date: 11/15/2010    Years since quitting: 9.6  . Smokeless tobacco: Never Used  Substance Use Topics  . Alcohol use: Not Currently    Alcohol/week: 3.0 standard drinks    Types: 3 Shots of liquor per week    Comment: 3 drink a day  . Drug use: No    Family History  Problem Relation Age of Onset  . Diabetes Mother   . Heart failure Mother   . Cancer Mother   . Heart failure Father   . Cancer Father     Allergies  Allergen Reactions  . Tylenol [Acetaminophen] Nausea And Vomiting    Vomiting every time he takes tylenol  . Codeine Hives, Itching and Rash    Review of Systems  Constitutional: Negative.   Respiratory: Negative.   Cardiovascular: Negative.   Gastrointestinal: Positive for blood in stool, nausea and vomiting.  Genitourinary: Negative.   Skin:  Negative.      OBJECTIVE:    Vitals:   07/20/20 0858  BP: 111/75  Pulse: 92  Temp: 97.7 F (36.5 C)  TempSrc: Oral  SpO2: 96%  Weight: 184 lb (83.5 kg)  Height: 5\' 8"  (1.727 m)     Body mass index is 27.98 kg/m.  Physical Exam Constitutional:      General: He is not in acute distress.    Appearance: Normal appearance.  Pulmonary:     Effort: Pulmonary effort is normal. No respiratory distress.  Skin:    General: Skin is warm and dry.  Neurological:     General: No focal deficit present.     Mental Status: He is alert and oriented to person, place, and time.  Psychiatric:        Mood and Affect: Mood normal.        Behavior: Behavior normal.     Labs and Microbiology: CMP Latest Ref Rng & Units 04/21/2020 01/30/2020 11/15/2019  Glucose 65 - 99 mg/dL 106(H) 108(H) 115(H)  BUN 7 - 25 mg/dL 21 19 21(H)  Creatinine 0.70 - 1.33 mg/dL 1.49(H) 1.36(H) 1.21  Sodium 135 - 146 mmol/L 142 140 139  Potassium 3.5 - 5.3 mmol/L 3.9 4.6 4.1  Chloride 98 - 110 mmol/L 108 105 107  CO2 20 - 32 mmol/L 24 28 23   Calcium 8.6 - 10.3 mg/dL 9.6 9.6 9.6  Total Protein 6.1 - 8.1 g/dL 7.1 7.2 8.0  Total Bilirubin 0.2 - 1.2 mg/dL 0.4 0.4 0.4  Alkaline Phos 38 - 126 U/L - - 46  AST 10 - 35 U/L 19 21 23   ALT 9 - 46 U/L 17 19 29    CBC Latest Ref Rng & Units 04/21/2020 01/30/2020 11/15/2019  WBC 3.8 - 10.8 Thousand/uL 7.1 5.7 6.8  Hemoglobin 13.2 - 17.1 g/dL 14.5 14.0 15.2  Hematocrit 38.5 - 50.0 % 42.7 42.7 48.1  Platelets 140 - 400 Thousand/uL 285 279 218     Lab Results  Component Value Date   HIV1RNAQUANT <20 (H) 04/21/2020   HIV1RNAQUANT 852 (H) 01/30/2020   CD4TABS 819 04/21/2020   CD4TABS 696 01/30/2020    RPR and STI: Lab Results  Component Value Date   LABRPR REACTIVE (A) 06/08/2020   LABRPR REACTIVE (A) 04/21/2020   LABRPR REACTIVE (A) 01/30/2020   LABRPR REACTIVE (A) 06/18/2019   LABRPR REACTIVE (A) 06/13/2018   RPRTITER 1:8 (H) 06/08/2020   RPRTITER 1:4 (H) 04/21/2020    RPRTITER 1:4 (H) 01/30/2020   RPRTITER 1:4 (H) 06/18/2019   RPRTITER 1:8 (H) 06/13/2018    STI Results GC CT  06/08/2020 Negative Negative  06/08/2020 Negative Negative  06/08/2020 Negative Negative  02/16/2020 Negative Negative  02/16/2020 Negative Negative  01/30/2020 Negative Negative  06/18/2019 Negative Negative  06/18/2019 Positive(A) Negative  06/18/2019 Positive(A) Negative  06/13/2018 Negative Negative  06/13/2018 Negative Negative  06/13/2018 Negative Negative  03/18/2018 Negative Negative  03/18/2018 Negative Negative  03/18/2018 **POSITIVE**(A) **POSITIVE**(A)  09/18/2017 Negative Negative  09/18/2017 Negative Negative  09/18/2017 Negative Negative  08/23/2017 Negative Negative  08/23/2017 Negative Negative    Hepatitis B: Lab Results  Component Value Date   HEPBSAB NON-REACTIVE 01/30/2020   HEPBSAG NON-REACTIVE 01/30/2020   HEPBCAB NON-REACTIVE 01/30/2020   Hepatitis C: Lab Results  Component Value Date   HEPCAB NON-REACTIVE 01/30/2020   Hepatitis A: Lab Results  Component Value Date   HAV REACTIVE (A) 01/30/2020   Lipids: Lab Results  Component Value Date   CHOL 191 01/30/2020   TRIG 193 (H) 01/30/2020   HDL 42 01/30/2020   CHOLHDL 4.5 01/30/2020   LDLCALC 118 (H) 01/30/2020    Imaging: No recent imaging   ASSESSMENT & PLAN:    HIV disease ([place]) Patient currently on [place] and tolerating well thus far.  Most recent viral load in Jan 2022 was less than 20 copies and CD4 count 819 (45%).  Will check labs today and plan to continue [place] unless any issues.  RTC 3 months.  He did encounter some issues with insurance recently and we thought he may need to change medications but our pharmacy staff was able to get PA approved for [facility] and $0 co-pay.  He was advised today that he can pick up prescription from pharmacy  Syphilis Patient seen in March 2022 due to concern for STI exposure to syphilis.  Was treated with Bicillin x 1 at that time and RPR was found  to not be elevated 4-fold.  Will repeat RPR today to ensure stable.   High risk sexual behavior Patients urine, rectal, and oral GC/CT swabs were negative last month and he denies any new exposures or concerns.  Declines repeat STI screen today.   Headache Patient reports continued migraine headaches that have led to  an increase in NSAID use.  Discussed this may be contributing to GI symptoms he is experiencing and recommended limiting NSAID use and follow up with PCP to discuss further treatment options for his migraines.  He reports increased stress at home and work that may be triggering recent worsening of symptoms.  Therapeutic drug monitoring Creatinine in January 2022 was 1.49.  Will repeat labs today to ensure stability  Immunization counseling Due for Menveo booster today but not given due to acute issues.  Will recommend again at follow up as non-urgent.  Will also recommend Covid booster [employer] x 2 in Spring 2021) if not already done.  Check hepatitis serologies today s/p vaccination series completion in January 2022.  Rectal bleeding Presents today with primary complaint of nausea for 2 weeks followed recently by non-bloody, non-bilious emesis the past 2 days.  Also in the last 2 days he has noted bright red blood per rectum with bowel movements in the morning.  He reports previously having a colonoscopy with [facility] (Dr Michail Sermon) about 2 years ago and was told he had hemorrhoids.  He has no rectal pain and no melena or coffee-ground emesis.  He has been consuming significant amount of NSAIDs which increases risk for ulcer although BRBPR more c/w lower GI bleed (hemorrhoid vs diverticular).  He also reports recent GI illness with his partner and child a couple weeks back so this may be related to a gastroenteritis, however, the bleeding is concerning.  He is fortunately normotensive this morning and does not have any pre-syncopal symptoms so will check labs today to ensure  hemoglobin is stable and have patient urgently referred back to GI to consider endoscopic evaluation.  Also recommended that he reduce NSAID intake due to increased risk of bleeding and already mildly elevated creatinine.   Orders Placed This Encounter  Procedures  . Hepatitis B surface antibody,qualitative  . Hepatitis B surface antigen  . Hepatitis B core antibody, total  . COMPLETE METABOLIC PANEL WITH GFR  . CBC  . HIV-1 RNA quant-no reflex-bld  . RPR  . T-helper cell (CD4)- ([facility] clinic only)  . Ambulatory referral to Gastroenterology    Referral Priority:   Urgent    Referral Type:   Consultation    Referral Reason:   Specialty Services Required    Number of Visits Requested:   [facility] 07/20/2020, 9:51 AM  I spent 40 minutes dedicated to the care of this patient on the date of this encounter to include pre-visit review of records, face-to-face time with the patient discussing HIV, headaches, rectal bleeding and post-visit ordering of testing.

## 2020-07-20 NOTE — Patient Instructions (Signed)
Thank you for coming to see me today. It was a pleasure seeing you.  To Do: Marland Kitchen I have placed order for you to get seen again by Dr Michail Sermon given your complaints today . Try to limit your NSAIDs and follow up with Dr Rock Nephew regarding management of your migraines . Will also check labs . Continue Biktarvy . Follow up with me in 3 months  If you have any questions or concerns, please do not hesitate to call the office at [phone].  Take Care,   Jule Ser, DO

## 2020-07-20 NOTE — Assessment & Plan Note (Signed)
Patient reports continued migraine headaches that have led to an increase in NSAID use.  Discussed this may be contributing to GI symptoms he is experiencing and recommended limiting NSAID use and follow up with PCP to discuss further treatment options for his migraines.  He reports increased stress at home and work that may be triggering recent worsening of symptoms.

## 2020-07-20 NOTE — Assessment & Plan Note (Signed)
Patients urine, rectal, and oral GC/CT swabs were negative last month and he denies any new exposures or concerns.  Declines repeat STI screen today.

## 2020-07-20 NOTE — Assessment & Plan Note (Signed)
Due for Menveo booster today but not given due to acute issues.  Will recommend again at follow up as non-urgent.  Will also recommend Covid booster [employer] x 2 in Spring 2021) if not already done.  Check hepatitis serologies today s/p vaccination series completion in January 2022.

## 2020-07-20 NOTE — Assessment & Plan Note (Signed)
Creatinine in January 2022 was 1.49.  Will repeat labs today to ensure stability

## 2020-07-21 LAB — T-HELPER CELL (CD4) - (RCID CLINIC ONLY)
CD4 % Helper T Cell: 37 % (ref 33–65)
CD4 T Cell Abs: 864 /uL (ref 400–1790)

## 2020-07-23 LAB — COMPLETE METABOLIC PANEL WITH GFR
AG Ratio: 1.4 (calc) (ref 1.0–2.5)
ALT: 16 U/L (ref 9–46)
AST: 18 U/L (ref 10–35)
Albumin: 3.9 g/dL (ref 3.6–5.1)
Alkaline phosphatase (APISO): 39 U/L (ref 35–144)
BUN/Creatinine Ratio: 15 (calc) (ref 6–22)
BUN: 21 mg/dL (ref 7–25)
CO2: 25 mmol/L (ref 20–32)
Calcium: 9.2 mg/dL (ref 8.6–10.3)
Chloride: 106 mmol/L (ref 98–110)
Creat: 1.44 mg/dL — ABNORMAL HIGH (ref 0.70–1.33)
GFR, Est African American: 64 mL/min/{1.73_m2} (ref >=60)
GFR, Est Non African American: 55 mL/min/{1.73_m2} — ABNORMAL LOW (ref >=60)
Globulin: 2.8 g/dL (calc) (ref 1.9–3.7)
Glucose, Bld: 114 mg/dL — ABNORMAL HIGH (ref 65–99)
Potassium: 4.1 mmol/L (ref 3.5–5.3)
Sodium: 141 mmol/L (ref 135–146)
Total Bilirubin: 0.2 mg/dL (ref 0.2–1.2)
Total Protein: 6.7 g/dL (ref 6.1–8.1)

## 2020-07-23 LAB — CBC
HCT: 44.3 % (ref 38.5–50.0)
Hemoglobin: 14.5 g/dL (ref 13.2–17.1)
MCH: 27.8 pg (ref 27.0–33.0)
MCHC: 32.7 g/dL (ref 32.0–36.0)
MCV: 85 fL (ref 80.0–100.0)
MPV: 11 fL (ref 7.5–12.5)
Platelets: 286 10*3/uL (ref 140–400)
RBC: 5.21 10*6/uL (ref 4.20–5.80)
RDW: 14 % (ref 11.0–15.0)
WBC: 6.7 10*3/uL (ref 3.8–10.8)

## 2020-07-23 LAB — HIV-1 RNA QUANT-NO REFLEX-BLD
HIV 1 RNA Quant: <20 Copies/mL — ABNORMAL HIGH
HIV-1 RNA Quant, Log: <1.3 Log cps/mL — ABNORMAL HIGH

## 2020-07-23 LAB — RPR TITER: RPR Titer: 1:2 {titer} — ABNORMAL HIGH

## 2020-07-23 LAB — HEPATITIS B SURFACE ANTIBODY,QUALITATIVE: Hep B S Ab: REACTIVE — AB

## 2020-07-23 LAB — RPR: RPR Ser Ql: REACTIVE — AB

## 2020-07-23 LAB — HEPATITIS B SURFACE ANTIGEN: Hepatitis B Surface Ag: NONREACTIVE

## 2020-07-23 LAB — HEPATITIS B CORE ANTIBODY, TOTAL: Hep B Core Total Ab: NONREACTIVE

## 2020-07-23 LAB — FLUORESCENT TREPONEMAL AB(FTA)-IGG-BLD: Fluorescent Treponemal ABS: REACTIVE — AB

## 2020-07-26 ENCOUNTER — Telehealth: Payer: Self-pay

## 2020-07-26 NOTE — Telephone Encounter (Signed)
-----   Message from Mignon Pine, DO sent at 07/26/2020  4:19 PM EDT ----- Please let pt know that HIV RNA is <20 and other labs are stable.  Hgb was 14.5 which indicates not having significant bleeding but would still like for him to f/u with GI (Dr Michail Sermon) as discussed during our visit.  Thanks, Mitzi Hansen

## 2020-07-26 NOTE — Telephone Encounter (Signed)
Relayed test results to patient; viral load less than 20 and Hbg stable at 14.5. Patient to follow up with GI this week to schedule appointment.   Lacosta Hargan Lorita Officer, RN

## 2020-08-10 ENCOUNTER — Other Ambulatory Visit: Payer: Self-pay | Admitting: Internal Medicine

## 2020-08-12 NOTE — Telephone Encounter (Signed)
Sent 30 days/ 3 refills of Biktarvy and Left VM for patient to call providers office. Will need to schedule follow up in July with Juleen China. Also noted that to pharmacy with in refill notes.

## 2020-09-01 ENCOUNTER — Encounter: Payer: Self-pay | Admitting: Family Medicine

## 2020-09-01 ENCOUNTER — Other Ambulatory Visit: Payer: Self-pay

## 2020-09-01 ENCOUNTER — Ambulatory Visit (INDEPENDENT_AMBULATORY_CARE_PROVIDER_SITE_OTHER): Payer: BC Managed Care – PPO | Admitting: Family Medicine

## 2020-09-01 VITALS — BP 106/64 | HR 89 | Ht 68.0 in | Wt 181.2 lb

## 2020-09-01 DIAGNOSIS — G43809 Other migraine, not intractable, without status migrainosus: Secondary | ICD-10-CM

## 2020-09-01 DIAGNOSIS — G43909 Migraine, unspecified, not intractable, without status migrainosus: Secondary | ICD-10-CM | POA: Insufficient documentation

## 2020-09-01 MED ORDER — TOPIRAMATE 25 MG PO TABS: 25.0000 mg | ORAL_TABLET | Freq: Two times a day (BID) | ORAL | 0 refills | Status: DC

## 2020-09-01 NOTE — Progress Notes (Signed)
    SUBJECTIVE:   CHIEF COMPLAINT / HPI:   Migraines Patient reports a long history of migraines since he was a child. Over the past 9 months, his migraines have increased in frequency and severity. This worsening seems to coincide with when he started [facility] for his HIV. Patient reports 3-4 migraines per week. They are mostly right sided, rated 9/10 in severity, and will last ~2 days. They interfere with his daily function. He has been taking Excedrin and Ibuprofen 3-4 times per day over the past several months.  In the past, patient has tried Imitrex which he did not tolerate due to side effects. He also tried Topamax several years ago and did well (would get ~2 migraines/month).    PERTINENT  PMH / PSH: HIV, syphilis, prediabetes  OBJECTIVE:   BP 106/64   Pulse 89   Ht 5\' 8"  (1.727 m)   Wt 181 lb 3.2 oz (82.2 kg)   SpO2 97%   BMI 27.55 kg/m   General: NAD, pleasant, able to participate in exam Respiratory: normal effort Extremities: no edema or cyanosis. Skin: warm and dry Neuro: alert, no obvious focal deficits, normal gait Psych: Normal affect and mood, normal speech   ASSESSMENT/PLAN:   Migraine Migraines occurring 3-4x/week and interfering with daily functioning. His headaches are consistent with prior migraines, just increased in frequency. No indication for head imaging at this time. Patient would certainly be a candidate for prophylactic medication. Did well with Topamax in the past.  -Start Topamax 25mg  BID. Can titrate dose as needed. No known interaction with his Biktarvy. -Follow-up in 4 weeks. Recommend obtaining BMP at that time to monitor kidney function. -Recommended he stop Ibuprofen entirely and wean off Excedrin -Encouraged headache prevention strategies (hydration, adequate sleep, etc).    [name], MD [place]

## 2020-09-01 NOTE — Patient Instructions (Addendum)
It was great to meet you!  Things we discussed at today's visit: - For your migraines, we are going to start Topamax. This medication is safe to take along with Biktarvy. Start by taking 25mg  twice per day. We can increase the dose as needed in the future. - Stop taking Ibuprofen - Gradually taper down the Excedrin - Continue doing all the normal headache prevention things such as staying hydrating, getting 8 hrs of sleep, etc.  Take care and seek immediate care sooner if you develop any concerns.  Dr. [facility]

## 2020-09-01 NOTE — Assessment & Plan Note (Addendum)
Migraines occurring 3-4x/week and interfering with daily functioning. His headaches are consistent with prior migraines, just increased in frequency. No indication for head imaging at this time. Patient would certainly be a candidate for prophylactic medication. Did well with Topamax in the past.  -Start Topamax 25mg  BID. Can titrate dose as needed. No known interaction with his Biktarvy. -Follow-up in 4 weeks. Recommend obtaining BMP at that time to monitor kidney function. -Recommended he stop Ibuprofen entirely and wean off Excedrin -Encouraged headache prevention strategies (hydration, adequate sleep, etc).

## 2020-09-16 ENCOUNTER — Telehealth: Payer: Self-pay

## 2020-09-16 NOTE — Telephone Encounter (Signed)
Patient called complaining of back pain and some muscle pain for a few days. I asked patient if he has reached out to his pcp yet and patient stated he had not. He requested to see Dr. Juleen China today and I advised him Dr. Juleen China is not in the office today. I did advised patient to reach out to his pcp as well in regards to his pain.  Elizibeth Breau T Brooks Sailors

## 2020-09-17 ENCOUNTER — Other Ambulatory Visit: Payer: Self-pay

## 2020-09-20 ENCOUNTER — Ambulatory Visit (INDEPENDENT_AMBULATORY_CARE_PROVIDER_SITE_OTHER): Payer: BC Managed Care – PPO | Admitting: Family Medicine

## 2020-09-20 ENCOUNTER — Encounter: Payer: Self-pay | Admitting: Family Medicine

## 2020-09-20 ENCOUNTER — Other Ambulatory Visit: Payer: Self-pay

## 2020-09-20 ENCOUNTER — Ambulatory Visit: Payer: Self-pay

## 2020-09-20 VITALS — BP 110/78 | Ht 68.5 in | Wt 180.0 lb

## 2020-09-20 DIAGNOSIS — M545 Low back pain, unspecified: Secondary | ICD-10-CM | POA: Diagnosis not present

## 2020-09-20 DIAGNOSIS — M25561 Pain in right knee: Secondary | ICD-10-CM

## 2020-09-20 MED ORDER — MELOXICAM 15 MG PO TABS: 15.0000 mg | ORAL_TABLET | Freq: Every day | ORAL | 2 refills | Status: DC

## 2020-09-20 MED ORDER — METHYLPREDNISOLONE ACETATE 40 MG/ML IJ SUSP
40.0000 mg | Freq: Once | INTRAMUSCULAR | Status: AC
  Administered 2020-09-20: 40 mg via INTRA_ARTICULAR

## 2020-09-20 NOTE — Patient Instructions (Signed)
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 We aspirated and injected this today. We will go ahead with an MRI of your knee - there is something causing the pain and swelling leading to this cyst and you haven't improved with conservative measures to date. We will go ahead with MRIs of your knee and back. Follow up with me (in person or virtual) for a no charge visit to go over results. Drop off the paperwork for leave of absence and I will fill this out for 6 weeks.

## 2020-09-20 NOTE — Progress Notes (Signed)
SAMBA CUMBA - 55 y.o. male MRN [id number]  Date of birth: [birth year]  SUBJECTIVE:    CC: Right Knee and Low Back Pain  Fedor presents with complaints of right knee pain that has been ongoing for years but has flared up in the past couple of weeks. Does state that he now feels a lump in the back of the knee. His pain is all over the knee. He does have some catching and locking of the knee. It has been affecting his sleep. He works at [employer] and walks about 10 hours a day. He has had to take off work the past 5 days due to the pain. He was seen prior in 04/2019 with xrays being unremarkable, extensive rheumatologic workup also unremarkable. He tried at home exercises and NSAIDs without much relief.   He also has been having low back pain that has been ongoing for a while but has recently been bothering him more. The pain comes and goes. It is located both on the spine and around it. It has been keeping him out of work. The only thing that has helped is laying down. He has tried NSAIDs without much help. Does not want to take flexeril as that makes him drowsy and unable to do his daily tasks. He does not have any numbness and tingling. He did complain of some urinary symptoms. He reports that his stream is intermittent and that he sometimes needs to go without much warning. He also reports difficulty getting an erection but is still able to get erect. He does not report any bowel or urinary incontinence. He had an unremarkable lumbar spine xray in 2020.   Objective:  VS: BP:110/78  HR: bpm  TEMP: ( )  RESP:   HT:5' 8.5" (174 cm)   WT:180 lb (81.6 kg)  BMI:26.97 PHYSICAL EXAM:  General: Well appearing male in NAD. A&Ox3  Right Knee: Mild effusion of knee joint. No deformity or erythema.  Large palpable swelling medial aspect of popliteal fossa that is minimally TTP. Knee TTP at the medial and lateral joint line.  FROM in flexion and extension.  5/5 strength in flexion and extension.   Negative mcmurrays. Negative anterior drawer, valgus/varus testing.   Low Back: No deformity or erythema.  TTP at L4-L5.  Pain with flexion and extension of lumbar spine.  Neurovascularly intact distally.  5/5 strength in bilateral lower extremities.   Right Knee Limited Ultrasound: Popliteal space visualized with a large hypoechoic area with a small circular hyperechoic space without doppler flow inside of it consistent with a bakers cyst containing synovium. Suprapatellar space visualized with a small hypoechoic area consistent with a small effusion.   ASSESSMENT & PLAN:  Right Knee Pain: He has a large bakers cyst in the posterior portion of his knee that is most likely causing a good portion of his discomfort. We will drain and inject as below. He also has signs of osteoarthritis of the joint on exam. Previous xrays have been unremarkable and conservative treatment has not helped. We will proceed with an MRI to identify underlying cause of pain. We will prescribe meloxicam to help with inflammation. If this does not help we can discuss oral diclofenac. He is also in significant pain and will need time off work. He will drop of the paperwork for a leave of absence. We have discussed a timeline and feel that 6 weeks leave is appropriate. We will follow up to discuss MRI results.  After informed written consent timeout was  performed, patient was lying prone on exam table. Right knee was prepped overlying bakers cyst with alcohol swab.  14mL lidocaine used for local anesthesia.  Then utilizing ultrasound guidance, 57mL of yellow fluid aspirated from right knee baker's cyst followed by injection with 3:1 lidocaine: depomedrol. Patient tolerated the procedure well without immediate complications.  Low Back Pain: His low back pain is most likely degenerative in nature. He does not have any true red flag symptoms as his urinary symptoms seem related to prostate issues. He has had unremarkable xrays  in the past and does not seem to be improving with conservative treatment including home exercises and NSAIDs. We will get an MRI of his low back to assess. The meloxicam prescribed for his knee should help with this pain as well. We will follow up to discuss MRI results.    Marcelino Duster, MS4

## 2020-09-25 ENCOUNTER — Ambulatory Visit
Admission: RE | Admit: 2020-09-25 | Discharge: 2020-09-25 | Disposition: A | Discharge status: Home / Self Care | Payer: BC Managed Care – PPO | Source: Ambulatory Visit | Attending: Family Medicine | Admitting: Family Medicine

## 2020-09-25 DIAGNOSIS — M25561 Pain in right knee: Secondary | ICD-10-CM

## 2020-09-25 DIAGNOSIS — M545 Low back pain, unspecified: Secondary | ICD-10-CM

## 2020-09-30 ENCOUNTER — Other Ambulatory Visit: Payer: Self-pay

## 2020-09-30 DIAGNOSIS — M545 Low back pain, unspecified: Secondary | ICD-10-CM

## 2020-09-30 NOTE — Addendum Note (Signed)
Addended by: Jolinda Croak E on: 09/30/2020 11:56 AM   Modules accepted: Orders

## 2020-09-30 NOTE — Addendum Note (Signed)
Addended by: Jolinda Croak E on: 09/30/2020 11:49 AM   Modules accepted: Orders

## 2020-10-04 ENCOUNTER — Ambulatory Visit (INDEPENDENT_AMBULATORY_CARE_PROVIDER_SITE_OTHER): Payer: BC Managed Care – PPO | Admitting: Family Medicine

## 2020-10-04 ENCOUNTER — Other Ambulatory Visit: Payer: Self-pay

## 2020-10-04 VITALS — BP 90/58 | Ht 68.5 in

## 2020-10-04 DIAGNOSIS — M25561 Pain in right knee: Secondary | ICD-10-CM

## 2020-10-04 MED ORDER — METHYLPREDNISOLONE ACETATE 40 MG/ML IJ SUSP
40.0000 mg | Freq: Once | INTRAMUSCULAR | Status: AC
  Administered 2020-10-04: 40 mg via INTRA_ARTICULAR

## 2020-10-04 NOTE — Progress Notes (Signed)
Patient returned today for right knee injection.  Getting his ESI tomorrow.  After informed written consent timeout was performed, patient was seated on exam table. Right knee was prepped with alcohol swab and utilizing anteromedial approach, patient's right knee was injected intraarticularly with 3:1 lidocaine: depomedrol. Patient tolerated the procedure well without immediate complications.

## 2020-10-05 ENCOUNTER — Ambulatory Visit
Admission: RE | Admit: 2020-10-05 | Discharge: 2020-10-05 | Disposition: A | Discharge status: Home / Self Care | Payer: BC Managed Care – PPO | Source: Ambulatory Visit | Attending: Family Medicine | Admitting: Family Medicine

## 2020-10-05 DIAGNOSIS — M545 Low back pain, unspecified: Secondary | ICD-10-CM

## 2020-10-05 MED ORDER — METHYLPREDNISOLONE ACETATE 40 MG/ML INJ SUSP (RADIOLOG
80.0000 mg | Freq: Once | INTRAMUSCULAR | Status: AC
  Administered 2020-10-05: 80 mg via EPIDURAL

## 2020-10-05 MED ORDER — IOPAMIDOL (ISOVUE-M 200) INJECTION 41%
1.0000 mL | Freq: Once | INTRAMUSCULAR | Status: AC
  Administered 2020-10-05: 1 mL via EPIDURAL

## 2020-10-05 NOTE — Discharge Instructions (Signed)

## 2020-10-13 ENCOUNTER — Encounter: Payer: Self-pay | Admitting: Family Medicine

## 2020-10-13 ENCOUNTER — Ambulatory Visit: Payer: BC Managed Care – PPO | Admitting: Family Medicine

## 2020-10-13 ENCOUNTER — Other Ambulatory Visit: Payer: Self-pay

## 2020-10-13 VITALS — BP 105/72 | HR 103 | Ht 68.5 in | Wt 179.4 lb

## 2020-10-13 DIAGNOSIS — N1831 Chronic kidney disease, stage 3a: Secondary | ICD-10-CM | POA: Diagnosis not present

## 2020-10-13 DIAGNOSIS — G43809 Other migraine, not intractable, without status migrainosus: Secondary | ICD-10-CM | POA: Diagnosis not present

## 2020-10-13 MED ORDER — TOPIRAMATE 25 MG PO TABS: 25.0000 mg | ORAL_TABLET | Freq: Two times a day (BID) | ORAL | 3 refills | Status: DC

## 2020-10-13 NOTE — Assessment & Plan Note (Signed)
Significantly improved with Topamax 25mg  BID. Doing well, no adverse effects. Patient no longer requiring Excedrin/Ibuprofen on a regular basis. Will check BMP today. Continue Topamax 25mg  BID

## 2020-10-13 NOTE — Patient Instructions (Addendum)
It was great to see you!  Things we discussed at today's visit: - Continue taking the Topamax 25mg  twice daily. If any dose adjustments are needed, I will call you in the next few days - You can continue using Flonase and Allegra daily as needed - Please call [facility] to schedule your colonoscopy  Take care and seek immediate care sooner if you develop any concerns.  Dr. [facility]

## 2020-10-13 NOTE — Progress Notes (Signed)
    SUBJECTIVE:   CHIEF COMPLAINT / HPI:   Migraine Follow-Up Started on Topamax 25mg  BID at last visit (09/01/2020). Previously having migraines 3-4x/week, interfering with daily functioning. Reports he is doing much better since starting the Topamax. Has only had 2 migraines over the past 1.5 months. Has not needed to take Ibuprofen or Excedrin in 2 weeks. Has not noticed any adverse effects with the Topamax.  Ear Pressure Patient sick with URI symptoms 2 weeks ago. Had cough, congestion that lasted 1.5 weeks. His kids were also sick at the time with colds. Both his kids required treatment with antibiotics for ear infections. Patient reports his symptoms have resolved but he has some lingering discomfort in bilateral ears. Taking Allegra D and Flonase which he finds helpful.  PERTINENT  PMH / PSH: HIV, syphilis, prediabetes  OBJECTIVE:   BP 105/72   Pulse (!) 103   Ht 5' 8.5" (1.74 m)   Wt 179 lb 6.4 oz (81.4 kg)   SpO2 95%   BMI 26.88 kg/m   General: NAD, pleasant, able to participate in exam HEENT: nares patent, oropharynx normal without edema or erythema, TM clear bilaterally Respiratory: No respiratory distress CV: RRR, normal S1/S2 Psych: Normal affect and mood Neuro: grossly intact   ASSESSMENT/PLAN:   Migraine Significantly improved with Topamax 25mg  BID. Doing well, no adverse effects. Patient no longer requiring Excedrin/Ibuprofen on a regular basis. Will check BMP today. Continue Topamax 25mg  BID   Ear Pressure Likely related to his recent URI. Ears are normal bilaterally on exam. Continue Flonase and Allegra. Anticipate resolution over the next week or so.  Health Maintenance Due for colonoscopy. Previously seen by [facility] but states he owes them money. Advised patient to call and schedule colonoscopy. He was agreeable.  [name], MD [place]

## 2020-10-14 LAB — BASIC METABOLIC PANEL
BUN/Creatinine Ratio: 14 (ref 9–20)
BUN: 22 mg/dL (ref 6–24)
CO2: 24 mmol/L (ref 20–29)
Calcium: 9.4 mg/dL (ref 8.7–10.2)
Chloride: 102 mmol/L (ref 96–106)
Creatinine, Ser: 1.54 mg/dL — ABNORMAL HIGH (ref 0.76–1.27)
Glucose: 108 mg/dL — ABNORMAL HIGH (ref 65–99)
Potassium: 4.7 mmol/L (ref 3.5–5.2)
Sodium: 140 mmol/L (ref 134–144)
eGFR: 54 mL/min/{1.73_m2} — ABNORMAL LOW (ref >59)

## 2020-10-25 ENCOUNTER — Other Ambulatory Visit: Payer: Self-pay

## 2020-10-25 ENCOUNTER — Ambulatory Visit (INDEPENDENT_AMBULATORY_CARE_PROVIDER_SITE_OTHER): Payer: BC Managed Care – PPO | Admitting: Family Medicine

## 2020-10-25 ENCOUNTER — Encounter: Payer: Self-pay | Admitting: Family Medicine

## 2020-10-25 VITALS — Ht 69.0 in | Wt 170.0 lb

## 2020-10-25 DIAGNOSIS — M545 Low back pain, unspecified: Secondary | ICD-10-CM | POA: Diagnosis not present

## 2020-10-25 NOTE — Patient Instructions (Addendum)
We will set you up for a second injection for your back. Out of work an additional 4 weeks. Start physical therapy and do home exercises on days you don't go to therapy. We will check on the MRI with and without contrast of your back also. Follow up with me in about 1 month.   MRI authorization number is [id number]

## 2020-10-25 NOTE — Progress Notes (Signed)
   Curtis Trevino is a 55 y.o. male who presents to [facility] today for the following:  Left back pain: On 7/12 patient states that he could barely walk which continued for the next 2 days.  Took meloxicam at that time with improvement.  Had been doing well since his epidural injection on 6/28 on right at L3-4 level.  Does not know of any injury to cause this pain, does note though that he was cleaning.  Did well with rest over the weekend and is now wondering if he needs to have a second injection.  Also due to go back to work this week and is unsure that he will be able to do that as it requires him to work 10 hours on his feet.  Left thigh pain: Patient reports that he has been having left upper thigh pain when he is getting into his truck and then when going upstairs.  Describes it as a sharp shooting pain that goes from the "inside out".  Does not have any pain at rest.   PMH reviewed.  ROS as above. Medications reviewed.  Exam:  Ht 5\' 9"  (1.753 m)   Wt 170 lb (77.1 kg)   BMI 25.10 kg/m  Gen: Well NAD MSK:  Lower back: No gross deformity. No tenderness to palpation. Negative straight leg raise.  Left hip/knee: Inspection without gross deformity or swelling Nontender to palpation at rest ROM: Pain with hip flexion against resistance Negative logroll test Strength: 4/5 strength for left hip flexion, 5/5 for the right hip   DG INJECT DIAG/THERA/INC NEEDLE/CATH/PLC EPI/LUMB/SAC W/IMG  Result Date: 10/05/2020 CLINICAL DATA:  Lumbosacral spondylosis without myelopathy. Central low back and right thigh pain. No prior injections or surgery. FLUOROSCOPY TIME:  Radiation Exposure Index (as provided by the fluoroscopic device): 2.8 mGy Fluoroscopy Time:  15 seconds Number of Acquired Images:  0 PROCEDURE: The procedure, risks, benefits, and alternatives were explained to the patient. Questions regarding the procedure were encouraged and answered. The patient understands and consents to the  procedure. LUMBAR EPIDURAL INJECTION: An interlaminar approach was performed on the right at L3-L4. The overlying skin was cleansed and anesthetized. A 3.5 inch 20 gauge epidural needle was advanced using loss-of-resistance technique. DIAGNOSTIC EPIDURAL INJECTION: Injection of Isovue-M 200 shows a good epidural pattern with spread above and below the level of needle placement, primarily on the right. No vascular opacification is seen. THERAPEUTIC EPIDURAL INJECTION: 80 mg of Depo-Medrol mixed with 3 mL of 1% lidocaine were instilled. The procedure was well-tolerated, and the patient was discharged thirty minutes following the injection in good condition. COMPLICATIONS: None immediate. IMPRESSION: Technically successful interlaminar epidural injection on the right at L3-L4. Electronically Signed   By: Titus Dubin M.D.   On: 10/05/2020 14:20     Assessment and Plan: Lumbar back pain.  Has since improved, likely related to bulging disc.  Do feel at this time patient would benefit from formal physical therapy as well as possibly a second epidural shot.  Recommend extending his work absence for the next 4 weeks.  We will follow-up on timing of the repeat MRI with contrast for further evaluation for the nerve tumor noted on prior MRI.  Follow-up in 4 weeks. Left thigh pain.  Likely small strain/weakness of the left hip flexors. Patient already recommended to go to formal physical therapy.  Ruthann Angulo, DO

## 2020-10-27 ENCOUNTER — Ambulatory Visit (INDEPENDENT_AMBULATORY_CARE_PROVIDER_SITE_OTHER): Payer: BC Managed Care – PPO | Admitting: Internal Medicine

## 2020-10-27 ENCOUNTER — Other Ambulatory Visit: Payer: Self-pay

## 2020-10-27 ENCOUNTER — Encounter: Payer: Self-pay | Admitting: Internal Medicine

## 2020-10-27 ENCOUNTER — Other Ambulatory Visit (HOSPITAL_COMMUNITY)
Admission: RE | Admit: 2020-10-27 | Discharge: 2020-10-27 | Disposition: A | Discharge status: Home / Self Care | Payer: BC Managed Care – PPO | Source: Ambulatory Visit | Attending: Internal Medicine | Admitting: Internal Medicine

## 2020-10-27 ENCOUNTER — Other Ambulatory Visit (HOSPITAL_COMMUNITY): Payer: Self-pay

## 2020-10-27 VITALS — BP 98/65 | HR 94 | Temp 98.0°F | Wt 180.0 lb

## 2020-10-27 DIAGNOSIS — Z5181 Encounter for therapeutic drug level monitoring: Secondary | ICD-10-CM

## 2020-10-27 DIAGNOSIS — A539 Syphilis, unspecified: Secondary | ICD-10-CM | POA: Diagnosis not present

## 2020-10-27 DIAGNOSIS — Z7251 High risk heterosexual behavior: Secondary | ICD-10-CM

## 2020-10-27 DIAGNOSIS — Z113 Encounter for screening for infections with a predominantly sexual mode of transmission: Secondary | ICD-10-CM | POA: Insufficient documentation

## 2020-10-27 DIAGNOSIS — B20 Human immunodeficiency virus [HIV] disease: Secondary | ICD-10-CM

## 2020-10-27 MED ORDER — DOVATO 50-300 MG PO TABS
1.0000 | ORAL_TABLET | Freq: Every day | ORAL | 5 refills | Status: DC
Start: 2020-10-27 — End: 2020-12-14

## 2020-10-27 NOTE — Assessment & Plan Note (Addendum)
Creatinine was stable earlier this month.  Will not repeat again today.

## 2020-10-27 NOTE — Patient Instructions (Signed)
Thank you for coming to see me today. It was a pleasure seeing you.  To Do: Swabs and urine today.  If positive, I will call you back to get treated Blood work today Stop taking [place] and Start taking Dovato  Follow up with your other providers for back pain and migraines See me in 6 weeks  If you have any questions or concerns, please do not hesitate to call the office at [phone].  Take Care,   Jule Ser, DO

## 2020-10-27 NOTE — Assessment & Plan Note (Signed)
Will check urine, oral, and rectal GC/CT.  Offered treatment but he prefers to wait on test results.

## 2020-10-27 NOTE — Assessment & Plan Note (Signed)
Doing well on Biktarvy and recent VL <20.  He is interested in switching to Dovato so will do so today.  He is immune to hepatitis B and has no predicted resistance.  Dovato sent to pharmacy and will check labs today and again in 6 weeks after switch.

## 2020-10-27 NOTE — Assessment & Plan Note (Signed)
Repeat RPR today.  No new concerns.

## 2020-10-27 NOTE — Progress Notes (Addendum)
[facility]   CHIEF COMPLAINT    HIV follow up.   SUBJECTIVE:    Curtis Trevino is a 55 y.o. male with PMHx as below who presents to the clinic for HIV follow up.   Previously seen by me on 07/20/20.  In the interim has been seeing his PCP for migraines and sports medicine for back pain.  Received a steroid injection in his back for this in June.  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  Also had a bulging disc at L3-4 which is where he had the injection. Care is being coordinated via Dr Ericka Pontiff office for repeat MRI w/ contrast to further evaluate nerve root tumor.  Most recent HIV labs in April: VL <20, RPR 1:2, CMP stable, CD4 864.  He remains on Biktarvy.  Partner recently tested positive for rectal GC and he would like testing.  He has no symptoms.  He is also interested in switching his meds to Dovato.   Please see A&P for the details of today's visit and status of the patient's medical problems.   Patient's Medications  New Prescriptions   DOLUTEGRAVIR-LAMIVUDINE (DOVATO) 50-300 MG TABS    Take 1 tablet by mouth daily.  Previous Medications   MELOXICAM (MOBIC) 15 MG TABLET    Take 1 tablet (15 mg total) by mouth daily.   TOPIRAMATE (TOPAMAX) 25 MG TABLET    Take 1 tablet (25 mg total) by mouth 2 (two) times daily.  Modified Medications   No medications on file  Discontinued Medications   BIKTARVY 50-200-25 MG TABS TABLET    Take 1 tablet by mouth once daily      Past Medical History:  Diagnosis Date   Acid reflux    Chronic back pain     Social History   Tobacco Use   Smoking status: Former    Types: Cigarettes    Quit date: 11/15/2010    Years since quitting: 9.9   Smokeless tobacco: Never  Substance Use Topics   Alcohol use: Not Currently    Alcohol/week: 3.0 standard drinks    Types: 3 Shots of liquor per week    Comment: 3 drink a day   Drug use: No    Family History  Problem Relation  Age of Onset   Diabetes Mother    Heart failure Mother    Cancer Mother    Heart failure Father    Cancer Father     Allergies  Allergen Reactions   Codeine Hives, Itching and Rash   Tylenol [Acetaminophen] Nausea And Vomiting    Vomiting every time he takes tylenol    Review of Systems  Constitutional: Negative.   Respiratory: Negative.    Cardiovascular: Negative.   Gastrointestinal: Negative.   Genitourinary: Negative.   Musculoskeletal:  Positive for back pain.    OBJECTIVE:    Vitals:   10/27/20 0851 10/27/20 0853  BP:  98/65  Pulse:  94  Temp: 98 F (36.7 C) 98 F (36.7 C)  TempSrc: Oral Oral  Weight: 180 lb (81.6 kg) 180 lb (81.6 kg)     Body mass index is 26.58 kg/m.  Physical Exam Constitutional:      General: He is not in acute distress.    Appearance: Normal appearance.  HENT:     Head: Normocephalic and atraumatic.  Pulmonary:     Effort: Pulmonary effort is normal. No respiratory distress.  Skin:  General: Skin is warm and dry.     Findings: No rash.  Neurological:     General: No focal deficit present.     Mental Status: He is alert and oriented to person, place, and time.  Psychiatric:        Mood and Affect: Mood normal.        Behavior: Behavior normal.    Labs and Microbiology: CMP Latest Ref Rng & Units 10/13/2020 07/20/2020 04/21/2020  Glucose 65 - 99 mg/dL 108(H) 114(H) 106(H)  BUN 6 - 24 mg/dL 22 21 21   Creatinine 0.76 - 1.27 mg/dL 1.54(H) 1.44(H) 1.49(H)  Sodium 134 - 144 mmol/L 140 141 142  Potassium 3.5 - 5.2 mmol/L 4.7 4.1 3.9  Chloride 96 - 106 mmol/L 102 106 108  CO2 20 - 29 mmol/L 24 25 24   Calcium 8.7 - 10.2 mg/dL 9.4 9.2 9.6  Total Protein 6.1 - 8.1 g/dL - 6.7 7.1  Total Bilirubin 0.2 - 1.2 mg/dL - 0.2 0.4  Alkaline Phos 38 - 126 U/L - - -  AST 10 - 35 U/L - 18 19  ALT 9 - 46 U/L - 16 17   CBC Latest Ref Rng & Units 07/20/2020 04/21/2020 01/30/2020  WBC 3.8 - 10.8 Thousand/uL 6.7 7.1 5.7  Hemoglobin 13.2 - 17.1  g/dL 14.5 14.5 14.0  Hematocrit 38.5 - 50.0 % 44.3 42.7 42.7  Platelets 140 - 400 Thousand/uL 286 285 279     Lab Results  Component Value Date   HIV1RNAQUANT <20 (H) 07/20/2020   HIV1RNAQUANT <20 (H) 04/21/2020   HIV1RNAQUANT 852 (H) 01/30/2020   CD4TABS 864 07/20/2020   CD4TABS 819 04/21/2020   CD4TABS 696 01/30/2020    RPR and STI: Lab Results  Component Value Date   LABRPR REACTIVE (A) 07/20/2020   LABRPR REACTIVE (A) 06/08/2020   LABRPR REACTIVE (A) 04/21/2020   LABRPR REACTIVE (A) 01/30/2020   LABRPR REACTIVE (A) 06/18/2019   RPRTITER 1:2 (H) 07/20/2020   RPRTITER 1:8 (H) 06/08/2020   RPRTITER 1:4 (H) 04/21/2020   RPRTITER 1:4 (H) 01/30/2020   RPRTITER 1:4 (H) 06/18/2019    STI Results GC CT  06/08/2020 Negative Negative  06/08/2020 Negative Negative  06/08/2020 Negative Negative  02/16/2020 Negative Negative  02/16/2020 Negative Negative  01/30/2020 Negative Negative  06/18/2019 Negative Negative  06/18/2019 Positive(A) Negative  06/18/2019 Positive(A) Negative  06/13/2018 Negative Negative  06/13/2018 Negative Negative  06/13/2018 Negative Negative  03/18/2018 Negative Negative  03/18/2018 Negative Negative  03/18/2018 **POSITIVE**(A) **POSITIVE**(A)  09/18/2017 Negative Negative  09/18/2017 Negative Negative  09/18/2017 Negative Negative  08/23/2017 Negative Negative  08/23/2017 Negative Negative    Hepatitis B: Lab Results  Component Value Date   HEPBSAB REACTIVE (A) 07/20/2020   HEPBSAG NON-REACTIVE 07/20/2020   HEPBCAB NON-REACTIVE 07/20/2020   Hepatitis C: Lab Results  Component Value Date   HEPCAB NON-REACTIVE 01/30/2020   Hepatitis A: Lab Results  Component Value Date   HAV REACTIVE (A) 01/30/2020   Lipids: Lab Results  Component Value Date   CHOL 191 01/30/2020   TRIG 193 (H) 01/30/2020   HDL 42 01/30/2020   CHOLHDL 4.5 01/30/2020   LDLCALC 118 (H) 01/30/2020      ASSESSMENT & PLAN:    HIV disease ([place]) Doing well on Biktarvy and recent VL  <20.  He is interested in switching to Dovato so will do so today.  He is immune to hepatitis B and has no predicted resistance.  Dovato sent to pharmacy and will check labs  today and again in 6 weeks after switch.   Syphilis Repeat RPR today.  No new concerns.   High risk sexual behavior Will check urine, oral, and rectal GC/CT.  Offered treatment but he prefers to wait on test results.   Therapeutic drug monitoring Creatinine was stable earlier this month.  Will not repeat again today.    Orders Placed This Encounter  Procedures   HIV-1 RNA quant-no reflex-bld   RPR       Raynelle [facility] 10/27/2020, 9:57 AM

## 2020-10-28 ENCOUNTER — Telehealth: Payer: Self-pay

## 2020-10-28 LAB — URINE CYTOLOGY ANCILLARY ONLY
Chlamydia: NEGATIVE
Comment: NEGATIVE
Comment: NORMAL
Neisseria Gonorrhea: NEGATIVE

## 2020-10-28 LAB — CYTOLOGY, (ORAL, ANAL, URETHRAL) ANCILLARY ONLY
Chlamydia: NEGATIVE
Chlamydia: POSITIVE — AB
Comment: NEGATIVE
Comment: NEGATIVE
Comment: NORMAL
Comment: NORMAL
Neisseria Gonorrhea: NEGATIVE
Neisseria Gonorrhea: NEGATIVE

## 2020-10-28 MED ORDER — DOXYCYCLINE HYCLATE 100 MG PO TABS
100.0000 mg | ORAL_TABLET | Freq: Two times a day (BID) | ORAL | 0 refills | Status: AC
Start: 2020-10-28 — End: 2020-11-04

## 2020-10-28 NOTE — Addendum Note (Signed)
Addended by: Mignon Pine on: 10/28/2020 04:01 PM   Modules accepted: Orders

## 2020-10-28 NOTE — Telephone Encounter (Signed)
Left HIPAA compliant voicemail requesting callback.   Cynda Soule D Jala Dundon, RN  

## 2020-10-28 NOTE — Telephone Encounter (Signed)
-----   Message from Mignon Pine, DO sent at 10/28/2020  4:04 PM EDT ----- Can you please let patient know his rectal chlamydia test was positive.  Other testing for gonorrhea was negative and RPR titer is stable at 1:2.  His chlamydia infection needs treatment and I sent in doxycycline 100mg  BID x 7 days to his pharmacy.  Partners should be contacted/treated and he should refrain from sex until after completing treatment.  Thanks.

## 2020-10-29 LAB — RPR: RPR Ser Ql: REACTIVE — AB

## 2020-10-29 LAB — FLUORESCENT TREPONEMAL AB(FTA)-IGG-BLD: Fluorescent Treponemal ABS: REACTIVE — AB

## 2020-10-29 LAB — HIV-1 RNA QUANT-NO REFLEX-BLD
HIV 1 RNA Quant: NOT DETECTED Copies/mL
HIV-1 RNA Quant, Log: NOT DETECTED Log cps/mL

## 2020-10-29 LAB — RPR TITER: RPR Titer: 1:2 {titer} — ABNORMAL HIGH

## 2020-10-29 NOTE — Telephone Encounter (Signed)
Patient advised of lab results. Patient states he has notified his partner already. Patient given instructions on how to take doxycycline and to refrain from sex until after completing treatment and additional 7-10 days. Curtis Trevino

## 2020-11-01 ENCOUNTER — Telehealth: Payer: Self-pay

## 2020-11-01 NOTE — Telephone Encounter (Signed)
Called patient to relay that his viral load is undetectable. He is wondering if switching to Dovato will cause any change in his viral suppression. Advised him that as long as he takes it as prescribed he should not see a difference, but that his viral load will continue to be monitored by Dr. Juleen China at future appointments. Assured him that any changes in viral load will be addressed by the provider. Patient verbalized understanding and has no further questions.   Beryle Flock, RN

## 2020-11-01 NOTE — Telephone Encounter (Signed)
-----   Message from Mignon Pine, DO sent at 10/30/2020  9:28 PM EDT ----- Please let patient know that the rest of his labs look stable.  In particular his HIV viral load is undetectable.   Thanks, Mitzi Hansen

## 2020-11-09 ENCOUNTER — Other Ambulatory Visit: Payer: Self-pay

## 2020-11-09 ENCOUNTER — Ambulatory Visit: Payer: BC Managed Care – PPO | Attending: Family Medicine

## 2020-11-09 DIAGNOSIS — M25562 Pain in left knee: Secondary | ICD-10-CM | POA: Insufficient documentation

## 2020-11-09 DIAGNOSIS — M25561 Pain in right knee: Secondary | ICD-10-CM | POA: Insufficient documentation

## 2020-11-09 DIAGNOSIS — G8929 Other chronic pain: Secondary | ICD-10-CM | POA: Diagnosis present

## 2020-11-09 DIAGNOSIS — M545 Low back pain, unspecified: Secondary | ICD-10-CM | POA: Insufficient documentation

## 2020-11-09 NOTE — Patient Instructions (Signed)
HOME EXERCISE PROGRAM [id number]  STANDING TRACTION -  Repeat 1 Time, Hold 1 Minute, Complete 5 Sets, Perform 1 Times a Day  PRONE FLEXED TRACTION - MODERATE FORCE -  Repeat 1 Time, Hold 5 Minutes, Complete 1 Set, Perform 1  Lumbar spine traction w/chair -  Hold 15 Minutes, Perform 2 Times a Day  POSTERIOR PELVIC TILT -  Repeat 15 Times, Hold 3 Seconds, Perform 1 Times a Day

## 2020-11-09 NOTE — Therapy (Addendum)
Curtis Trevino, [place], [address] Phone: [phone]   Fax:  [phone]  Physical Therapy Evaluation  Patient Details  Name: Curtis Trevino MRN: [id number] Date of Birth: [birth year] Referring Provider (PT): [name], MD   Encounter Date: 11/09/2020   PT End of Session - 11/09/20 1235     Visit Number 1    Number of Visits 12    Date for PT Re-Evaluation 12/21/20    Authorization Type BCBS    PT Start Time 1147    PT Stop Time 1233    PT Time Calculation (min) 46 min    Activity Tolerance Patient tolerated treatment well    Behavior During Therapy [facility] for tasks assessed/performed             Past Medical History:  Diagnosis Date   Acid reflux    Chronic back pain     Past Surgical History:  Procedure Laterality Date   CHOLECYSTECTOMY N/A 11/18/2013   Procedure: LAPAROSCOPIC CHOLECYSTECTOMY;  Surgeon: Scherry Ran, MD;  Location: [facility];  Service: General;  Laterality: N/A;   ESOPHAGOGASTRODUODENOSCOPY  2005   SCROTAL EXPLORATION Left 06/23/2016   Procedure: LEFT SCROTAL EXPLORATION WITH EXCISION OF EPIDIDYMAL MASS;  Surgeon: Irine Seal, MD;  Location: [facility];  Service: Urology;  Laterality: Left;    There were no vitals filed for this visit.    Subjective Assessment - 11/09/20 1150     Subjective The patient reports that he has been having back pain for about 10 years.  He has had a few bad episodes that he has had to take pain medication to get through a few bad days.  The patient reports that they recently did an x-ray and then a MRI.  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  Curtis Trevino reports that three weeks ago he was having a constant pain and they ordered a MRI.  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  So his knee pain is always there, he is not sure if he is getting pain from the back into the legs due to that.  He  reports a new symptoms of numbness and tingling in the right toes that is intermittent.  The patient reports that he has a 53 year old, 53 year old and 53 year old.  The patient reports pain aggravation with picking up his 53 year old.  Holding him and twisting can cause 2-3 days of pain increase.  Sitting on a hard bench for prolong time peroid can aggravate it.  He does have monring stiffness.    Limitations Lifting;House hold activities;Other (comment)   twisting   Diagnostic tests MRI Lumbar:IMPRESSION:  1. Just below the L1-2 disc space there is a small intrathecal  lesion measuring a maximum of 7 mm. 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 Recommend follow-up MRI lumbar spine with contrast for  further evaluation.  2. Diffuse bulging annulus and shallow right foraminal disc  protrusion at L3-4 slightly displacing the right L3 nerve root.  Recommend correlation with right L3 radicular symptoms.    Patient Stated Goals to have less pain and be able to pick up his  year old.    Currently in Pain? No/denies   At worse in last week 5/10 with prolong sitting on a hard bench   Pain Location Back    Pain Orientation Mid;Lower  Pain Descriptors / Indicators Throbbing    Aggravating Factors  lifting, twisting, prolong sitting, sudden movements, mornings    Pain Relieving Factors position change, walking, stretches,    Effect of Pain on Daily Activities Limited with lifting his son.                [facility] PT Assessment - 11/09/20 0001       Assessment   Medical Diagnosis M54.50 (ICD-10-CM) - Acute bilateral low back pain without sciatica    Referring Provider (PT) Barbaraann Barthel, Sharyn Lull, MD    Onset Date/Surgical Date --   gradual onset with aggravation about 3 weeks ago.   Hand Dominance Right    Prior Therapy none      Precautions   Precautions None      Restrictions   Weight Bearing Restrictions No      Balance Screen   Has the patient fallen in the past 6 months No    Has the patient had a  decrease in activity level because of a fear of falling?  No    Is the patient reluctant to leave their home because of a fear of falling?  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   Leisure play with sons, go to park,      Cognition   Overall Cognitive Status Within Functional Limits for tasks assessed      Observation/Other Assessments   Focus on Therapeutic Outcomes (FOTO)  37% Predicted 54%      Deep Tendon Reflexes   DTR Assessment Site Patella;Achilles    Patella DTR 2+    Achilles DTR 2+      ROM / Strength   AROM / PROM / Strength AROM;Strength      AROM   Lumbar Flexion 75% shift to the left    Lumbar Extension 50% with pain increase    Lumbar - Right Side Bend to knee    Lumbar - Left Side Bend to knee    Lumbar - Right Rotation 50%    Lumbar - Left Rotation 50%      Strength   Right Hip Flexion 5/5    Right Hip Extension 4+/5    Right Hip External Rotation  5/5    Right Hip Internal Rotation 4/5    Left Hip Flexion 5/5    Left Hip External Rotation 5/5    Left Hip Internal Rotation 4/5    Right/Left Knee --   [facility] bilaterally     Palpation   Spinal mobility Pain and hypomobility L4      FABER test   findings Negative      Prone Knee Bend Test   Findings Negative      Straight Leg Raise   Findings Negative                        Objective measurements completed on examination: See above findings.       [facility] Adult PT Treatment/Exercise - 11/09/20 0001       Exercises   Exercises Lumbar      Lumbar Exercises: Stretches  Pelvic Tilt 15 reps;5 seconds    Other Lumbar Stretch Exercise Self traction supine 90/90 x 3'      Manual Therapy   Manual  Therapy Manual Traction    Manual Traction Bilat LE                    PT Education - 11/09/20 1235     Education Details POC, HEP, FOTO    Person(s) Educated Patient    Methods Explanation;Handout    Comprehension Verbalized understanding              PT Short Term Goals - 11/09/20 1205       PT SHORT TERM GOAL #1   Title The patient will be independent in a basic HEP    Baseline no HEP    Time 2    Period Weeks    Status New    Target Date 11/23/20               PT Long Term Goals - 11/09/20 1242       PT LONG TERM GOAL #1   Title The patient will have an improved FOTO score to 54% for functional activities.    Baseline 37%    Time 6    Period Weeks    Status New    Target Date 12/21/20      PT LONG TERM GOAL #2   Title The patient will be able to perform standing lumbar extension to 50% without pain incresae.    Baseline 50% with pain incresae 5/10    Time 6    Period Weeks    Status New    Target Date 12/21/20      PT LONG TERM GOAL #3   Title The patient will be able to lift his 4 year with proper body mechanics and pain under 3/10.    Time 6    Period Weeks    Target Date 12/21/20                    Plan - 11/09/20 1236     Clinical Impression Statement Tylo is a 55 y/o male who reports a gradual onset of low back pain.  About three weeks ago he was lifting his 50 year old son and twisted.  He aggravated the back.  The MD did x-rays and MRI.  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  The patient is scheduled for a second MRI with contrast media to further assess the tumor.  The patient reports that he has morning stiffness and pain increase with prolong sitting, lifting, and twisting.  His symptoms were aggravated with standing lumbar extension AROM.  The patient's lower extremity reflexes are normal and equal bilaterally.  He tested negative for both sciatic nerve and femoral nerve tension tests.  The patinet does have pain  at L4 with passive intervertebral joint assessment.  Recommend physical therapy for core stabilization, traction, and body Dealer training.    Personal Factors and Comorbidities Comorbidity 1    Comorbidities Bilateral knee arthritis    Examination-Activity Limitations Caring for Others;Carry;Lift;Transfers    Examination-Participation Restrictions Cleaning;Meal Prep;Occupation;Shop;Laundry    Stability/Clinical Decision Making Stable/Uncomplicated    Clinical Decision Making Low    Rehab Potential Good    PT Frequency 2x / week    PT Duration 6 weeks    PT Treatment/Interventions ADLs/Self Care Home Management;Aquatic Therapy;Traction;Gait training;Stair training;Functional mobility training;Therapeutic activities;Therapeutic exercise;Balance training;Neuromuscular re-education;Patient/family education;Dry needling;Joint  Manipulations;Spinal Manipulations;Taping    PT Next Visit Plan Progress core stabilization, QL stretch, review HEP    PT Home Exercise Plan see HEP2go code in patient instructions    Consulted and Agree with Plan of Care Patient             Patient will benefit from skilled therapeutic intervention in order to improve the following deficits and impairments:  Decreased range of motion, Pain, Hypomobility, Decreased knowledge of use of DME, Decreased strength  Visit Diagnosis: Chronic midline low back pain without sciatica - Plan: PT plan of care cert/re-cert  Chronic pain of both knees - Plan: PT plan of care cert/re-cert     Problem List Patient Active Problem List   Diagnosis Date Noted   Migraine 09/01/2020   Therapeutic drug monitoring 07/20/2020   Rectal bleeding 07/20/2020   HIV disease ([place]) 02/16/2020   Healthcare maintenance 02/16/2020   Immunization counseling 02/16/2020   Syphilis 02/16/2020   High risk sexual behavior 02/16/2020   Polyarthralgia 04/23/2019   Prediabetes 04/23/2019   Chronic pain of both knees 03/24/2019   Bilateral hand  pain 03/24/2019   Pain of great toe, right 03/24/2019   Rich Number, PT, DPT, OCS, Crt. DN Bethena Midget 11/09/2020, 1:01 PM  [facility] [address] [place], [place], [address] Phone: [phone]   Fax:  [phone]  Name: Curtis Trevino MRN: [id number] Date of Birth: [birth year]

## 2020-11-11 ENCOUNTER — Other Ambulatory Visit: Payer: Self-pay

## 2020-11-11 ENCOUNTER — Ambulatory Visit: Payer: BC Managed Care – PPO

## 2020-11-11 DIAGNOSIS — G8929 Other chronic pain: Secondary | ICD-10-CM

## 2020-11-11 DIAGNOSIS — M545 Low back pain, unspecified: Secondary | ICD-10-CM | POA: Diagnosis not present

## 2020-11-11 DIAGNOSIS — M25562 Pain in left knee: Secondary | ICD-10-CM

## 2020-11-11 NOTE — Therapy (Signed)
[place], [place], [address] Phone: [phone]   Fax:  [phone]  Physical Therapy Treatment  Patient Details  Name: Curtis Trevino MRN: [id number] Date of Birth: Apr 23, 1965 Referring Provider (PT): [name], MD   Encounter Date: 11/11/2020   PT End of Session - 11/11/20 0928     Visit Number 2    Number of Visits 12    Date for PT Re-Evaluation 12/21/20    Authorization Type BCBS    PT Start Time 0929    PT Stop Time 1009    PT Time Calculation (min) 40 min    Activity Tolerance Patient tolerated treatment well    Behavior During Therapy [facility] for tasks assessed/performed             Past Medical History:  Diagnosis Date   Acid reflux    Chronic back pain     Past Surgical History:  Procedure Laterality Date   CHOLECYSTECTOMY N/A 11/18/2013   Procedure: LAPAROSCOPIC CHOLECYSTECTOMY;  Surgeon: Scherry Ran, MD;  Location: [facility];  Service: General;  Laterality: N/A;   ESOPHAGOGASTRODUODENOSCOPY  2005   SCROTAL EXPLORATION Left 06/23/2016   Procedure: LEFT SCROTAL EXPLORATION WITH EXCISION OF EPIDIDYMAL MASS;  Surgeon: Irine Seal, MD;  Location: [facility];  Service: Urology;  Laterality: Left;    There were no vitals filed for this visit.   Subjective Assessment - 11/11/20 0937     Subjective The patient reports that his knees are bothering him worse then the back today.  They are very stiff.                [facility] PT Assessment - 11/11/20 0001       Assessment   Medical Diagnosis M54.50 (ICD-10-CM) - Acute bilateral low back pain without sciatica    Referring Provider (PT) Barbaraann Barthel, Sharyn Lull, MD    Onset Date/Surgical Date --   gradual onset with aggravation about 3 weeks ago.                          [place] Adult PT Treatment/Exercise - 11/11/20 0001       Lumbar Exercises: Stretches   Passive Hamstring Stretch Left;Right;20 seconds;3 reps     Other Lumbar Stretch Exercise QL stretch standing at door 20" x 3 each      Lumbar Exercises: Supine   Ab Set 10 reps    AB Set Limitations ball squeeze w/ LAQ    Pelvic Tilt 15 reps;5 seconds    Pelvic Tilt Limitations PPT    Heel Slides 20 reps    Heel Slides Limitations LE on SB    Bridge with Ball Squeeze 15 reps;5 seconds    Straight Leg Raise 10 reps    Straight Leg Raises Limitations with PPT      Manual Therapy   Joint Mobilization PA unilateral right and left Grade III L5-L2    Manual Traction Bilat LE in supine; prone sacral distraction grade II/III                    PT Education - 11/11/20 1004     Education Details HEP progression    Person(s) Educated Patient    Methods Explanation;Handout    Comprehension Verbalized understanding              PT Short Term Goals - 11/09/20 1205       PT SHORT  TERM GOAL #1   Title The patient will be independent in a basic HEP    Baseline no HEP    Time 2    Period Weeks    Status New    Target Date 11/23/20               PT Long Term Goals - 11/09/20 1242       PT LONG TERM GOAL #1   Title The patient will have an improved FOTO score to 54% for functional activities.    Baseline 37%    Time 6    Period Weeks    Status New    Target Date 12/21/20      PT LONG TERM GOAL #2   Title The patient will be able to perform standing lumbar extension to 50% without pain incresae.    Baseline 50% with pain incresae 5/10    Time 6    Period Weeks    Status New    Target Date 12/21/20      PT LONG TERM GOAL #3   Title The patient will be able to lift his 4 year with proper body mechanics and pain under 3/10.    Time 6    Period Weeks    Target Date 12/21/20                   Plan - 11/11/20 [id number]     Clinical Impression Statement The patient arrived to therapy reporting more pain in the knees today then the low back.  Continued with traction and added lumbar joint mobs.  The patient had  less reports of pain at L4 vertebral joint segement today.  Progressed core stabilization exercises in supine and added hamstring/QL stretches.  The patient did report fatigue with the stabilization exercises today.  Curtis Trevino is scheduled for a MRI with contrast medium on Monday.  Continue current plan of care to reduce pain with ADLs, including lifting his son up.    Personal Factors and Comorbidities Comorbidity 1    Comorbidities Bilateral knee arthritis    Examination-Activity Limitations Caring for Others;Carry;Lift;Transfers    Examination-Participation Restrictions Cleaning;Meal Prep;Occupation;Shop;Laundry    PT Treatment/Interventions ADLs/Self Care Home Management;Aquatic Therapy;Traction;Gait training;Stair training;Functional mobility training;Therapeutic activities;Therapeutic exercise;Balance training;Neuromuscular re-education;Patient/family education;Dry needling;Joint Manipulations;Spinal Manipulations;Taping    PT Next Visit Plan Progress core stabilization, QL stretch, review HEP    PT Home Exercise Plan [email or web address]    Consulted and Agree with Plan of Care Patient             Patient will benefit from skilled therapeutic intervention in order to improve the following deficits and impairments:  Decreased range of motion, Pain, Hypomobility, Decreased knowledge of use of DME, Decreased strength  Visit Diagnosis: Chronic midline low back pain without sciatica  Chronic pain of both knees     Problem List Patient Active Problem List   Diagnosis Date Noted   Migraine 09/01/2020   Therapeutic drug monitoring 07/20/2020   Rectal bleeding 07/20/2020   HIV disease ([place]) 02/16/2020   Healthcare maintenance 02/16/2020   Immunization counseling 02/16/2020   Syphilis 02/16/2020   High risk sexual behavior 02/16/2020   Polyarthralgia 04/23/2019   Prediabetes 04/23/2019   Chronic pain of both knees 03/24/2019   Bilateral hand pain 03/24/2019   Pain of great toe, right  03/24/2019   Rich Number, PT, DPT, OCS, Crt. DN  Bethena Midget 11/11/2020, 10:08 AM  [place]  [place], [place], [address] Phone: [phone]   Fax:  [phone]  Name: Curtis Trevino MRN: [id number] Date of Birth: [birth year]

## 2020-11-11 NOTE — Patient Instructions (Signed)
Access Code: [email or web address] URL: [email or web address] Date: 11/11/2020 Prepared by: Rich Number  Exercises Supine Posterior Pelvic Tilt - 1 x daily - 7 x weekly - 1 sets - 15 reps - 3 hold Small Range Straight Leg Raise - 1 x daily - 7 x weekly - 1 sets - 10 reps - 3 hold Supine Bridge with Mini Swiss Ball Between Knees - 1 x daily - 7 x weekly - 1 sets - 15 reps - 5 hold Supine Hamstring Stretch with Strap - 1 x daily - 7 x weekly - 1 sets - 3 reps - 20 hold Standing Quadratus Lumborum Stretch with Doorway - 1 x daily - 7 x weekly - 1 sets - 2-3 reps - 20 hold

## 2020-11-15 ENCOUNTER — Ambulatory Visit
Admission: RE | Admit: 2020-11-15 | Discharge: 2020-11-15 | Disposition: A | Discharge status: Home / Self Care | Payer: BC Managed Care – PPO | Source: Ambulatory Visit | Attending: Family Medicine | Admitting: Family Medicine

## 2020-11-15 ENCOUNTER — Other Ambulatory Visit: Payer: Self-pay

## 2020-11-15 DIAGNOSIS — M545 Low back pain, unspecified: Secondary | ICD-10-CM

## 2020-11-15 MED ORDER — GADOBENATE DIMEGLUMINE 529 MG/ML IV SOLN
17.0000 mL | Freq: Once | INTRAVENOUS | Status: AC | PRN
  Administered 2020-11-15: 17 mL via INTRAVENOUS

## 2020-11-23 ENCOUNTER — Other Ambulatory Visit: Payer: Self-pay

## 2020-11-23 ENCOUNTER — Ambulatory Visit: Payer: BC Managed Care – PPO

## 2020-11-23 DIAGNOSIS — M25562 Pain in left knee: Secondary | ICD-10-CM

## 2020-11-23 DIAGNOSIS — G8929 Other chronic pain: Secondary | ICD-10-CM

## 2020-11-23 DIAGNOSIS — M545 Low back pain, unspecified: Secondary | ICD-10-CM | POA: Diagnosis not present

## 2020-11-23 NOTE — Therapy (Signed)
[place], [place], [address] Phone: [phone]   Fax:  [phone]  Physical Therapy Treatment  Patient Details  Name: Curtis Trevino MRN: [id number] Date of Birth: 06-Jan-1966 Referring Provider (PT): [name], MD   Encounter Date: 11/23/2020   PT End of Session - 11/23/20 1501     Visit Number 3    Number of Visits 12    Date for PT Re-Evaluation 12/21/20    Authorization Type BCBS    PT Start Time 1501    PT Stop Time 1543    PT Time Calculation (min) 42 min    Activity Tolerance Patient tolerated treatment well    Behavior During Therapy [name] for tasks assessed/performed             Past Medical History:  Diagnosis Date   Acid reflux    Chronic back pain     Past Surgical History:  Procedure Laterality Date   CHOLECYSTECTOMY N/A 11/18/2013   Procedure: LAPAROSCOPIC CHOLECYSTECTOMY;  Surgeon: Scherry Ran, MD;  Location: [facility];  Service: General;  Laterality: N/A;   ESOPHAGOGASTRODUODENOSCOPY  2005   SCROTAL EXPLORATION Left 06/23/2016   Procedure: LEFT SCROTAL EXPLORATION WITH EXCISION OF EPIDIDYMAL MASS;  Surgeon: Irine Seal, MD;  Location: [facility];  Service: Urology;  Laterality: Left;    There were no vitals filed for this visit.   Subjective Assessment - 11/23/20 1513     Subjective The patient reports that he got his MRI and he will see the MD tomorrow to review it.   The patient reports that his right knee is still bothering him.  He did something to it last week.    Limitations Lifting;House hold activities;Other (comment)    Diagnostic tests [id number]:  1. Unchanged small intradural nodule at L1-2, indeterminate but  possibly a dermoid or epidermoid. The lack of enhancement makes a  nerve sheath tumor or other neoplasm much less likely.  2. Unchanged lumbar disc and facet degeneration with mild lateral  recess stenosis at L3-4.    Patient Stated Goals to have  less pain and be able to pick up his  year old.    Currently in Pain? Yes    Pain Score 5     Pain Location Knee    Pain Orientation Right                [facility] PT Assessment - 11/23/20 0001       Assessment   Medical Diagnosis M54.50 (ICD-10-CM) - Acute bilateral low back pain without sciatica    Referring Provider (PT) Barbaraann Barthel, Sharyn Lull, MD    Onset Date/Surgical Date --   gradual onset with aggravation about 3 weeks ago.     Palpation   Spinal mobility Pain and hypomobility L4                           [facility] Adult PT Treatment/Exercise - 11/23/20 0001       Lumbar Exercises: Stretches   Passive Hamstring Stretch Left;Right;20 seconds;3 reps    Lower Trunk Rotation 20 seconds;3 reps    Pelvic Tilt 15 reps;5 seconds    Other Lumbar Stretch Exercise QL stretch standing at door 20" x 3 each      Lumbar Exercises: Supine   Ab Set 10 reps    AB Set Limitations ball squeeze w/ LAQ    Heel Slides 20 reps  Heel Slides Limitations LE on SB    Bridge with Ball Squeeze 15 reps;5 seconds    Straight Leg Raise 15 reps    Straight Leg Raises Limitations with PPT 5 sec hold    Other Supine Lumbar Exercises LTR w/ SB x 10 each      Manual Therapy   Joint Mobilization PA unilateral right and left Grade III L5-L2    Manual Traction Bilat LE in supine; prone sacral distraction grade II/III                      PT Short Term Goals - 11/09/20 1205       PT SHORT TERM GOAL #1   Title The patient will be independent in a basic HEP    Baseline no HEP    Time 2    Period Weeks    Status New    Target Date 11/23/20               PT Long Term Goals - 11/09/20 1242       PT LONG TERM GOAL #1   Title The patient will have an improved FOTO score to 54% for functional activities.    Baseline 37%    Time 6    Period Weeks    Status New    Target Date 12/21/20      PT LONG TERM GOAL #2   Title The patient will be able to perform standing  lumbar extension to 50% without pain incresae.    Baseline 50% with pain incresae 5/10    Time 6    Period Weeks    Status New    Target Date 12/21/20      PT LONG TERM GOAL #3   Title The patient will be able to lift his 4 year with proper body mechanics and pain under 3/10.    Time 6    Period Weeks    Target Date 12/21/20                   Plan - 11/23/20 1501     Clinical Impression Statement The patient did have his MRI with contrast.  The results did reveal degenerative disc with stenosis.  The patient has a MD appointment tomorrow to review the results.  The patient arrived to therapy reporting that his right knee is bothering him more then the back.  he did something to it last week.  Therapy continued to focuse on lumbar core stabilization, traction, and flexion based exercises.  Recommend continued therapy per the referring MD for reduction with standing and walking activities.    Personal Factors and Comorbidities Comorbidity 1    Comorbidities Bilateral knee arthritis    Examination-Activity Limitations Caring for Others;Carry;Lift;Transfers    Examination-Participation Restrictions Cleaning;Meal Prep;Occupation;Shop;Laundry    PT Treatment/Interventions ADLs/Self Care Home Management;Aquatic Therapy;Traction;Gait training;Stair training;Functional mobility training;Therapeutic activities;Therapeutic exercise;Balance training;Neuromuscular re-education;Patient/family education;Dry needling;Joint Manipulations;Spinal Manipulations;Taping    PT Next Visit Plan Progress core stabilization, QL stretch, review HEP    PT Home Exercise Plan [id number]    Consulted and Agree with Plan of Care Patient             Patient will benefit from skilled therapeutic intervention in order to improve the following deficits and impairments:  Decreased range of motion, Pain, Hypomobility, Decreased knowledge of use of DME, Decreased strength  Visit Diagnosis: Chronic midline low  back pain without sciatica  Chronic pain of both knees  Problem List Patient Active Problem List   Diagnosis Date Noted   Migraine 09/01/2020   Therapeutic drug monitoring 07/20/2020   Rectal bleeding 07/20/2020   HIV disease ([place]) 02/16/2020   Healthcare maintenance 02/16/2020   Immunization counseling 02/16/2020   Syphilis 02/16/2020   High risk sexual behavior 02/16/2020   Polyarthralgia 04/23/2019   Prediabetes 04/23/2019   Chronic pain of both knees 03/24/2019   Bilateral hand pain 03/24/2019   Pain of great toe, right 03/24/2019   Rich Number, PT, DPT, OCS, Crt. DN  Bethena Midget 11/23/2020, 3:43 PM  [facility] [address] [place], [place], [address] Phone: [phone]   Fax:  [phone]  Name: Curtis Trevino MRN: [id number] Date of Birth: February 24, 1966

## 2020-11-24 ENCOUNTER — Ambulatory Visit (INDEPENDENT_AMBULATORY_CARE_PROVIDER_SITE_OTHER): Payer: BC Managed Care – PPO | Admitting: Family Medicine

## 2020-11-24 VITALS — Ht 68.0 in | Wt 180.0 lb

## 2020-11-24 DIAGNOSIS — M25561 Pain in right knee: Secondary | ICD-10-CM

## 2020-11-24 DIAGNOSIS — M25562 Pain in left knee: Secondary | ICD-10-CM

## 2020-11-24 DIAGNOSIS — G8929 Other chronic pain: Secondary | ICD-10-CM

## 2020-11-24 NOTE — Patient Instructions (Signed)
Thank you for coming in today. 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 At that time we can consider a repeat knee steroid injection Continue physical therapy Curtis Trevino will look into gel shots for your knee

## 2020-11-24 NOTE — Progress Notes (Signed)
PCP: [name], MD  Subjective:   HPI: Patient is a 55 y.o. male here for follow up low back pain from herniated disc and right knee pain. He has been out of work, going to PT, and taking his meloxicam daily, all of which is helping. He needs a refill of the meloxicam. He does not feel able to return to work due to limited ability to walk for extended periods of time. He no longer has numbness/tingling of his right leg but his back still limits his sleep to 4 hrs a night. The back pain is generalized in the lumbar region, L>R, non-radiating, dull, and worse at night. The right knee pain is generalized, sharp, with associated grinding, and worse with stairs or resisted knee extension.  Past Medical History:  Diagnosis Date   Acid reflux    Chronic back pain     Current Outpatient Medications on File Prior to Visit  Medication Sig Dispense Refill   Dolutegravir-lamiVUDine (DOVATO) 50-300 MG TABS Take 1 tablet by mouth daily. 30 tablet 5   meloxicam (MOBIC) 15 MG tablet Take 1 tablet (15 mg total) by mouth daily. 30 tablet 2   topiramate (TOPAMAX) 25 MG tablet Take 1 tablet (25 mg total) by mouth 2 (two) times daily. 60 tablet 3   [DISCONTINUED] cetirizine (ZYRTEC) 10 MG tablet Take 1 tablet (10 mg total) by mouth daily. 30 tablet 0   [DISCONTINUED] omeprazole (PRILOSEC OTC) 20 MG tablet Take 20 mg by mouth at bedtime.      No current facility-administered medications on file prior to visit.    Past Surgical History:  Procedure Laterality Date   CHOLECYSTECTOMY N/A 11/18/2013   Procedure: LAPAROSCOPIC CHOLECYSTECTOMY;  Surgeon: Scherry Ran, MD;  Location: [facility];  Service: General;  Laterality: N/A;   ESOPHAGOGASTRODUODENOSCOPY  2005   SCROTAL EXPLORATION Left 06/23/2016   Procedure: LEFT SCROTAL EXPLORATION WITH EXCISION OF EPIDIDYMAL MASS;  Surgeon: Irine Seal, MD;  Location: [facility];  Service: Urology;  Laterality: Left;    Allergies  Allergen Reactions   Codeine Hives,  Itching and Rash   Tylenol [Acetaminophen] Nausea And Vomiting    Vomiting every time he takes tylenol    Social History   Socioeconomic History   Marital status: Single    Spouse name: Not on file   Number of children: Not on file   Years of education: Not on file   Highest education level: Not on file  Occupational History   Not on file  Tobacco Use   Smoking status: Former    Types: Cigarettes    Quit date: 11/15/2010    Years since quitting: 10.0   Smokeless tobacco: Never  Substance and Sexual Activity   Alcohol use: Not Currently    Alcohol/week: 3.0 standard drinks    Types: 3 Shots of liquor per week    Comment: 3 drink a day   Drug use: No   Sexual activity: Yes    Partners: Male  Other Topics Concern   Not on file  Social History Narrative   Not on file   Social Determinants of Health   Financial Resource Strain: Not on file  Food Insecurity: Not on file  Transportation Needs: Not on file  Physical Activity: Not on file  Stress: Not on file  Social Connections: Not on file  Intimate Partner Violence: Not on file    Family History  Problem Relation Age of Onset   Diabetes Mother    Heart failure Mother  Cancer Mother    Heart failure Father    Cancer Father     Ht '5\' 8"'$  (1.727 m)   Wt 180 lb (81.6 kg)   BMI 27.37 kg/m   No flowsheet data found.  No flowsheet data found.  Review of Systems: See HPI above.     Objective:  Physical Exam:  Gen: NAD, comfortable in exam room Knee, right: No TTP. Inspection was negative for erythema, ecchymosis, and effusion. No obvious bony abnormalities or signs of osteophyte development. Palpation yielded no asymmetric warmth; No joint line tenderness; No condyle tenderness; No patellar tenderness; + knee crepitus. Patellar and quadriceps tendons unremarkable, and no tenderness of the pes anserine bursa. No obvious Baker's cyst development. ROM normal in flexion (135 degrees) and extension (0 degrees).  Strength 5/5 with knee flexion and extension. Neurovascularly intact bilaterally.   Provocative Testing:    - Patella:   - Patellar grind/compression: NEG   - Patellar glide: Appropriate medial/lateral glide without apprehension - Cruciate Ligaments:   - Anterior Drawer/Lachman test: NEG - Posterior Drawer: NEG  - Collateral Ligaments:   - Varus/Valgus (MCL/LCL) Stress test at 0, 15d: NEG   - Meniscus:   - Thessaly: mildly positive medially   - McMurray's: positive   Lumbar spine:  - Inspection: no gross deformity or scoliosis; no swelling or ecchymosis. No skin changes - Palpation: No TTP over the spinous processes, paraspinal muscles, or SI joints b/l - ROM: full active ROM of the lumbar spine in flexion and extension without pain - Strength: 5/5 strength of lower extremity in L4-S1 nerve root distributions b/l  *L1/L2: Hip Flexion & Abduction  *L3/L4: Knee Extension  *L4/L5: Ankle Dorsiflexion  *L5: Great Toe Extension  *S1: Ankle Plantar Flexion - Neuro: sensation intact in the L4-S1 nerve root distribution b/l, 2+ L4 and S1 reflexes - Provocative Testing: Negative straight leg raise    Assessment & Plan:  1. Lumbar disc herniation Much improved since last visit per chart review, with no more radicular symptoms. PT is helping. Still not able to functionally work on his feet for a full shift at [employer].  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 OA, right knee Still in exacerbation, complicated by Lumbar disc herniation above.  - continue PT, mobic, voltaren gel  - follow up in 5-6 weeks, can reconsider CSI if no improvement at his follow up visit  Windy Fast, MD, visiting resident  Patient seen and examined with resident.  Agree with his note and findings.  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  Continue with physical therapy.  Will look into  viscosupplementation for his knee as well.

## 2020-11-25 ENCOUNTER — Other Ambulatory Visit: Payer: Self-pay

## 2020-11-25 ENCOUNTER — Encounter: Payer: Self-pay | Admitting: Family Medicine

## 2020-11-25 ENCOUNTER — Ambulatory Visit: Payer: BC Managed Care – PPO

## 2020-11-25 DIAGNOSIS — M545 Low back pain, unspecified: Secondary | ICD-10-CM

## 2020-11-25 DIAGNOSIS — M25562 Pain in left knee: Secondary | ICD-10-CM

## 2020-11-25 DIAGNOSIS — G8929 Other chronic pain: Secondary | ICD-10-CM

## 2020-11-25 MED ORDER — MELOXICAM 15 MG PO TABS: 15.0000 mg | ORAL_TABLET | Freq: Every day | ORAL | 2 refills | Status: DC

## 2020-11-25 NOTE — Therapy (Signed)
[place], [place], [address] Phone: [phone]   Fax:  [phone]  Physical Therapy Treatment  Patient Details  Name: QUAID GOCHNAUER MRN: [id number] Date of Birth: Sep 07, 1965 Referring Provider (PT): [name], MD   Encounter Date: 11/25/2020   PT End of Session - 11/25/20 0854     Visit Number 4    Number of Visits 12    Date for PT Re-Evaluation 12/21/20    Authorization Type BCBS 6th visit FOTO, 10th visit FOTO    PT Start Time 0757    PT Stop Time 0848    PT Time Calculation (min) 51 min    Activity Tolerance Patient tolerated treatment well    Behavior During Therapy [facility] for tasks assessed/performed             Past Medical History:  Diagnosis Date   Acid reflux    Chronic back pain     Past Surgical History:  Procedure Laterality Date   CHOLECYSTECTOMY N/A 11/18/2013   Procedure: LAPAROSCOPIC CHOLECYSTECTOMY;  Surgeon: Scherry Ran, MD;  Location: [facility];  Service: General;  Laterality: N/A;   ESOPHAGOGASTRODUODENOSCOPY  2005   SCROTAL EXPLORATION Left 06/23/2016   Procedure: LEFT SCROTAL EXPLORATION WITH EXCISION OF EPIDIDYMAL MASS;  Surgeon: Irine Seal, MD;  Location: [facility];  Service: Urology;  Laterality: Left;    There were no vitals filed for this visit.   Subjective Assessment - 11/25/20 0808     Subjective The patient states that his right knee is still bothering him more then the back.  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  The patient is not sure if his work will accomodate that.  He has to wait a few more weeks before he can get another injection into the knees.  He is doing his exercises at home.    Limitations Lifting;House hold activities;Other (comment)    Patient Stated Goals to have less pain and be able to pick up his  year old.                [facility] PT Assessment - 11/25/20 0001       Assessment   Medical Diagnosis  M54.50 (ICD-10-CM) - Acute bilateral low back pain without sciatica    Referring Provider (PT) Barbaraann Barthel, Sharyn Lull, MD      AROM   Lumbar Flexion 75% absence of left lateral shift    Lumbar Extension 50% with pain increase    Lumbar - Right Side Bend to knee    Lumbar - Left Side Bend to knee    Lumbar - Right Rotation 50%    Lumbar - Left Rotation 50%      Palpation   Spinal mobility Pain and hypomobility L4                           [facility] Adult PT Treatment/Exercise - 11/25/20 0001       Lumbar Exercises: Stretches   Passive Hamstring Stretch Left;Right;20 seconds;3 reps    Lower Trunk Rotation --    ITB Stretch Right;Left;3 reps;20 seconds    Other Lumbar Stretch Exercise QL stretch standing at door 20" x 3 each    Other Lumbar Stretch Exercise Supine hip flexor stretch 1' x 2 each      Lumbar Exercises: Supine   Ab Set 10 reps    AB Set Limitations  ball squeeze w/ LAQ    Clam 5 seconds;15 reps    Clam Limitations Blue TB    Heel Slides 20 reps    Heel Slides Limitations LE on SB    Bridge with clamshell 15 reps;5 seconds    Bridge with [employer] Limitations Blue TB    Straight Leg Raise 15 reps    Straight Leg Raises Limitations with PPT 5 sec hold    Other Supine Lumbar Exercises LTR w/ SB x 10 each      Manual Therapy   Joint Mobilization PA unilateral right and left Grade III L5-L2    Manual Traction Bilat LE traction in supine w/ pillow under knees, Right hip lateral distraction grade III, Prone hip anterior mobs grade III.                      PT Short Term Goals - 11/25/20 0819       PT SHORT TERM GOAL #1   Title The patient will be independent in a basic HEP    Baseline no HEP    Time 2    Period Weeks    Status Achieved    Target Date 11/23/20               PT Long Term Goals - 11/09/20 1242       PT LONG TERM GOAL #1   Title The patient will have an improved FOTO score to 54% for functional activities.     Baseline 37%    Time 6    Period Weeks    Status New    Target Date 12/21/20      PT LONG TERM GOAL #2   Title The patient will be able to perform standing lumbar extension to 50% without pain incresae.    Baseline 50% with pain incresae 5/10    Time 6    Period Weeks    Status New    Target Date 12/21/20      PT LONG TERM GOAL #3   Title The patient will be able to lift his 4 year with proper body mechanics and pain under 3/10.    Time 6    Period Weeks    Target Date 12/21/20                   Plan - 11/25/20 0756     Clinical Impression Statement Numair was referred to physical therapy for low back pain.  He had a recent MRI that revealed degenerative disc with stenosis.  The patient also has a history of bilateral knee arthritis.  His right knee has been bothering him more then the back and he is not due for another injection until a few weeks.  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  Therapy is focusing on core/hip stabilization and hip/lumbar flexibility.  The patient has reports of right deep hip pain limiting exercises.  Added hip mobs to reduce the pain for exercises and add reduce strain to the lumbar spine.  The patient presents with improved movement with lumbar forward flexion as he no longers deviates to the left.  Recommend continued therapy for return to premorbid levels including prolong walking/standing and lifting his childern.    Personal Factors and Comorbidities Comorbidity 1    Comorbidities Bilateral knee arthritis    Examination-Activity Limitations Caring for Others;Carry;Lift;Transfers  Examination-Participation Restrictions Cleaning;Meal Prep;Occupation;Shop;Laundry    PT Treatment/Interventions ADLs/Self Care Home Management;Aquatic Therapy;Traction;Gait training;Stair training;Functional mobility training;Therapeutic activities;Therapeutic exercise;Balance  training;Neuromuscular re-education;Patient/family education;Dry needling;Joint Manipulations;Spinal Manipulations;Taping    PT Next Visit Plan bike/Nustep, Progress core stabilization, progress HEP, hip extension mobs,    PT Home Exercise Plan [email or web address]    Consulted and Agree with Plan of Care Patient             Patient will benefit from skilled therapeutic intervention in order to improve the following deficits and impairments:  Decreased range of motion, Pain, Hypomobility, Decreased knowledge of use of DME, Decreased strength  Visit Diagnosis: Chronic midline low back pain without sciatica  Chronic pain of both knees     Problem List Patient Active Problem List   Diagnosis Date Noted   Migraine 09/01/2020   Therapeutic drug monitoring 07/20/2020   Rectal bleeding 07/20/2020   HIV disease ([place]) 02/16/2020   Healthcare maintenance 02/16/2020   Immunization counseling 02/16/2020   Syphilis 02/16/2020   High risk sexual behavior 02/16/2020   Polyarthralgia 04/23/2019   Prediabetes 04/23/2019   Chronic pain of both knees 03/24/2019   Bilateral hand pain 03/24/2019   Pain of great toe, right 03/24/2019   Rich Number, PT, DPT, OCS, Crt. DN  Bethena Midget 11/25/2020, 8:56 AM  [facility] [address] [place], [place], [address] Phone: [phone]   Fax:  [phone]  Name: CLEVON RUBENDALL MRN: [id number] Date of Birth: 16-Dec-1965

## 2020-12-02 ENCOUNTER — Ambulatory Visit: Payer: BC Managed Care – PPO

## 2020-12-02 ENCOUNTER — Other Ambulatory Visit: Payer: Self-pay

## 2020-12-02 DIAGNOSIS — M545 Low back pain, unspecified: Secondary | ICD-10-CM | POA: Diagnosis not present

## 2020-12-02 DIAGNOSIS — G8929 Other chronic pain: Secondary | ICD-10-CM

## 2020-12-02 NOTE — Therapy (Signed)
[place], [place], [address] Phone: [phone]   Fax:  [phone]  Physical Therapy Treatment  Patient Details  Name: Curtis Trevino MRN: [id number] Date of Birth: Sep 21, 1965 Referring Provider (PT): [name], MD   Encounter Date: 12/02/2020   PT End of Session - 12/02/20 0839     Visit Number 5    Number of Visits 12    Authorization Type BCBS 6th visit FOTO, 10th visit FOTO    PT Start Time 0804    PT Stop Time 0848    PT Time Calculation (min) 44 min    Activity Tolerance Patient tolerated treatment well    Behavior During Therapy [facility] for tasks assessed/performed             Past Medical History:  Diagnosis Date   Acid reflux    Chronic back pain     Past Surgical History:  Procedure Laterality Date   CHOLECYSTECTOMY N/A 11/18/2013   Procedure: LAPAROSCOPIC CHOLECYSTECTOMY;  Surgeon: Scherry Ran, MD;  Location: [facility];  Service: General;  Laterality: N/A;   ESOPHAGOGASTRODUODENOSCOPY  2005   SCROTAL EXPLORATION Left 06/23/2016   Procedure: LEFT SCROTAL EXPLORATION WITH EXCISION OF EPIDIDYMAL MASS;  Surgeon: Irine Seal, MD;  Location: [facility];  Service: Urology;  Laterality: Left;    There were no vitals filed for this visit.   Subjective Assessment - 12/02/20 0807     Subjective Pt reports his R knee is hurting badly and his L anterior hip hurts when asc/dsc steps.    Limitations Lifting;House hold activities;Other (comment)    Diagnostic tests [id number]:  1. Unchanged small intradural nodule at L1-2, indeterminate but  possibly a dermoid or epidermoid. The lack of enhancement makes a  nerve sheath tumor or other neoplasm much less likely.  2. Unchanged lumbar disc and facet degeneration with mild lateral  recess stenosis at L3-4.    Patient Stated Goals to have less pain and be able to pick up his  year old.    Currently in Pain? Yes    Pain Score 2     Pain  Location Knee    Pain Orientation Right    Pain Descriptors / Indicators Throbbing    Aggravating Factors  lifting, twisting, prolong sitting, sudden movements, mornings    Pain Relieving Factors position change, walking, stretches    Effect of Pain on Daily Activities Limited with lifting his son.                               [facility] Adult PT Treatment/Exercise - 12/02/20 0001       Exercises   Exercises Lumbar      Lumbar Exercises: Stretches   Passive Hamstring Stretch Left;Right;20 seconds;2 reps    Passive Hamstring Stretch Limitations seated    ITB Stretch Right;Left;20 seconds;2 reps    Other Lumbar Stretch Exercise QL stretch standing at door 20" x 3 each    Other Lumbar Stretch Exercise Supine hip flexor stretch 1' x 2 each      Lumbar Exercises: Standing   Other Standing Lumbar Exercises Hip ext c knee flexed, plantar grade, L 15x      Lumbar Exercises: Supine   Pelvic Tilt 10 reps   3 sec   Clam 5 seconds;15 reps    Clam Limitations Blue TB    Bridge 10 reps    Bridge Limitations with  ab set prior    Straight Leg Raise 15 reps    Straight Leg Raises Limitations with PPT 5 sec hold      Manual Therapy   Manual Traction Bilat LE traction in supine w/ pillow under knees, Right hip lateral distraction grade III                      PT Short Term Goals - 11/25/20 0819       PT SHORT TERM GOAL #1   Title The patient will be independent in a basic HEP    Baseline no HEP    Time 2    Period Weeks    Status Achieved    Target Date 11/23/20               PT Long Term Goals - 11/09/20 1242       PT LONG TERM GOAL #1   Title The patient will have an improved FOTO score to 54% for functional activities.    Baseline 37%    Time 6    Period Weeks    Status New    Target Date 12/21/20      PT LONG TERM GOAL #2   Title The patient will be able to perform standing lumbar extension to 50% without pain incresae.    Baseline  50% with pain incresae 5/10    Time 6    Period Weeks    Status New    Target Date 12/21/20      PT LONG TERM GOAL #3   Title The patient will be able to lift his 4 year with proper body mechanics and pain under 3/10.    Time 6    Period Weeks    Target Date 12/21/20                   Plan - 12/02/20 0858     Clinical Impression Statement PT was continued for lumbopelvic/hip flexibility and strengthening. Pt reports his low back pain is responding well, his L hip bothers him primarily in wt. bearing situations, and his R knee is hurting significantly. Pt tolerated today's session without adverse effects.    Personal Factors and Comorbidities Comorbidity 1    Comorbidities Bilateral knee arthritis    Examination-Activity Limitations Caring for Others;Carry;Lift;Transfers    Examination-Participation Restrictions Cleaning;Meal Prep;Occupation;Shop;Laundry    Stability/Clinical Decision Making Stable/Uncomplicated    Clinical Decision Making Low    Rehab Potential Good    PT Frequency 2x / week    PT Duration 6 weeks    PT Treatment/Interventions ADLs/Self Care Home Management;Aquatic Therapy;Traction;Gait training;Stair training;Functional mobility training;Therapeutic activities;Therapeutic exercise;Balance training;Neuromuscular re-education;Patient/family education;Dry needling;Joint Manipulations;Spinal Manipulations;Taping    PT Next Visit Plan bike/Nustep, Progress core stabilization, progress HEP, hip extension mobs,    PT Home Exercise Plan [email or web address]    Consulted and Agree with Plan of Care Patient             Patient will benefit from skilled therapeutic intervention in order to improve the following deficits and impairments:  Decreased range of motion, Pain, Hypomobility, Decreased knowledge of use of DME, Decreased strength  Visit Diagnosis: Chronic midline low back pain without sciatica  Chronic pain of both knees     Problem List Patient Active  Problem List   Diagnosis Date Noted   Migraine 09/01/2020   Therapeutic drug monitoring 07/20/2020   Rectal bleeding 07/20/2020   HIV disease ([place]) 02/16/2020   Healthcare maintenance  02/16/2020   Immunization counseling 02/16/2020   Syphilis 02/16/2020   High risk sexual behavior 02/16/2020   Polyarthralgia 04/23/2019   Prediabetes 04/23/2019   Chronic pain of both knees 03/24/2019   Bilateral hand pain 03/24/2019   Pain of great toe, right 03/24/2019    Gar Ponto MS, PT 12/02/20 9:11 AM   [place], [place], [address] Phone: [phone]   Fax:  [phone]  Name: Curtis Trevino MRN: [id number] Date of Birth: 1966/02/13

## 2020-12-08 ENCOUNTER — Ambulatory Visit: Payer: BC Managed Care – PPO

## 2020-12-08 ENCOUNTER — Other Ambulatory Visit: Payer: Self-pay

## 2020-12-08 DIAGNOSIS — M545 Low back pain, unspecified: Secondary | ICD-10-CM | POA: Diagnosis not present

## 2020-12-08 DIAGNOSIS — G8929 Other chronic pain: Secondary | ICD-10-CM

## 2020-12-08 DIAGNOSIS — M25562 Pain in left knee: Secondary | ICD-10-CM

## 2020-12-08 NOTE — Therapy (Signed)
[place], [place], [address] Phone: [phone]   Fax:  [phone]  Physical Therapy Treatment  Patient Details  Name: Curtis Trevino MRN: [id number] Date of Birth: 1965-07-03 Referring Provider (PT): [name], MD   Encounter Date: 12/08/2020   PT End of Session - 12/08/20 0827     Visit Number 6    Number of Visits 12    Date for PT Re-Evaluation 12/21/20    Authorization Type BCBS 6th visit FOTO, 10th visit FOTO    PT Start Time 0805    PT Stop Time 0848    PT Time Calculation (min) 43 min    Activity Tolerance Patient tolerated treatment well    Behavior During Therapy [facility] for tasks assessed/performed             Past Medical History:  Diagnosis Date   Acid reflux    Chronic back pain     Past Surgical History:  Procedure Laterality Date   CHOLECYSTECTOMY N/A 11/18/2013   Procedure: LAPAROSCOPIC CHOLECYSTECTOMY;  Surgeon: Scherry Ran, MD;  Location: [facility];  Service: General;  Laterality: N/A;   ESOPHAGOGASTRODUODENOSCOPY  2005   SCROTAL EXPLORATION Left 06/23/2016   Procedure: LEFT SCROTAL EXPLORATION WITH EXCISION OF EPIDIDYMAL MASS;  Surgeon: Irine Seal, MD;  Location: [facility];  Service: Urology;  Laterality: Left;    There were no vitals filed for this visit.   Subjective Assessment - 12/08/20 0815     Subjective Pt reports his low back is doing well. Currently his R knee and L hip are bothering him the most. Pt reports being able to lift is youngest child with no or little back pain.    Diagnostic tests [id number]:  1. Unchanged small intradural nodule at L1-2, indeterminate but  possibly a dermoid or epidermoid. The lack of enhancement makes a  nerve sheath tumor or other neoplasm much less likely.  2. Unchanged lumbar disc and facet degeneration with mild lateral  recess stenosis at L3-4.    Patient Stated Goals To have less pain and be able to pick up his year  old.    Pain Score 5     Pain Location Knee    Pain Orientation Right    Pain Descriptors / Indicators Throbbing    Multiple Pain Sites Yes    Pain Score 7    Pain Orientation Left    Pain Descriptors / Indicators Sharp    Pain Type Acute pain    Pain Onset More than a month ago    Pain Frequency Intermittent    Aggravating Factors  Going up and down steps                               [facility] Adult PT Treatment/Exercise - 12/08/20 0001       Exercises   Exercises Lumbar;Knee/Hip      Lumbar Exercises: Stretches   Passive Hamstring Stretch Left;Right;20 seconds;2 reps    Passive Hamstring Stretch Limitations supine c strap    Quad Stretch Right;Left;2 reps;20 seconds    Quad Stretch Limitations prone with strap    ITB Stretch Right;Left;2 reps;20 seconds    ITB Stretch Limitations supine c strap    Piriformis Stretch Right;Left;2 reps;20 seconds      Lumbar Exercises: Supine   Bridge 10 reps    Bridge Limitations with ab set prior    Straight Leg  Raise 15 reps   L and R   Straight Leg Raises Limitations with PPT 5 sec hold      Knee/Hip Exercises: Seated   Clamshell with TheraBand Red   L and R, single leg, 15x   Other Seated Knee/Hip Exercises Single leg hip abd, 15x                    PT Education - 12/08/20 0901     Education Details HEP update    Person(s) Educated Patient    Methods Explanation;Demonstration;Tactile cues;Verbal cues;Handout    Comprehension Verbalized understanding;Returned demonstration;Verbal cues required;Tactile cues required              PT Short Term Goals - 11/25/20 0819       PT SHORT TERM GOAL #1   Title The patient will be independent in a basic HEP    Baseline no HEP    Time 2    Period Weeks    Status Achieved    Target Date 11/23/20               PT Long Term Goals - 11/09/20 1242       PT LONG TERM GOAL #1   Title The patient will have an improved FOTO score to 54% for  functional activities.    Baseline 37%    Time 6    Period Weeks    Status New    Target Date 12/21/20      PT LONG TERM GOAL #2   Title The patient will be able to perform standing lumbar extension to 50% without pain incresae.    Baseline 50% with pain incresae 5/10    Time 6    Period Weeks    Status New    Target Date 12/21/20      PT LONG TERM GOAL #3   Title The patient will be able to lift his 4 year with proper body mechanics and pain under 3/10.    Time 6    Period Weeks    Target Date 12/21/20                   Plan - 12/08/20 0910     Clinical Impression Statement PT was provided to address R knee and L hip pain. With the L hip, pt only experiences sharp pain c asc and dsc steps with the L LE being the controlling leg. FABER, FADER, and scour tests were all negative. The L hip was not significantly tender to palpation of the lateral L hip. Pt was instructed in flexibility and strengthening exs for the R and L hip and knee. All exs were tolerated well except for SLR R which increase the R knee pain. L hip pain is reproduced with stessed wt bearing, asc/dsc steps. Pt will benefit from continued PT to address R knee and L hip pain to improve his functional mobility.    Personal Factors and Comorbidities Comorbidity 1    Comorbidities Bilateral knee arthritis    Examination-Activity Limitations Caring for Others;Carry;Lift;Transfers    Examination-Participation Restrictions Cleaning;Meal Prep;Occupation;Shop;Laundry    Stability/Clinical Decision Making Stable/Uncomplicated    Clinical Decision Making Low    Rehab Potential Good    PT Frequency 2x / week    PT Duration 6 weeks    PT Treatment/Interventions ADLs/Self Care Home Management;Aquatic Therapy;Traction;Gait training;Stair training;Functional mobility training;Therapeutic activities;Therapeutic exercise;Balance training;Neuromuscular re-education;Patient/family education;Dry needling;Joint  Manipulations;Spinal Manipulations;Taping    PT Next Visit Plan Assess  repsonse to knee and hip exs.    PT Home Exercise Plan [id number]    Consulted and Agree with Plan of Care Patient             Patient will benefit from skilled therapeutic intervention in order to improve the following deficits and impairments:  Decreased range of motion, Pain, Hypomobility, Decreased knowledge of use of DME, Decreased strength  Visit Diagnosis: Chronic midline low back pain without sciatica  Chronic pain of both knees     Problem List Patient Active Problem List   Diagnosis Date Noted   Migraine 09/01/2020   Therapeutic drug monitoring 07/20/2020   Rectal bleeding 07/20/2020   HIV disease ([place]) 02/16/2020   Healthcare maintenance 02/16/2020   Immunization counseling 02/16/2020   Syphilis 02/16/2020   High risk sexual behavior 02/16/2020   Polyarthralgia 04/23/2019   Prediabetes 04/23/2019   Chronic pain of both knees 03/24/2019   Bilateral hand pain 03/24/2019   Pain of great toe, right 03/24/2019    Gar Ponto MS, PT 12/08/20 9:23 AM   [place] [facility] [address] [place], [place], [address] Phone: [phone]   Fax:  [phone]  Name: Curtis Trevino MRN: [id number] Date of Birth: March 17, 1966

## 2020-12-14 ENCOUNTER — Other Ambulatory Visit: Payer: Self-pay

## 2020-12-14 ENCOUNTER — Encounter: Payer: Self-pay | Admitting: Internal Medicine

## 2020-12-14 ENCOUNTER — Ambulatory Visit (INDEPENDENT_AMBULATORY_CARE_PROVIDER_SITE_OTHER): Payer: BC Managed Care – PPO | Admitting: Internal Medicine

## 2020-12-14 DIAGNOSIS — Z113 Encounter for screening for infections with a predominantly sexual mode of transmission: Secondary | ICD-10-CM

## 2020-12-14 DIAGNOSIS — Z7251 High risk heterosexual behavior: Secondary | ICD-10-CM

## 2020-12-14 DIAGNOSIS — Z7185 Encounter for immunization safety counseling: Secondary | ICD-10-CM

## 2020-12-14 DIAGNOSIS — Z5181 Encounter for therapeutic drug level monitoring: Secondary | ICD-10-CM

## 2020-12-14 DIAGNOSIS — B20 Human immunodeficiency virus [HIV] disease: Secondary | ICD-10-CM

## 2020-12-14 DIAGNOSIS — A539 Syphilis, unspecified: Secondary | ICD-10-CM

## 2020-12-14 MED ORDER — DOVATO 50-300 MG PO TABS: 1.0000 | ORAL_TABLET | Freq: Every day | ORAL | 5 refills | Status: DC

## 2020-12-14 NOTE — Assessment & Plan Note (Signed)
He received his first dose of Monkeypox vaccine a couple weeks ago and will follow up for his second dose to complete vaccine series.

## 2020-12-14 NOTE — Assessment & Plan Note (Signed)
Check creatinine today.

## 2020-12-14 NOTE — Progress Notes (Signed)
[facility] for Infectious Disease   CHIEF COMPLAINT    HIV follow up.   SUBJECTIVE:    Curtis Trevino is a 55 y.o. male with PMHx as below who presents to the clinic for HIV follow up.   Please see A&P for the details of today's visit and status of the patient's medical problems.   Patient's Medications  New Prescriptions   No medications on file  Previous Medications   MELOXICAM (MOBIC) 15 MG TABLET    Take 1 tablet (15 mg total) by mouth daily.   TOPIRAMATE (TOPAMAX) 25 MG TABLET    Take 1 tablet (25 mg total) by mouth 2 (two) times daily.  Modified Medications   Modified Medication Previous Medication   DOLUTEGRAVIR-LAMIVUDINE (DOVATO) 50-300 MG TABS Dolutegravir-lamiVUDine (DOVATO) 50-300 MG TABS      Take 1 tablet by mouth daily.    Take 1 tablet by mouth daily.  Discontinued Medications   No medications on file      Past Medical History:  Diagnosis Date   Acid reflux    Chronic back pain     Social History   Tobacco Use   Smoking status: Former    Types: Cigarettes    Quit date: 11/15/2010    Years since quitting: 10.0   Smokeless tobacco: Never  Substance Use Topics   Alcohol use: Not Currently    Alcohol/week: 3.0 standard drinks    Types: 3 Shots of liquor per week    Comment: 3 drink a day   Drug use: No    Family History  Problem Relation Age of Onset   Diabetes Mother    Heart failure Mother    Cancer Mother    Heart failure Father    Cancer Father     Allergies  Allergen Reactions   Codeine Hives, Itching and Rash   Tylenol [Acetaminophen] Nausea And Vomiting    Vomiting every time he takes tylenol    Review of Systems  Constitutional: Negative.   Respiratory: Negative.    Cardiovascular: Negative.   Genitourinary: Negative.   Musculoskeletal:  Positive for back pain and joint pain.  Skin: Negative.   Neurological:  Positive for headaches.    OBJECTIVE:    Vitals:   12/14/20 0947  BP: 101/71  Pulse: 80   Temp: 98.2 F (36.8 C)  TempSrc: Oral  SpO2: 97%  Weight: 184 lb (83.5 kg)     Body mass index is 27.98 kg/m.  Physical Exam Constitutional:      General: He is not in acute distress.    Appearance: Normal appearance.  HENT:     Head: Normocephalic and atraumatic.  Pulmonary:     Effort: Pulmonary effort is normal. No respiratory distress.  Skin:    General: Skin is warm and dry.     Findings: No rash.  Neurological:     General: No focal deficit present.     Mental Status: He is alert and oriented to person, place, and time.  Psychiatric:        Mood and Affect: Mood normal.        Behavior: Behavior normal.    Labs and Microbiology: CMP Latest Ref Rng & Units 10/13/2020 07/20/2020 04/21/2020  Glucose 65 - 99 mg/dL 108(H) 114(H) 106(H)  BUN 6 - 24 mg/dL '22 21 21  '$ Creatinine 0.76 - 1.27 mg/dL 1.54(H) 1.44(H) 1.49(H)  Sodium 134 - 144 mmol/L 140 141 142  Potassium 3.5 -  5.2 mmol/L 4.7 4.1 3.9  Chloride 96 - 106 mmol/L 102 106 108  CO2 20 - 29 mmol/L '24 25 24  '$ Calcium 8.7 - 10.2 mg/dL 9.4 9.2 9.6  Total Protein 6.1 - 8.1 g/dL - 6.7 7.1  Total Bilirubin 0.2 - 1.2 mg/dL - 0.2 0.4  Alkaline Phos 38 - 126 U/L - - -  AST 10 - 35 U/L - 18 19  ALT 9 - 46 U/L - 16 17   CBC Latest Ref Rng & Units 07/20/2020 04/21/2020 01/30/2020  WBC 3.8 - 10.8 Thousand/uL 6.7 7.1 5.7  Hemoglobin 13.2 - 17.1 g/dL 14.5 14.5 14.0  Hematocrit 38.5 - 50.0 % 44.3 42.7 42.7  Platelets 140 - 400 Thousand/uL 286 285 279     Lab Results  Component Value Date   HIV1RNAQUANT Not Detected 10/27/2020   HIV1RNAQUANT <20 (H) 07/20/2020   HIV1RNAQUANT <20 (H) 04/21/2020   CD4TABS 864 07/20/2020   CD4TABS 819 04/21/2020   CD4TABS 696 01/30/2020    RPR and STI: Lab Results  Component Value Date   LABRPR REACTIVE (A) 10/27/2020   LABRPR REACTIVE (A) 07/20/2020   LABRPR REACTIVE (A) 06/08/2020   LABRPR REACTIVE (A) 04/21/2020   LABRPR REACTIVE (A) 01/30/2020   RPRTITER 1:2 (H) 10/27/2020   RPRTITER  1:2 (H) 07/20/2020   RPRTITER 1:8 (H) 06/08/2020   RPRTITER 1:4 (H) 04/21/2020   RPRTITER 1:4 (H) 01/30/2020    STI Results GC CT  10/27/2020 Negative Negative  10/27/2020 Negative Positive(A)  10/27/2020 Negative Negative  06/08/2020 Negative Negative  06/08/2020 Negative Negative  06/08/2020 Negative Negative  02/16/2020 Negative Negative  02/16/2020 Negative Negative  01/30/2020 Negative Negative  06/18/2019 Negative Negative  06/18/2019 Positive(A) Negative  06/18/2019 Positive(A) Negative  06/13/2018 Negative Negative  06/13/2018 Negative Negative  06/13/2018 Negative Negative  03/18/2018 Negative Negative  03/18/2018 Negative Negative  03/18/2018 **POSITIVE**(A) **POSITIVE**(A)  09/18/2017 Negative Negative  09/18/2017 Negative Negative    Hepatitis B: Lab Results  Component Value Date   HEPBSAB REACTIVE (A) 07/20/2020   HEPBSAG NON-REACTIVE 07/20/2020   HEPBCAB NON-REACTIVE 07/20/2020   Hepatitis C: Lab Results  Component Value Date   HEPCAB NON-REACTIVE 01/30/2020   Hepatitis A: Lab Results  Component Value Date   HAV REACTIVE (A) 01/30/2020   Lipids: Lab Results  Component Value Date   CHOL 191 01/30/2020   TRIG 193 (H) 01/30/2020   HDL 42 01/30/2020   CHOLHDL 4.5 01/30/2020   LDLCALC 118 (H) 01/30/2020     ASSESSMENT & PLAN:    HIV disease ([place]) Doing well on Dovato now since July after switching from [place].  He is tolerating well without any missed doses.  Will continue with Dovato 1 pill daily and send in refills.  Labs today and follow up in 6 months if all looks okay.  Therapeutic drug monitoring Check creatinine today.  High risk sexual behavior Declines STI screening today as he and his partner have not been active.  Will follow up as needed if any new concerns.   Syphilis RPR in July stable at titer 1:2.  No new concerns.   Immunization counseling He received his first dose of Monkeypox vaccine a couple weeks ago and will follow up for his second  dose to complete vaccine series.    Orders Placed This Encounter  Procedures   COMPLETE METABOLIC PANEL WITH GFR   CBC   T-helper cell (CD4)- ([facility] clinic only)   HIV-1 RNA quant-no reflex-bld     Curtis Trevino  [facility] 12/14/2020, 10:56 AM

## 2020-12-14 NOTE — Assessment & Plan Note (Signed)
Declines STI screening today as he and his partner have not been active.  Will follow up as needed if any new concerns.

## 2020-12-14 NOTE — Patient Instructions (Signed)
Thank you for coming to see me today. It was a pleasure seeing you.  To Do: Labs today Continue Dovato and refills sent to pharmacy FOllow up in 6 months, sooner if needed. Get your second dose of Monkeypox when you are eligible.  If you have any questions or concerns, please do not hesitate to call the office at [phone].  Take Care,   Jule Ser

## 2020-12-14 NOTE — Assessment & Plan Note (Signed)
Doing well on Dovato now since July after switching from [place].  He is tolerating well without any missed doses.  Will continue with Dovato 1 pill daily and send in refills.  Labs today and follow up in 6 months if all looks okay.

## 2020-12-14 NOTE — Assessment & Plan Note (Signed)
RPR in July stable at titer 1:2.  No new concerns.

## 2020-12-15 LAB — T-HELPER CELL (CD4) - (RCID CLINIC ONLY)
CD4 % Helper T Cell: 39 % (ref 33–65)
CD4 T Cell Abs: 839 /uL (ref 400–1790)

## 2020-12-16 ENCOUNTER — Telehealth: Payer: Self-pay

## 2020-12-16 ENCOUNTER — Ambulatory Visit: Payer: BC Managed Care – PPO | Attending: Family Medicine

## 2020-12-16 ENCOUNTER — Other Ambulatory Visit: Payer: Self-pay

## 2020-12-16 DIAGNOSIS — M25561 Pain in right knee: Secondary | ICD-10-CM | POA: Diagnosis present

## 2020-12-16 DIAGNOSIS — M545 Low back pain, unspecified: Secondary | ICD-10-CM | POA: Diagnosis not present

## 2020-12-16 DIAGNOSIS — M25562 Pain in left knee: Secondary | ICD-10-CM | POA: Insufficient documentation

## 2020-12-16 DIAGNOSIS — G8929 Other chronic pain: Secondary | ICD-10-CM | POA: Insufficient documentation

## 2020-12-16 LAB — CBC
HCT: 44.7 % (ref 38.5–50.0)
Hemoglobin: 14.7 g/dL (ref 13.2–17.1)
MCH: 28.7 pg (ref 27.0–33.0)
MCHC: 32.9 g/dL (ref 32.0–36.0)
MCV: 87.1 fL (ref 80.0–100.0)
MPV: 11.7 fL (ref 7.5–12.5)
Platelets: 250 10*3/uL (ref 140–400)
RBC: 5.13 10*6/uL (ref 4.20–5.80)
RDW: 14.4 % (ref 11.0–15.0)
WBC: 6.1 10*3/uL (ref 3.8–10.8)

## 2020-12-16 LAB — COMPLETE METABOLIC PANEL WITH GFR
AG Ratio: 1.6 (calc) (ref 1.0–2.5)
ALT: 17 U/L (ref 9–46)
AST: 19 U/L (ref 10–35)
Albumin: 4.5 g/dL (ref 3.6–5.1)
Alkaline phosphatase (APISO): 42 U/L (ref 35–144)
BUN/Creatinine Ratio: 14 (calc) (ref 6–22)
BUN: 21 mg/dL (ref 7–25)
CO2: 26 mmol/L (ref 20–32)
Calcium: 10.1 mg/dL (ref 8.6–10.3)
Chloride: 107 mmol/L (ref 98–110)
Creat: 1.52 mg/dL — ABNORMAL HIGH (ref 0.70–1.30)
Globulin: 2.9 g/dL (calc) (ref 1.9–3.7)
Glucose, Bld: 116 mg/dL — ABNORMAL HIGH (ref 65–99)
Potassium: 4.6 mmol/L (ref 3.5–5.3)
Sodium: 139 mmol/L (ref 135–146)
Total Bilirubin: 0.4 mg/dL (ref 0.2–1.2)
Total Protein: 7.4 g/dL (ref 6.1–8.1)
eGFR: 54 mL/min/{1.73_m2} — ABNORMAL LOW (ref >=60)

## 2020-12-16 LAB — HIV-1 RNA QUANT-NO REFLEX-BLD
HIV 1 RNA Quant: NOT DETECTED Copies/mL
HIV-1 RNA Quant, Log: NOT DETECTED Log cps/mL

## 2020-12-16 NOTE — Telephone Encounter (Signed)
MyChart message sent.   Beryle Flock, RN

## 2020-12-16 NOTE — Telephone Encounter (Signed)
-----   Message from Mignon Pine, DO sent at 12/16/2020  8:43 AM EDT ----- Can you please let pt know his labs look stable.  HIV VL is undetectable and CD4 count healthy.  His creatinine is at its baseline from last several measurements.  Thanks

## 2020-12-16 NOTE — Therapy (Addendum)
[place], [place], [address] Phone: [phone]   Fax:  [phone]  Physical Therapy Treatment/Discharge  Patient Details  Name: Curtis Trevino MRN: [id number] Date of Birth: Nov 04, 1965 Referring Provider (PT): [name], MD   Encounter Date: 12/16/2020   PT End of Session - 12/16/20 0839     Visit Number 7    Number of Visits 12    Date for PT Re-Evaluation 12/21/20    Authorization Type BCBS 6th visit FOTO, 10th visit FOTO    PT Start Time 0803    PT Stop Time 0847    PT Time Calculation (min) 44 min    Activity Tolerance Patient tolerated treatment well    Behavior During Therapy [facility] for tasks assessed/performed             Past Medical History:  Diagnosis Date   Acid reflux    Chronic back pain     Past Surgical History:  Procedure Laterality Date   CHOLECYSTECTOMY N/A 11/18/2013   Procedure: LAPAROSCOPIC CHOLECYSTECTOMY;  Surgeon: Scherry Ran, MD;  Location: [facility];  Service: General;  Laterality: N/A;   ESOPHAGOGASTRODUODENOSCOPY  2005   SCROTAL EXPLORATION Left 06/23/2016   Procedure: LEFT SCROTAL EXPLORATION WITH EXCISION OF EPIDIDYMAL MASS;  Surgeon: Irine Seal, MD;  Location: [facility];  Service: Urology;  Laterality: Left;    There were no vitals filed for this visit.   Subjective Assessment - 12/16/20 0813     Subjective Pt reports he is not experiencing low back or L hip pain. The R knee continues to be an issue.    Limitations Lifting;House hold activities;Other (comment)    Diagnostic tests [id number]:  1. Unchanged small intradural nodule at L1-2, indeterminate but  possibly a dermoid or epidermoid. The lack of enhancement makes a  nerve sheath tumor or other neoplasm much less likely.  2. Unchanged lumbar disc and facet degeneration with mild lateral  recess stenosis at L3-4.    Patient Stated Goals To have less pain and be able to pick up his year old.     Currently in Pain? Yes    Pain Score 2     Pain Location Knee    Pain Orientation Right    Pain Descriptors / Indicators Throbbing    Pain Type Chronic pain    Pain Onset More than a month ago    Pain Frequency Constant    Pain Score 0    Pain Location Hip    Pain Orientation Left    Pain Descriptors / Indicators Sharp    Pain Type Acute pain    Pain Onset More than a month ago    Pain Frequency Intermittent    Aggravating Factors  going up and down steps                               [facility] Adult PT Treatment/Exercise - 12/16/20 0001       Exercises   Exercises Lumbar;Knee/Hip      Lumbar Exercises: Stretches   Passive Hamstring Stretch Left;Right;2 reps;30 seconds    Passive Hamstring Stretch Limitations supine c strap    Quad Stretch Right;Left;2 reps;20 seconds    Quad Stretch Limitations prone with strap    ITB Stretch Right;Left;2 reps;20 seconds    ITB Stretch Limitations supine c strap    Piriformis Stretch Right;Left;2 reps;20 seconds  Lumbar Exercises: Supine   Clam 15 reps   3 sec   Clam Limitations Blue TB    Bridge 15 reps    Bridge Limitations with ab set prior    Straight Leg Raise 15 reps   L 2 sets and R 1 set   Straight Leg Raises Limitations with PPT 3 sec hold    Other Supine Lumbar Exercises Hip add sets c ball, 15x, 3 sec      Lumbar Exercises: Sidelying   Hip Abduction Right;Left;15 reps   2 sets   Hip Abduction Weights (lbs) 3   L                      PT Short Term Goals - 11/25/20 5409       PT SHORT TERM GOAL #1   Title The patient will be independent in a basic HEP    Baseline no HEP    Time 2    Period Weeks    Status Achieved    Target Date 11/23/20               PT Long Term Goals - 12/16/20 1049       PT LONG TERM GOAL #1   Title The patient will have an improved FOTO score to 54% for functional activities. 12/16/20: 72% functional ability    Baseline 37%    Status Achieved     Target Date 12/16/20                   Plan - 12/16/20 8119     Clinical Impression Statement FOTO was reassessed with pt's score of 72% functional ability exceeding the predicted 54%. Pt's subjective report indicates improvement in his L hip pain which has become more apparent, esp. with up/down steps. R knee pain continues to be a concern for the pt with his chronic Hx of R knee pain. PT was continued for lumbopelvic/hip flexibility and strengthening. Will continue to monitor pt's response to current program re: R knee and L hip pain and his function ability, and re-assess next week to detemine course of care, DC vs. additional PT. Pt tolerated today's PT session without adverse effects.    Personal Factors and Comorbidities Comorbidity 1    Comorbidities Bilateral knee arthritis    Examination-Activity Limitations Caring for Others;Carry;Lift;Transfers    Examination-Participation Restrictions Cleaning;Meal Prep;Occupation;Shop;Laundry    Stability/Clinical Decision Making Stable/Uncomplicated    Clinical Decision Making Low    Rehab Potential Good    PT Frequency 2x / week    PT Duration 6 weeks    PT Treatment/Interventions ADLs/Self Care Home Management;Aquatic Therapy;Traction;Gait training;Stair training;Functional mobility training;Therapeutic activities;Therapeutic exercise;Balance training;Neuromuscular re-education;Patient/family education;Dry needling;Joint Manipulations;Spinal Manipulations;Taping    PT Next Visit Plan Continue knee and hip exs    PT Home Exercise Plan [id number]    Consulted and Agree with Plan of Care Patient             Patient will benefit from skilled therapeutic intervention in order to improve the following deficits and impairments:  Decreased range of motion, Pain, Hypomobility, Decreased knowledge of use of DME, Decreased strength  Visit Diagnosis: Chronic midline low back pain without sciatica  Chronic pain of both knees     Problem  List Patient Active Problem List   Diagnosis Date Noted   Migraine 09/01/2020   Therapeutic drug monitoring 07/20/2020   Rectal bleeding 07/20/2020   HIV disease ([place]) 02/16/2020   Healthcare maintenance  02/16/2020   Immunization counseling 02/16/2020   Syphilis 02/16/2020   High risk sexual behavior 02/16/2020   Polyarthralgia 04/23/2019   Prediabetes 04/23/2019   Chronic pain of both knees 03/24/2019   Bilateral hand pain 03/24/2019   Pain of great toe, right 03/24/2019    Gar Ponto MS, PT 12/16/20 11:00 AM   [place], [place], [address] Phone: [phone]   Fax:  [phone]  Name: Curtis Trevino MRN: [id number] Date of Birth: 1965-06-17  PHYSICAL THERAPY DISCHARGE SUMMARY  Visits from Start of Care: 7  Current functional level related to goals / functional outcomes: Unknown, pt not returning less the last session    Remaining deficits: Unknown, pt not returning less the last session   Education / Equipment: HEP  Patient agrees to discharge. Patient goals were partially met. Patient is being discharged due to not returning since the last visit.  Haely Leyland MS, PT 03/02/21 11:02 AM

## 2020-12-17 ENCOUNTER — Ambulatory Visit: Payer: BC Managed Care – PPO

## 2020-12-21 ENCOUNTER — Ambulatory Visit: Payer: BC Managed Care – PPO | Admitting: Physical Therapy

## 2020-12-22 ENCOUNTER — Other Ambulatory Visit: Payer: Self-pay | Admitting: Family Medicine

## 2020-12-22 DIAGNOSIS — G8929 Other chronic pain: Secondary | ICD-10-CM

## 2020-12-27 ENCOUNTER — Ambulatory Visit (INDEPENDENT_AMBULATORY_CARE_PROVIDER_SITE_OTHER): Payer: BC Managed Care – PPO | Admitting: Family Medicine

## 2020-12-27 ENCOUNTER — Other Ambulatory Visit: Payer: Self-pay

## 2020-12-27 VITALS — Ht 68.5 in | Wt 184.0 lb

## 2020-12-27 DIAGNOSIS — M1711 Unilateral primary osteoarthritis, right knee: Secondary | ICD-10-CM | POA: Diagnosis not present

## 2020-12-27 MED ORDER — DICLOFENAC SODIUM 1 % EX GEL: 4.0000 g | Freq: Four times a day (QID) | CUTANEOUS | 4 refills | Status: DC

## 2020-12-27 MED ORDER — MELOXICAM 15 MG PO TABS: 15.0000 mg | ORAL_TABLET | Freq: Every day | ORAL | 2 refills | Status: DC

## 2020-12-27 NOTE — Patient Instructions (Signed)
We will refer you to Dr. Ninfa Linden since your knee is not responding to conservative treatment. Continue the meloxicam. Voltaren gel to the hands topically as needed. Try boswellia extract - you can get this at [facility]. Continue the home exercises for your back - this should improve over the next several days. Follow up with me as needed depending on what Dr. Ninfa Linden says.

## 2020-12-27 NOTE — Progress Notes (Signed)
PCP: [name], MD  Subjective:   HPI: Patient is a 55 y.o. male here for right knee pain.  Patient reports despite physical therapy and home exercises he continues to struggle with knee pain that feels deep to his patella. No new injuries or trauma. Injections previously done of his knee and baker's cyst (after aspiration) only provided transient relief, less than a month. MRI of his knee in June with extensive cartilage loss of the patellofemoral compartment, only mild degenerative changes of medial and lateral compartments. He has been taking meloxicam though ran out on Thursday and noticably increase in his pain. + swelling.  Past Medical History:  Diagnosis Date   Acid reflux    Chronic back pain     Current Outpatient Medications on File Prior to Visit  Medication Sig Dispense Refill   Dolutegravir-lamiVUDine (DOVATO) 50-300 MG TABS Take 1 tablet by mouth daily. 30 tablet 5   topiramate (TOPAMAX) 25 MG tablet Take 1 tablet (25 mg total) by mouth 2 (two) times daily. 60 tablet 3   [DISCONTINUED] cetirizine (ZYRTEC) 10 MG tablet Take 1 tablet (10 mg total) by mouth daily. 30 tablet 0   [DISCONTINUED] omeprazole (PRILOSEC OTC) 20 MG tablet Take 20 mg by mouth at bedtime.      No current facility-administered medications on file prior to visit.    Past Surgical History:  Procedure Laterality Date   CHOLECYSTECTOMY N/A 11/18/2013   Procedure: LAPAROSCOPIC CHOLECYSTECTOMY;  Surgeon: Scherry Ran, MD;  Location: [facility];  Service: General;  Laterality: N/A;   ESOPHAGOGASTRODUODENOSCOPY  2005   SCROTAL EXPLORATION Left 06/23/2016   Procedure: LEFT SCROTAL EXPLORATION WITH EXCISION OF EPIDIDYMAL MASS;  Surgeon: Irine Seal, MD;  Location: [facility];  Service: Urology;  Laterality: Left;    Allergies  Allergen Reactions   Codeine Hives, Itching and Rash   Tylenol [Acetaminophen] Nausea And Vomiting    Vomiting every time he takes tylenol    Ht 5' 8.5" (1.74 m)   Wt 184  lb (83.5 kg)   BMI 27.57 kg/m   No flowsheet data found.  No flowsheet data found.      Objective:  Physical Exam:  Gen: NAD, comfortable in exam room  Right knee: Moderate effusion. No other gross deformity, ecchymoses. + crepitus TTP posterior patellar facets. No medial or lateral joint line tenderness. ROM 0 - 120 degrees with normal strength flexion and extension Negative ant/post drawers. Negative valgus/varus testing. Negative lachman. Negative mcmurrays, apleys. NV intact distally.   Assessment & Plan:  1. Right knee pain - consistent with pain due to his patellofemoral arthropathy.  Continue meloxicam. Will not repeat injection today.  Try boswellia extract.  Continue home exercises; has done extensive physical therapy as well for patellar tracking.  Will refer to orthopedic surgery to discuss surgical options.  F/u with [place] prn.

## 2020-12-28 ENCOUNTER — Ambulatory Visit: Payer: BC Managed Care – PPO

## 2021-01-12 ENCOUNTER — Other Ambulatory Visit: Payer: Self-pay

## 2021-01-12 ENCOUNTER — Ambulatory Visit (INDEPENDENT_AMBULATORY_CARE_PROVIDER_SITE_OTHER): Payer: BC Managed Care – PPO | Admitting: Orthopaedic Surgery

## 2021-01-12 ENCOUNTER — Telehealth: Payer: Self-pay | Admitting: Orthopaedic Surgery

## 2021-01-12 DIAGNOSIS — M1711 Unilateral primary osteoarthritis, right knee: Secondary | ICD-10-CM | POA: Diagnosis not present

## 2021-01-12 DIAGNOSIS — M222X1 Patellofemoral disorders, right knee: Secondary | ICD-10-CM | POA: Diagnosis not present

## 2021-01-12 DIAGNOSIS — M25561 Pain in right knee: Secondary | ICD-10-CM

## 2021-01-12 DIAGNOSIS — G8929 Other chronic pain: Secondary | ICD-10-CM

## 2021-01-12 NOTE — Progress Notes (Signed)
Office Visit Note   Patient: Curtis Trevino           Date of Birth: 02-Jan-1966           MRN: [id number] Visit Date: 01/12/2021              Requested by: Curtis Trevino [address] [place],  [place] [address] PCP: Curtis Trevino   Assessment & Plan: Visit Diagnoses:  1. Unilateral primary osteoarthritis, right knee   2. Chronic pain of right knee   3. Patellofemoral pain syndrome of right knee     Plan: I would like to send him to my partner Curtis Trevino to assess his knee to make a determination of whether or not he would benefit from a patellofemoral arthroplasty for the right knee.  The patient agrees with that referral.  Follow-Up Instructions: No follow-ups on file.   Orders:  No orders of the defined types were placed in this encounter.  No orders of the defined types were placed in this encounter.     Procedures: No procedures performed   Clinical Data: No additional findings.   Subjective: Chief Complaint  Patient presents with   Right Knee - Pain  The patient is a 55 year old sent from Curtis Trevino to evaluate and treat patellofemoral arthritis of his right knee.  He has had other conservative treatment measures including steroid injections and anti-inflammatories.  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  His insurance would not cover hyaluronic acid.  His pain is daily and is becoming more chronic and consistent in his right knee.  He has grinding that is worrisome in that knee he states.  However he does have patellofemoral grinding in the left knee but is not symptomatic the way the right knee is.  He is on his feet all day long.  He is a thin individual not a diabetic.  He is hoping to have some type of surgical intervention given the severity of his pain and how this is detrimentally affecting his mobility, his quality of life and his actives daily living.  HPI  Review of  Systems There is currently listed no headache, chest pain, shortness of breath, fever, chills, nausea, vomiting  Objective: Vital Signs: There were no vitals taken for this visit.  Physical Exam He is alert and orient x3 and in no acute distress Ortho Exam Both knees were examined and have patellofemoral crepitation.  It is definitely worse on the right side than the left knee is way more symptomatic with pain on the right knee.  His knee is ligamentously stable and no significant medial lateral joint line tenderness.  His range of motion is full but very painful throughout the arc of motion.  If he is sitting and goes to standing position he does get significant right knee pain. Specialty Comments:  No specialty comments available.  Imaging: No results found. The MRI is independently reviewed of his right knee and I did go over this with him.  He does have significant patellofemoral changes.  His plain films on the sunrise view shows the patella tracks laterally as well.  There is edema in the lateral patella facet and trochlear groove.  The medial and lateral compartments of the knee are well-maintained.  His ACL PCL are intact.  There is no meniscal tearing.  PMFS History: Patient Active Problem List   Diagnosis Date Noted   Migraine 09/01/2020  Therapeutic drug monitoring 07/20/2020   Rectal bleeding 07/20/2020   HIV disease ([place]) 02/16/2020   Healthcare maintenance 02/16/2020   Immunization counseling 02/16/2020   Syphilis 02/16/2020   High risk sexual behavior 02/16/2020   Polyarthralgia 04/23/2019   Prediabetes 04/23/2019   Chronic pain of both knees 03/24/2019   Bilateral hand pain 03/24/2019   Pain of great toe, right 03/24/2019   Past Medical History:  Diagnosis Date   Acid reflux    Chronic back pain     Family History  Problem Relation Age of Onset   Diabetes Mother    Heart failure Mother    Cancer Mother    Heart failure Father    Cancer Father     Past  Surgical History:  Procedure Laterality Date   CHOLECYSTECTOMY N/A 11/18/2013   Procedure: LAPAROSCOPIC CHOLECYSTECTOMY;  Surgeon: Scherry Ran, Trevino;  Location: [facility];  Service: General;  Laterality: N/A;   ESOPHAGOGASTRODUODENOSCOPY  2005   SCROTAL EXPLORATION Left 06/23/2016   Procedure: LEFT SCROTAL EXPLORATION WITH EXCISION OF EPIDIDYMAL MASS;  Surgeon: Curtis Trevino;  Location: [facility];  Service: Urology;  Laterality: Left;   Social History   Occupational History   Not on file  Tobacco Use   Smoking status: Former    Types: Cigarettes    Quit date: 11/15/2010    Years since quitting: 10.1   Smokeless tobacco: Never  Substance and Sexual Activity   Alcohol use: Not Currently    Alcohol/week: 3.0 standard drinks    Types: 3 Shots of liquor per week    Comment: 3 drink a day   Drug use: No   Sexual activity: Yes    Partners: Male    Comment: declined condoms

## 2021-01-12 NOTE — Telephone Encounter (Signed)
Please advise of work status and/or restrictions. Please provide note stating such if any. Thanks.

## 2021-01-20 ENCOUNTER — Ambulatory Visit (INDEPENDENT_AMBULATORY_CARE_PROVIDER_SITE_OTHER): Payer: BC Managed Care – PPO | Admitting: Orthopedic Surgery

## 2021-01-20 ENCOUNTER — Other Ambulatory Visit: Payer: Self-pay

## 2021-01-20 DIAGNOSIS — M1711 Unilateral primary osteoarthritis, right knee: Secondary | ICD-10-CM

## 2021-01-21 ENCOUNTER — Encounter: Payer: Self-pay | Admitting: Orthopedic Surgery

## 2021-01-21 NOTE — Progress Notes (Signed)
Office Visit Note   Patient: Curtis Trevino           Date of Birth: [birth year]           MRN: [id number] Visit Date: 01/20/2021 Requested by: [name], MD [place],  [place] [address] PCP: [name], MD  Subjective: Chief Complaint  Patient presents with   Knee Pain    HPI: Curtis Trevino is a 55 year old patient with right knee pain.  He has had pain for 10 years.  Denies any history of injury.  He has had no prior patellar dislocation.  He states he has global pain in the right knee.  Also reports "3 disc in his back".  Going up and down stairs it is more painful than straight flat ground walking.  Has taken Mobic in the past.  Patient also has HIV which is well controlled.  He has 3 sons at home with him he would like to be more active.  MRI scan is reviewed and shows primarily severe patellofemoral arthritis; however, malalignment is present with increased tibial tubercle trochlear groove distance noted on axial views.              ROS: All systems reviewed are negative as they relate to the chief complaint within the history of present illness.  Patient denies  fevers or chills.   Assessment & Plan: Visit Diagnoses:  1. Unilateral primary osteoarthritis, right knee     Plan: Impression is right knee pain with primarily patellofemoral arthritis.  The patella is tracking significantly laterally and has been doing so for many years.  Patellofemoral arthroplasty in this circumstance would require tibial tubercle osteotomy.  I think his better option would be press-fit total knee replacement with femoral component rotation to compensate for malalignment.  I think that would be a more predictable operation for him with equal longevity.  We will see him back as needed.  Follow-Up Instructions: No follow-ups on file.   Orders:  No orders of the defined types were placed in this encounter.  No orders of the defined types were placed in this encounter.      Procedures: No procedures performed   Clinical Data: No additional findings.  Objective: Vital Signs: There were no vitals taken for this visit.  Physical Exam:   Constitutional: Patient appears well-developed HEENT:  Head: Normocephalic Eyes:EOM are normal Neck: Normal range of motion Cardiovascular: Normal rate Pulmonary/chest: Effort normal Neurologic: Patient is alert Skin: Skin is warm Psychiatric: Patient has normal mood and affect   Ortho Exam: Ortho exam demonstrates significant patellofemoral crepitus right more than left.  No groin pain with internal ex rotation of the right leg.  Pedal pulses palpable.  Range of motion is full.  No patellar apprehension is present.  No increase in Q angle.  No definite effusion.  Specialty Comments:  No specialty comments available.  Imaging: No results found.   PMFS History: Patient Active Problem List   Diagnosis Date Noted   Migraine 09/01/2020   Therapeutic drug monitoring 07/20/2020   Rectal bleeding 07/20/2020   HIV disease ([place]) 02/16/2020   Healthcare maintenance 02/16/2020   Immunization counseling 02/16/2020   Syphilis 02/16/2020   High risk sexual behavior 02/16/2020   Polyarthralgia 04/23/2019   Prediabetes 04/23/2019   Chronic pain of both knees 03/24/2019   Bilateral hand pain 03/24/2019   Pain of great toe, right 03/24/2019   Past Medical History:  Diagnosis Date   Acid reflux  Chronic back pain     Family History  Problem Relation Age of Onset   Diabetes Mother    Heart failure Mother    Cancer Mother    Heart failure Father    Cancer Father     Past Surgical History:  Procedure Laterality Date   CHOLECYSTECTOMY N/A 11/18/2013   Procedure: LAPAROSCOPIC CHOLECYSTECTOMY;  Surgeon: Scherry Ran, MD;  Location: [facility];  Service: General;  Laterality: N/A;   ESOPHAGOGASTRODUODENOSCOPY  2005   SCROTAL EXPLORATION Left 06/23/2016   Procedure: LEFT SCROTAL EXPLORATION WITH EXCISION OF  EPIDIDYMAL MASS;  Surgeon: Irine Seal, MD;  Location: [facility];  Service: Urology;  Laterality: Left;   Social History   Occupational History   Not on file  Tobacco Use   Smoking status: Former    Types: Cigarettes    Quit date: 11/15/2010    Years since quitting: 10.1   Smokeless tobacco: Never  Substance and Sexual Activity   Alcohol use: Not Currently    Alcohol/week: 3.0 standard drinks    Types: 3 Shots of liquor per week    Comment: 3 drink a day   Drug use: No   Sexual activity: Yes    Partners: Male    Comment: declined condoms

## 2021-01-25 ENCOUNTER — Telehealth: Payer: Self-pay | Admitting: Orthopedic Surgery

## 2021-01-25 NOTE — Telephone Encounter (Signed)
Patient called. He would like to go ahead with surgery. His cb# is [phone]

## 2021-01-27 NOTE — Telephone Encounter (Signed)
IC and discussed all details as per below with patient. He stated he wanted to proceed with Dr Marlou Sa doing the surgery.

## 2021-01-27 NOTE — Telephone Encounter (Signed)
Hi Curtis Trevino.  Can you call Terrel and tell him we are happy to do it but he is actually  Chris's patient and Gerald Stabs really has right of first refusal for this total knee.Thanks

## 2021-01-28 ENCOUNTER — Telehealth: Payer: Self-pay | Admitting: Orthopedic Surgery

## 2021-01-28 NOTE — Telephone Encounter (Signed)
Blue sheet done

## 2021-01-31 ENCOUNTER — Telehealth: Payer: Self-pay | Admitting: Orthopedic Surgery

## 2021-01-31 NOTE — Telephone Encounter (Signed)
[employer] forms received. To [employer]. 

## 2021-02-01 ENCOUNTER — Telehealth: Payer: Self-pay | Admitting: Orthopedic Surgery

## 2021-02-01 NOTE — Telephone Encounter (Signed)
I called patient regarding right total knee arthroplasty.  Patient is employed with [employer] and has insurance coverage that requires his total knee surgery be done at a [facility] in order to maximize his benefits and not have to pay for being out of network. Patient has decided he will have his surgery done in [place].  He spoke with an associate in the benefits department and was told no exceptions can be done at this time, and only in the case of Covid would there be exceptions made in the furture.

## 2021-02-02 NOTE — Telephone Encounter (Signed)
Patient can only be done at [facility] in [place].  How about that.

## 2021-02-28 ENCOUNTER — Telehealth: Payer: Self-pay

## 2021-02-28 NOTE — Telephone Encounter (Signed)
[facility] in [place] does not take patient's Blue medicare supplement and Medicare red/white/blue. He would like to be referred to a rehab that will accept his insurances.

## 2021-03-01 NOTE — Telephone Encounter (Signed)
I talked with patient and PT and this is an error. Dr Marlou Sa has not referred patient to PT

## 2021-03-24 ENCOUNTER — Encounter: Payer: Self-pay | Admitting: Urology

## 2021-03-24 ENCOUNTER — Ambulatory Visit (INDEPENDENT_AMBULATORY_CARE_PROVIDER_SITE_OTHER): Payer: BC Managed Care – PPO | Admitting: Urology

## 2021-03-24 ENCOUNTER — Other Ambulatory Visit: Payer: Self-pay

## 2021-03-24 VITALS — BP 122/76 | HR 90

## 2021-03-24 DIAGNOSIS — N4341 Spermatocele of epididymis, single: Secondary | ICD-10-CM | POA: Diagnosis not present

## 2021-03-24 DIAGNOSIS — N401 Enlarged prostate with lower urinary tract symptoms: Secondary | ICD-10-CM

## 2021-03-24 DIAGNOSIS — N50812 Left testicular pain: Secondary | ICD-10-CM | POA: Diagnosis not present

## 2021-03-24 DIAGNOSIS — R3915 Urgency of urination: Secondary | ICD-10-CM

## 2021-03-24 DIAGNOSIS — N138 Other obstructive and reflux uropathy: Secondary | ICD-10-CM

## 2021-03-24 DIAGNOSIS — R3912 Poor urinary stream: Secondary | ICD-10-CM

## 2021-03-24 LAB — URINALYSIS, ROUTINE W REFLEX MICROSCOPIC
Bilirubin, UA: NEGATIVE
Glucose, UA: NEGATIVE
Ketones, UA: NEGATIVE
Leukocytes,UA: NEGATIVE
Nitrite, UA: NEGATIVE
Protein,UA: NEGATIVE
RBC, UA: NEGATIVE
Specific Gravity, UA: 1.02 (ref 1.005–1.030)
Urobilinogen, Ur: 0.2 mg/dL (ref 0.2–1.0)
pH, UA: 7 (ref 5.0–7.5)

## 2021-03-24 LAB — BLADDER SCAN AMB NON-IMAGING: Scan Result: 4

## 2021-03-24 MED ORDER — TAMSULOSIN HCL 0.4 MG PO CAPS: 0.4000 mg | ORAL_CAPSULE | Freq: Every day | ORAL | 11 refills | Status: DC

## 2021-03-24 NOTE — Progress Notes (Signed)
Subjective: 1. Pain in left testicle   2. Spermatocele of epididymis, single   3. BPH with urinary obstruction   4. Urgency of urination   5. Weak urinary stream      Curtis Trevino returns today in f/u for a recurrent left testicular mass and pain.  He had a adenomatoid tumor removed in 3/18.   He reports some progressive enlargement of a mass at the top of the testicle over the last 6 months and it is tender when touched.  He also has significant LUTS with frequency, urgency and a reduced stream.   He has no dysuria or hematuria.  His UA today is clear.  He did have chlamydia several months ago.  He has not had PSA testing.   ROS:  ROS  Allergies  Allergen Reactions   Codeine Hives, Itching and Rash   Tylenol [Acetaminophen] Nausea And Vomiting    Vomiting every time he takes tylenol    Past Medical History:  Diagnosis Date   Acid reflux    Chronic back pain     Past Surgical History:  Procedure Laterality Date   CHOLECYSTECTOMY N/A 11/18/2013   Procedure: LAPAROSCOPIC CHOLECYSTECTOMY;  Surgeon: Scherry Ran, MD;  Location: [facility];  Service: General;  Laterality: N/A;   ESOPHAGOGASTRODUODENOSCOPY  2005   SCROTAL EXPLORATION Left 06/23/2016   Procedure: LEFT SCROTAL EXPLORATION WITH EXCISION OF EPIDIDYMAL MASS;  Surgeon: Irine Seal, MD;  Location: [facility];  Service: Urology;  Laterality: Left;    Social History   Socioeconomic History   Marital status: Single    Spouse name: Not on file   Number of children: Not on file   Years of education: Not on file   Highest education level: Not on file  Occupational History   Not on file  Tobacco Use   Smoking status: Former    Types: Cigarettes    Quit date: 11/15/2010    Years since quitting: 10.3   Smokeless tobacco: Never  Substance and Sexual Activity   Alcohol use: Not Currently    Alcohol/week: 3.0 standard drinks    Types: 3 Shots of liquor per week    Comment: 3 drink a day   Drug use: No   Sexual activity: Yes     Partners: Male    Comment: declined condoms  Other Topics Concern   Not on file  Social History Narrative   Not on file   Social Determinants of Health   Financial Resource Strain: Not on file  Food Insecurity: Not on file  Transportation Needs: Not on file  Physical Activity: Not on file  Stress: Not on file  Social Connections: Not on file  Intimate Partner Violence: Not on file    Family History  Problem Relation Age of Onset   Diabetes Mother    Heart failure Mother    Cancer Mother    Heart failure Father    Cancer Father     Anti-infectives: Anti-infectives (From admission, onward)    None       Current Outpatient Medications  Medication Sig Dispense Refill   diclofenac Sodium (VOLTAREN) 1 % GEL Apply 4 g topically 4 (four) times daily. 150 g 4   Dolutegravir-lamiVUDine (DOVATO) 50-300 MG TABS Take 1 tablet by mouth daily. 30 tablet 5   meloxicam (MOBIC) 15 MG tablet Take 1 tablet (15 mg total) by mouth daily. 30 tablet 2   tamsulosin (FLOMAX) 0.4 MG CAPS capsule Take 1 capsule (0.4 mg total) by mouth daily.  30 capsule 11   topiramate (TOPAMAX) 25 MG tablet Take 1 tablet (25 mg total) by mouth 2 (two) times daily. 60 tablet 3   No current facility-administered medications for this visit.     Objective: Vital signs in last 24 hours: BP 122/76    Pulse 90   Intake/Output from previous day: No intake/output data recorded. Intake/Output this shift: @IOTHISSHIFT @   Physical Exam Vitals reviewed.  Constitutional:      Appearance: Normal appearance.  Genitourinary:    Comments: Normal phallus with adequate meatus. Scrotum is normal. Testes are normal. There is 60mm cystic lesion at the head of the left epididymis that is tender.  AP without lesions. NST without mass. Prostate 2+ benign. SV non-palpable.  Neurological:     Mental Status: He is alert.    Lab Results:  Results for orders placed or performed in visit on 03/24/21 (from the past 24  hour(s))  Urinalysis, Routine w reflex microscopic     Status: None   Collection Time: 03/24/21  4:21 PM  Result Value Ref Range   Specific Gravity, UA 1.020 1.005 - 1.030   pH, UA 7.0 5.0 - 7.5   Color, UA Yellow Yellow   Appearance Ur Clear Clear   Leukocytes,UA Negative Negative   Protein,UA Negative Negative/Trace   Glucose, UA Negative Negative   Ketones, UA Negative Negative   RBC, UA Negative Negative   Bilirubin, UA Negative Negative   Urobilinogen, Ur 0.2 0.2 - 1.0 mg/dL   Nitrite, UA Negative Negative   Microscopic Examination Comment    Narrative   Performed at:  [facility] [address], [place], [place]  [id number] Lab Director: [place], Phone:  [phone]    BMET No results for input(s): NA, K, CL, CO2, GLUCOSE, BUN, CREATININE, CALCIUM in the last 72 hours. PT/INR No results for input(s): LABPROT, INR in the last 72 hours. ABG No results for input(s): PHART, HCO3 in the last 72 hours.  Invalid input(s): PCO2, PO2  Studies/Results: No results found.   Assessment/Plan: Left testicular mass.  The mass is most consistent with a spermatocele but I will get an [place] to confirmed.  BPH with LUTS with urgency and a reduced stream.   I will get a PSA today and get her started on tamsulosin.  He will return in 6 wks for a flowrate.     Meds ordered this encounter  Medications   tamsulosin (FLOMAX) 0.4 MG CAPS capsule    Sig: Take 1 capsule (0.4 mg total) by mouth daily.    Dispense:  30 capsule    Refill:  11     Orders Placed This Encounter  Procedures   US SCROTUM W/DOPPLER    Standing Status:   Future    Standing Expiration Date:   03/24/2022    Order Specific Question:   Reason for Exam (SYMPTOM  OR DIAGNOSIS REQUIRED)    Answer:   left epididymal mass.    Order Specific Question:   Preferred imaging location?    Answer:   [facility]   Urinalysis, Routine w reflex microscopic   PSA, total and free   Bladder scan    BLADDER SCAN AMB NON-IMAGING     Return for Next available with [place] results for a flow rate. .    CC: [name] MD.      Irine Seal 03/24/2021 [phone]

## 2021-03-24 NOTE — Progress Notes (Signed)
Urological Symptom Review  Patient is experiencing the following symptoms: Frequent urination Hard to postpone urination Get up at night to urinate   Review of Systems  Gastrointestinal (upper)  : Negative for upper GI symptoms  Gastrointestinal (lower) : Negative for lower GI symptoms  Constitutional : Negative for symptoms  Skin: Negative for skin symptoms  Eyes: Negative for eye symptoms  Ear/Nose/Throat : Negative for Ear/Nose/Throat symptoms  Hematologic/Lymphatic: Negative for Hematologic/Lymphatic symptoms  Cardiovascular : Negative for cardiovascular symptoms  Respiratory : Negative for respiratory symptoms  Endocrine: Negative for endocrine symptoms  Musculoskeletal: Negative for musculoskeletal symptoms  Neurological: Negative for neurological symptoms  Psychologic: Negative for psychiatric symptoms  

## 2021-03-31 ENCOUNTER — Other Ambulatory Visit: Payer: Self-pay | Admitting: Family Medicine

## 2021-03-31 DIAGNOSIS — G43809 Other migraine, not intractable, without status migrainosus: Secondary | ICD-10-CM

## 2021-04-07 ENCOUNTER — Other Ambulatory Visit: Payer: Self-pay

## 2021-04-07 ENCOUNTER — Ambulatory Visit (HOSPITAL_COMMUNITY)
Admission: RE | Admit: 2021-04-07 | Discharge: 2021-04-07 | Disposition: A | Discharge status: Home / Self Care | Payer: BC Managed Care – PPO | Source: Ambulatory Visit | Attending: Urology | Admitting: Urology

## 2021-04-07 DIAGNOSIS — N50812 Left testicular pain: Secondary | ICD-10-CM | POA: Diagnosis not present

## 2021-04-07 DIAGNOSIS — N4341 Spermatocele of epididymis, single: Secondary | ICD-10-CM | POA: Diagnosis present

## 2021-04-12 ENCOUNTER — Telehealth: Payer: Self-pay

## 2021-04-12 NOTE — Telephone Encounter (Signed)
Patient was called and notified. He was understanding.

## 2021-04-12 NOTE — Telephone Encounter (Signed)
-----   Message from Irine Seal, MD sent at 04/08/2021 10:03 AM EST ----- He has a small cyst on the left epididymis which is  benign finding and needs no intervention.   ----- Message ----- From: Clearence Cheek, CMA Sent: 04/08/2021   9:59 AM EST To: Irine Seal, MD  Please review

## 2021-04-13 ENCOUNTER — Telehealth: Payer: BC Managed Care – PPO | Admitting: Nurse Practitioner

## 2021-04-13 ENCOUNTER — Other Ambulatory Visit: Payer: Self-pay

## 2021-04-13 DIAGNOSIS — U071 COVID-19: Secondary | ICD-10-CM

## 2021-04-13 DIAGNOSIS — M545 Low back pain, unspecified: Secondary | ICD-10-CM

## 2021-04-13 MED ORDER — MOLNUPIRAVIR EUA 200MG CAPSULE: 4.0000 | ORAL_CAPSULE | Freq: Two times a day (BID) | ORAL | 0 refills | Status: AC

## 2021-04-13 NOTE — Progress Notes (Signed)
Virtual Visit Consent   Curtis Trevino, you are scheduled for a virtual visit with a [facility] provider today.     Just as with appointments in the office, your consent must be obtained to participate.  Your consent will be active for this visit and any virtual visit you may have with one of our providers in the next 365 days.     If you have a MyChart account, a copy of this consent can be sent to you electronically.  All virtual visits are billed to your insurance company just like a traditional visit in the office.    As this is a virtual visit, video technology does not allow for your provider to perform a traditional examination.  This may limit your provider's ability to fully assess your condition.  If your provider identifies any concerns that need to be evaluated in person or the need to arrange testing (such as labs, EKG, etc.), we will make arrangements to do so.     Although advances in technology are sophisticated, we cannot ensure that it will always work on either your end or our end.  If the connection with a video visit is poor, the visit may have to be switched to a telephone visit.  With either a video or telephone visit, we are not always able to ensure that we have a secure connection.     I need to obtain your verbal consent now.   Are you willing to proceed with your visit today?    Curtis Trevino has provided verbal consent on 04/13/2021 for a virtual visit (video or telephone).   Apolonio Schneiders, FNP   Date: 04/13/2021 6:47 PM   Virtual Visit via Video Note   I, Apolonio Schneiders, connected with  Curtis Trevino  ([id number], 07/02/1965) on 04/13/21 at  7:00 PM EST by a video-enabled telemedicine application and verified that I am speaking with the correct person using two identifiers.  Location: Patient: Virtual Visit Location Patient: Home Provider: Virtual Visit Location Provider: Home Office   I discussed the limitations of evaluation and management by  telemedicine and the availability of in person appointments. The patient expressed understanding and agreed to proceed.    History of Present Illness: Curtis Trevino is a 56 y.o. who identifies as a male who was assigned male at birth, and is being seen today after testing positive for COVID.  Patient started to have symptoms yesterday, took a home test today and was positive for COVID. Symptoms include some body aches and nasal congestion.   He has had COVID in the past without complications He has been vaccinated including most recent booster   Denies a history of asthma He was a smoker in the past quit 9 years ago.   Problems:  Patient Active Problem List   Diagnosis Date Noted   Migraine 09/01/2020   Therapeutic drug monitoring 07/20/2020   Rectal bleeding 07/20/2020   HIV disease ([place]) 02/16/2020   Healthcare maintenance 02/16/2020   Immunization counseling 02/16/2020   Syphilis 02/16/2020   High risk sexual behavior 02/16/2020   Polyarthralgia 04/23/2019   Prediabetes 04/23/2019   Chronic pain of both knees 03/24/2019   Bilateral hand pain 03/24/2019   Pain of great toe, right 03/24/2019    Allergies:  Allergies  Allergen Reactions   Codeine Hives, Itching and Rash   Tylenol [Acetaminophen] Nausea And Vomiting    Vomiting every time he takes tylenol   Medications:  Current Outpatient Medications:  diclofenac Sodium (VOLTAREN) 1 % GEL, Apply 4 g topically 4 (four) times daily., Disp: 150 g, Rfl: 4   Dolutegravir-lamiVUDine (DOVATO) 50-300 MG TABS, Take 1 tablet by mouth daily., Disp: 30 tablet, Rfl: 5   meloxicam (MOBIC) 15 MG tablet, Take 1 tablet by mouth once daily, Disp: 30 tablet, Rfl: 0   tamsulosin (FLOMAX) 0.4 MG CAPS capsule, Take 1 capsule (0.4 mg total) by mouth daily., Disp: 30 capsule, Rfl: 11   topiramate (TOPAMAX) 25 MG tablet, Take 1 tablet by mouth twice daily, Disp: 60 tablet, Rfl: 2  Observations/Objective: Patient is well-developed,  well-nourished in no acute distress.  Resting comfortably at home.  Head is normocephalic, atraumatic.  No labored breathing.  Speech is clear and coherent with logical content.  Patient is alert and oriented at baseline.    1. COVID-19 Take anti-viral with food  - molnupiravir EUA (LAGEVRIO) 200 mg CAPS capsule; Take 4 capsules (800 mg total) by mouth 2 (two) times daily for 5 days.  Dispense: 40 capsule; Refill: 0     Continue to manage with OTC medications for symptoms as discussed  GFR 54   Follow Up Instructions: I discussed the assessment and treatment plan with the patient. The patient was provided an opportunity to ask questions and all were answered. The patient agreed with the plan and demonstrated an understanding of the instructions.  A copy of instructions were sent to the patient via MyChart unless otherwise noted below.     The patient was advised to call back or seek an in-person evaluation if the symptoms worsen or if the condition fails to improve as anticipated.  Time:  I spent 15 minutes with the patient via telehealth technology discussing the above problems/concerns.    Apolonio Schneiders, FNP

## 2021-04-13 NOTE — Progress Notes (Signed)
Since your COVID-19 symptoms started less than 5 days ago you may need a prescription for FDA-approved treatments (pills or IV therapy), which we cannot prescribe through an e-Visit. The best way for our providers to make a decision about which COVID treatment is right for you is through a Virtual Urgent Care Visit.  If you would like to discuss COVID therapy options with a provider, cancel this e-Visit and access a Virtual Urgent Care Visit from our [facility] menu.   You will not be charged for the e-Visit.

## 2021-04-13 NOTE — Progress Notes (Signed)
Pt called requesting a referral to an orthopedic for his chronic back pain.

## 2021-04-20 ENCOUNTER — Other Ambulatory Visit: Payer: Self-pay | Admitting: Orthopaedic Surgery

## 2021-04-20 DIAGNOSIS — M1711 Unilateral primary osteoarthritis, right knee: Secondary | ICD-10-CM

## 2021-04-26 ENCOUNTER — Other Ambulatory Visit (HOSPITAL_COMMUNITY): Payer: Self-pay

## 2021-04-26 ENCOUNTER — Ambulatory Visit (INDEPENDENT_AMBULATORY_CARE_PROVIDER_SITE_OTHER): Payer: BC Managed Care – PPO | Admitting: Internal Medicine

## 2021-04-26 ENCOUNTER — Encounter: Payer: Self-pay | Admitting: Internal Medicine

## 2021-04-26 ENCOUNTER — Other Ambulatory Visit: Payer: Self-pay

## 2021-04-26 DIAGNOSIS — U071 COVID-19: Secondary | ICD-10-CM | POA: Diagnosis not present

## 2021-04-26 DIAGNOSIS — B20 Human immunodeficiency virus [HIV] disease: Secondary | ICD-10-CM | POA: Diagnosis not present

## 2021-04-26 DIAGNOSIS — M25562 Pain in left knee: Secondary | ICD-10-CM

## 2021-04-26 DIAGNOSIS — M25561 Pain in right knee: Secondary | ICD-10-CM

## 2021-04-26 DIAGNOSIS — G8929 Other chronic pain: Secondary | ICD-10-CM

## 2021-04-26 DIAGNOSIS — Z7251 High risk heterosexual behavior: Secondary | ICD-10-CM | POA: Diagnosis not present

## 2021-04-26 NOTE — Assessment & Plan Note (Signed)
Here for HIV follow-up.  He is currently on Dovato and doing well with good viral suppression.  He had a couple missed doses last month when he was at the beach and forgot medications at home but otherwise maintains good adherence.  He is concerned about weight gain due to Dovato (particularly abdominal fat) however upon review of his weights for the past 2 years this has been stable.  He is also concerned about fatigue but does report not getting as good a nights rest as possible.    He was last seen in September 2022 at which time his viral load was undetectable and CD4 count was 839.  He is interested in switching to [place].  Discussed the switch and required follow up which he is interested in doing.  Will have pharmacy look into insurance coverage and schedule him for injections if covered.

## 2021-04-26 NOTE — Assessment & Plan Note (Signed)
Planning for knee replacement soon in [place] for knee OA.

## 2021-04-26 NOTE — Assessment & Plan Note (Signed)
Patient recently tested positive for COVID earlier this month.  He took home test that was positive and symptoms consisted of body aches and nasal congestion.  He had previously been vaccinated and reports that he had received his booster.  He was treated with molnupiravir x5 days and is back to his baseline.

## 2021-04-26 NOTE — Progress Notes (Signed)
[facility]   CHIEF COMPLAINT    HIV follow up.    Chief Complaint  Patient presents with   Follow-up    Fatigue and "belly fat pain"       SUBJECTIVE:    Curtis Trevino is a 56 y.o. male with PMHx as below who presents to the clinic for HIV follow up.   Please see A&P for the details of today's visit and status of the patient's medical problems.   Patient's Medications  New Prescriptions   No medications on file  Previous Medications   DICLOFENAC SODIUM (VOLTAREN) 1 % GEL    Apply 4 g topically 4 (four) times daily.   DOLUTEGRAVIR-LAMIVUDINE (DOVATO) 50-300 MG TABS    Take 1 tablet by mouth daily.   MELOXICAM (MOBIC) 15 MG TABLET    Take 1 tablet by mouth once daily   TAMSULOSIN (FLOMAX) 0.4 MG CAPS CAPSULE    Take 1 capsule (0.4 mg total) by mouth daily.   TOPIRAMATE (TOPAMAX) 25 MG TABLET    Take 1 tablet by mouth twice daily  Modified Medications   No medications on file  Discontinued Medications   No medications on file      Past Medical History:  Diagnosis Date   Acid reflux    Chronic back pain     Social History   Tobacco Use   Smoking status: Former    Types: Cigarettes    Quit date: 11/15/2010    Years since quitting: 10.4   Smokeless tobacco: Never  Substance Use Topics   Alcohol use: Not Currently    Alcohol/week: 3.0 standard drinks    Types: 3 Shots of liquor per week    Comment: 3 drink a day   Drug use: No    Family History  Problem Relation Age of Onset   Diabetes Mother    Heart failure Mother    Cancer Mother    Heart failure Father    Cancer Father     Allergies  Allergen Reactions   Codeine Hives, Itching and Rash   Tylenol [Acetaminophen] Nausea And Vomiting    Vomiting every time he takes tylenol    Review of Systems  All other systems reviewed and are negative.  Except as noted below.   OBJECTIVE:    Vitals:   04/26/21 1342  BP: 131/80  Pulse: 85  Temp: 98.2 F (36.8 C)   TempSrc: Temporal  SpO2: 97%  Weight: 186 lb (84.4 kg)     Body mass index is 27.87 kg/m.  Physical Exam Constitutional:      Appearance: Normal appearance.  HENT:     Head: Normocephalic and atraumatic.  Eyes:     Extraocular Movements: Extraocular movements intact.     Conjunctiva/sclera: Conjunctivae normal.  Pulmonary:     Effort: Pulmonary effort is normal. No respiratory distress.  Musculoskeletal:        General: Normal range of motion.  Skin:    General: Skin is warm and dry.  Neurological:     General: No focal deficit present.     Mental Status: He is alert and oriented to person, place, and time.  Psychiatric:        Mood and Affect: Mood normal.        Behavior: Behavior normal.    Labs and Microbiology: CMP Latest Ref Rng & Units 12/14/2020 10/13/2020 07/20/2020  Glucose 65 - 99 mg/dL 116(H) 108(H) 114(H)  BUN 7 - 25 mg/dL 21 22 21   Creatinine 0.70 - 1.30 mg/dL 1.52(H) 1.54(H) 1.44(H)  Sodium 135 - 146 mmol/L 139 140 141  Potassium 3.5 - 5.3 mmol/L 4.6 4.7 4.1  Chloride 98 - 110 mmol/L 107 102 106  CO2 20 - 32 mmol/L 26 24 25   Calcium 8.6 - 10.3 mg/dL 10.1 9.4 9.2  Total Protein 6.1 - 8.1 g/dL 7.4 - 6.7  Total Bilirubin 0.2 - 1.2 mg/dL 0.4 - 0.2  Alkaline Phos 38 - 126 U/L - - -  AST 10 - 35 U/L 19 - 18  ALT 9 - 46 U/L 17 - 16   CBC Latest Ref Rng & Units 12/14/2020 07/20/2020 04/21/2020  WBC 3.8 - 10.8 Thousand/uL 6.1 6.7 7.1  Hemoglobin 13.2 - 17.1 g/dL 14.7 14.5 14.5  Hematocrit 38.5 - 50.0 % 44.7 44.3 42.7  Platelets 140 - 400 Thousand/uL 250 286 285     Lab Results  Component Value Date   HIV1RNAQUANT Not Detected 12/14/2020   HIV1RNAQUANT Not Detected 10/27/2020   HIV1RNAQUANT <20 (H) 07/20/2020   CD4TABS 839 12/14/2020   CD4TABS 864 07/20/2020   CD4TABS 819 04/21/2020    RPR and STI: Lab Results  Component Value Date   LABRPR REACTIVE (A) 10/27/2020   LABRPR REACTIVE (A) 07/20/2020   LABRPR REACTIVE (A) 06/08/2020   LABRPR REACTIVE (A)  04/21/2020   LABRPR REACTIVE (A) 01/30/2020   RPRTITER 1:2 (H) 10/27/2020   RPRTITER 1:2 (H) 07/20/2020   RPRTITER 1:8 (H) 06/08/2020   RPRTITER 1:4 (H) 04/21/2020   RPRTITER 1:4 (H) 01/30/2020    STI Results GC CT  10/27/2020 Negative Negative  10/27/2020 Negative Positive(A)  10/27/2020 Negative Negative  06/08/2020 Negative Negative  06/08/2020 Negative Negative  06/08/2020 Negative Negative  02/16/2020 Negative Negative  02/16/2020 Negative Negative  01/30/2020 Negative Negative  06/18/2019 Negative Negative  06/18/2019 Positive(A) Negative  06/18/2019 Positive(A) Negative  06/13/2018 Negative Negative  06/13/2018 Negative Negative  06/13/2018 Negative Negative  03/18/2018 Negative Negative  03/18/2018 Negative Negative  03/18/2018 **POSITIVE**(A) **POSITIVE**(A)  09/18/2017 Negative Negative  09/18/2017 Negative Negative    Hepatitis B: Lab Results  Component Value Date   HEPBSAB REACTIVE (A) 07/20/2020   HEPBSAG NON-REACTIVE 07/20/2020   HEPBCAB NON-REACTIVE 07/20/2020   Hepatitis C: Lab Results  Component Value Date   HEPCAB NON-REACTIVE 01/30/2020   Hepatitis A: Lab Results  Component Value Date   HAV REACTIVE (A) 01/30/2020   Lipids: Lab Results  Component Value Date   CHOL 191 01/30/2020   TRIG 193 (H) 01/30/2020   HDL 42 01/30/2020   CHOLHDL 4.5 01/30/2020   LDLCALC 118 (H) 01/30/2020     ASSESSMENT & PLAN:    HIV disease ([place]) Here for HIV follow-up.  He is currently on Dovato and doing well with good viral suppression.  He had a couple missed doses last month when he was at the beach and forgot medications at home but otherwise maintains good adherence.  He is concerned about weight gain due to Dovato (particularly abdominal fat) however upon review of his weights for the past 2 years this has been stable.  He is also concerned about fatigue but does report not getting as good a nights rest as possible.    He was last seen in September 2022 at which time his viral  load was undetectable and CD4 count was 839.  He is interested in switching to [place].  Discussed the switch and required follow up which he is interested  in doing.  Will have pharmacy look into insurance coverage and schedule him for injections if covered.   COVID Patient recently tested positive for COVID earlier this month.  He took home test that was positive and symptoms consisted of body aches and nasal congestion.  He had previously been vaccinated and reports that he had received his booster.  He was treated with molnupiravir x5 days and is back to his baseline.  High risk sexual behavior Declines screening today.  Chronic pain of both knees Planning for knee replacement soon in [place] for knee OA.    Rayn[facility] 04/26/2021, 2:23 PM

## 2021-04-26 NOTE — Assessment & Plan Note (Signed)
Declines screening today.  

## 2021-04-26 NOTE — Patient Instructions (Signed)
Thank you for coming to see me today. It was a pleasure seeing you.  To Do: Continue Dovato for now Cassie will be in touch regarding getting started on Cabenuva Follow up as planned  If you have any questions or concerns, please do not hesitate to call the office at [phone].  Take Care,   Jule Ser

## 2021-04-27 ENCOUNTER — Other Ambulatory Visit (HOSPITAL_COMMUNITY): Payer: Self-pay

## 2021-04-27 ENCOUNTER — Other Ambulatory Visit: Payer: BC Managed Care – PPO

## 2021-04-28 ENCOUNTER — Other Ambulatory Visit: Payer: Self-pay | Admitting: Family Medicine

## 2021-04-28 ENCOUNTER — Other Ambulatory Visit (HOSPITAL_COMMUNITY): Payer: Self-pay

## 2021-04-28 ENCOUNTER — Telehealth: Payer: Self-pay

## 2021-04-28 ENCOUNTER — Telehealth: Payer: Self-pay | Admitting: Pharmacist

## 2021-04-28 ENCOUNTER — Encounter: Payer: Self-pay | Admitting: Pharmacist

## 2021-04-28 DIAGNOSIS — B20 Human immunodeficiency virus [HIV] disease: Secondary | ICD-10-CM

## 2021-04-28 MED ORDER — CABOTEGRAVIR & RILPIVIRINE ER 600 & 900 MG/3ML IM SUER
1.0000 | INTRAMUSCULAR | 5 refills | Status: DC
  Filled 2021-04-28: qty 6, 60d supply, fill #0
  Filled 2021-07-22: qty 6, 30d supply, fill #0
  Filled 2021-09-19 – 2021-09-20 (×2): qty 6, 30d supply, fill #1
  Filled 2021-11-14: qty 6, 30d supply, fill #2

## 2021-04-28 MED ORDER — CABOTEGRAVIR & RILPIVIRINE ER 600 & 900 MG/3ML IM SUER
1.0000 | INTRAMUSCULAR | 1 refills | Status: DC
  Filled 2021-04-28 (×2): qty 6, 30d supply, fill #0
  Filled 2021-05-24: qty 6, 30d supply, fill #1

## 2021-04-28 NOTE — Telephone Encounter (Signed)
Patient scheduled with me on Monday. Thanks!

## 2021-04-28 NOTE — Telephone Encounter (Signed)
[facility] Patient Advocate Encounter   Received notification from [employer] that prior authorization for [name] is required.   PA submitted on 04/28/21 Key [id number] Status is pending    [facility] will continue to follow.   [name], [facility] Patient [facility] Phone: [phone] Fax:  [phone]

## 2021-04-28 NOTE — Telephone Encounter (Signed)
Sent patient a MyChart message inquiring further about interest. Will send script in once he replies. Thanks!

## 2021-04-28 NOTE — Telephone Encounter (Signed)
Curtis Trevino is approved. Patient will come on Monday 1/23 at 845am to get first injection with me.  Curtis Trevino, PharmD [facility] Clinical Pharmacist Practitioner

## 2021-04-28 NOTE — Telephone Encounter (Signed)
[facility] Patient Advocate Encounter  Prior Authorization for [name] has been approved.    PA# [id number] Effective dates: 04/28/21 through 04/28/22  Patients co-pay is $0.00.   Prescription can be sent to [facility] .  [facility] will continue to follow.  [name], [facility] Patient [facility] Phone: [phone] Fax:  [phone]

## 2021-04-29 ENCOUNTER — Telehealth: Payer: Self-pay

## 2021-04-29 NOTE — Telephone Encounter (Signed)
[facility] Patient Advocate Encounter  Patient's medication(cabenuva) have been couriered to [facility] from [facility] and will be administered on patient next office visit on 05/02/20.  [name] , [facility] Patient [facility] for Infectious Disease Phone: [phone] Fax:  [phone]

## 2021-04-29 NOTE — Telephone Encounter (Signed)
Per Dr. Barbaraann Barthel - since we haven't seen him since September of 2022 and he has been seeing an orthopedist for his knee and will be seeing Dr. [name] for his back, he needs to have one of them prescribe this medication as he is no longer under our care.

## 2021-04-30 ENCOUNTER — Other Ambulatory Visit: Payer: Self-pay | Admitting: Family Medicine

## 2021-05-02 ENCOUNTER — Ambulatory Visit (INDEPENDENT_AMBULATORY_CARE_PROVIDER_SITE_OTHER): Payer: BC Managed Care – PPO | Admitting: Pharmacist

## 2021-05-02 ENCOUNTER — Other Ambulatory Visit: Payer: Self-pay

## 2021-05-02 DIAGNOSIS — B20 Human immunodeficiency virus [HIV] disease: Secondary | ICD-10-CM

## 2021-05-02 MED ORDER — CABOTEGRAVIR & RILPIVIRINE ER 600 & 900 MG/3ML IM SUER
1.0000 | Freq: Once | INTRAMUSCULAR | Status: AC
  Administered 2021-05-02: 1 via INTRAMUSCULAR

## 2021-05-02 NOTE — Progress Notes (Signed)
HPI: Curtis Trevino is a 56 y.o. male who presents to the [facility] clinic for [place] administration.  Patient Active Problem List   Diagnosis Date Noted   COVID 04/26/2021   Migraine 09/01/2020   Therapeutic drug monitoring 07/20/2020   Rectal bleeding 07/20/2020   HIV disease ([place]) 02/16/2020   Healthcare maintenance 02/16/2020   Immunization counseling 02/16/2020   Syphilis 02/16/2020   High risk sexual behavior 02/16/2020   Polyarthralgia 04/23/2019   Prediabetes 04/23/2019   Chronic pain of both knees 03/24/2019   Bilateral hand pain 03/24/2019   Pain of great toe, right 03/24/2019    Patient's Medications  New Prescriptions   No medications on file  Previous Medications   CABOTEGRAVIR & RILPIVIRINE ER (CABENUVA) 600 & 900 MG/3ML INJECTION    Inject 1 kit into the muscle every 30 (thirty) days.   CABOTEGRAVIR & RILPIVIRINE ER (CABENUVA) 600 & 900 MG/3ML INJECTION    Inject 1 kit into the muscle every 2 (two) months.   DICLOFENAC SODIUM (VOLTAREN) 1 % GEL    Apply 4 g topically 4 (four) times daily.   DOLUTEGRAVIR-LAMIVUDINE (DOVATO) 50-300 MG TABS    Take 1 tablet by mouth daily.   MELOXICAM (MOBIC) 15 MG TABLET    Take 1 tablet by mouth once daily   TAMSULOSIN (FLOMAX) 0.4 MG CAPS CAPSULE    Take 1 capsule (0.4 mg total) by mouth daily.   TOPIRAMATE (TOPAMAX) 25 MG TABLET    Take 1 tablet by mouth twice daily  Modified Medications   No medications on file  Discontinued Medications   No medications on file    Allergies: Allergies  Allergen Reactions   Codeine Hives, Itching and Rash   Tylenol [Acetaminophen] Nausea And Vomiting    Vomiting every time he takes tylenol    Past Medical History: Past Medical History:  Diagnosis Date   Acid reflux    Chronic back pain     Social History: Social History   Socioeconomic History   Marital status: Single    Spouse name: Not on file   Number of children: Not on file   Years of education: Not on file    Highest education level: Not on file  Occupational History   Not on file  Tobacco Use   Smoking status: Former    Types: Cigarettes    Quit date: 11/15/2010    Years since quitting: 10.4   Smokeless tobacco: Never  Substance and Sexual Activity   Alcohol use: Not Currently    Alcohol/week: 3.0 standard drinks    Types: 3 Shots of liquor per week    Comment: 3 drink a day   Drug use: No   Sexual activity: Yes    Partners: Male    Comment: declined condoms  Other Topics Concern   Not on file  Social History Narrative   Not on file   Social Determinants of Health   Financial Resource Strain: Not on file  Food Insecurity: Not on file  Transportation Needs: Not on file  Physical Activity: Not on file  Stress: Not on file  Social Connections: Not on file    Labs: Lab Results  Component Value Date   HIV1RNAQUANT Not Detected 12/14/2020   HIV1RNAQUANT Not Detected 10/27/2020   HIV1RNAQUANT <20 (H) 07/20/2020   CD4TABS 839 12/14/2020   CD4TABS 864 07/20/2020   CD4TABS 819 04/21/2020    RPR and STI Lab Results  Component Value Date   LABRPR REACTIVE (A)  10/27/2020   LABRPR REACTIVE (A) 07/20/2020   LABRPR REACTIVE (A) 06/08/2020   LABRPR REACTIVE (A) 04/21/2020   LABRPR REACTIVE (A) 01/30/2020   RPRTITER 1:2 (H) 10/27/2020   RPRTITER 1:2 (H) 07/20/2020   RPRTITER 1:8 (H) 06/08/2020   RPRTITER 1:4 (H) 04/21/2020   RPRTITER 1:4 (H) 01/30/2020    STI Results GC CT  10/27/2020 Negative Negative  10/27/2020 Negative Positive(A)  10/27/2020 Negative Negative  06/08/2020 Negative Negative  06/08/2020 Negative Negative  06/08/2020 Negative Negative  02/16/2020 Negative Negative  02/16/2020 Negative Negative  01/30/2020 Negative Negative  06/18/2019 Negative Negative  06/18/2019 Positive(A) Negative  06/18/2019 Positive(A) Negative  06/13/2018 Negative Negative  06/13/2018 Negative Negative  06/13/2018 Negative Negative  03/18/2018 Negative Negative  03/18/2018 Negative Negative   03/18/2018 **POSITIVE**(A) **POSITIVE**(A)  09/18/2017 Negative Negative  09/18/2017 Negative Negative    Hepatitis B Lab Results  Component Value Date   HEPBSAB REACTIVE (A) 07/20/2020   HEPBSAG NON-REACTIVE 07/20/2020   HEPBCAB NON-REACTIVE 07/20/2020   Hepatitis C Lab Results  Component Value Date   HEPCAB NON-REACTIVE 01/30/2020   Hepatitis A Lab Results  Component Value Date   HAV REACTIVE (A) 01/30/2020   Lipids: Lab Results  Component Value Date   CHOL 191 01/30/2020   TRIG 193 (H) 01/30/2020   HDL 42 01/30/2020   CHOLHDL 4.5 01/30/2020   LDLCALC 118 (H) 01/30/2020    Current HIV Regimen: Dovato  TARGET DATE: The 23rd of the month  Assessment: Curtis Trevino presents today for their first initiation injection for Cabenuva. Counseled that [place] is two separate intramuscular injections in the gluteal muscle on each side for each visit. Explained that the second injection is 30 days after the initial injection then every 2 months thereafter. Discussed the need for viral load monitoring every 2 months for the first 6 months and then periodically afterwards as their provider sees the need. Discussed the rare but significant chance of developing resistance despite compliance. Explained that showing up to injection appointments is very important and warned that if 2 appointments are missed, it will be reassessed by their provider whether they are a good candidate for injection therapy. Counseled on possible side effects associated with the injections such as injection site pain, which is usually mild to moderate in nature, injection site nodules, and injection site reactions. Asked to call the clinic or send me a mychart message if they experience any issues, such as fatigue, nausea, headache, rash, or dizziness. Advised that they can take ibuprofen or tylenol for injection site pain if needed. Up to date on vaccines. Will check HIV RNA, last one was in September  2022.  Administered cabotegravir 633m/3mL in left upper outer quadrant of the gluteal muscle. Administered rilpivirine 900 mg/321min the right upper outer quadrant of the gluteal muscle. Monitored patient for 10 minutes after injection. Injections were tolerated well without issue. Counseled to stop taking Dovato after today's dose and to call with any issues that may arise. Will make follow up appointments for second initiation injection in 30 days and then maintenance injections every 2 months thereafter for 6 months.   Plan: - Stop Dovato - Will check HIV RNA - First Cabenuva injections administered - Second initiation injection scheduled for 06/02/21 - Maintenance injections scheduled for 08/02/21 - Call with any issues or questions  YuMichela PitcherChMaple BluffPharmD Student

## 2021-05-04 LAB — HIV-1 RNA QUANT-NO REFLEX-BLD
HIV 1 RNA Quant: NOT DETECTED Copies/mL
HIV-1 RNA Quant, Log: NOT DETECTED Log cps/mL

## 2021-05-05 ENCOUNTER — Other Ambulatory Visit: Payer: Self-pay | Admitting: Internal Medicine

## 2021-05-05 DIAGNOSIS — Z5181 Encounter for therapeutic drug level monitoring: Secondary | ICD-10-CM

## 2021-05-05 DIAGNOSIS — A539 Syphilis, unspecified: Secondary | ICD-10-CM

## 2021-05-05 DIAGNOSIS — B20 Human immunodeficiency virus [HIV] disease: Secondary | ICD-10-CM

## 2021-05-05 DIAGNOSIS — Z7251 High risk heterosexual behavior: Secondary | ICD-10-CM

## 2021-05-05 DIAGNOSIS — Z113 Encounter for screening for infections with a predominantly sexual mode of transmission: Secondary | ICD-10-CM

## 2021-05-12 ENCOUNTER — Ambulatory Visit: Payer: BC Managed Care – PPO | Admitting: Urology

## 2021-05-24 ENCOUNTER — Other Ambulatory Visit (HOSPITAL_COMMUNITY): Payer: Self-pay

## 2021-05-25 ENCOUNTER — Ambulatory Visit: Payer: Self-pay

## 2021-05-25 ENCOUNTER — Other Ambulatory Visit: Payer: Self-pay

## 2021-05-25 ENCOUNTER — Telehealth: Payer: Self-pay

## 2021-05-25 ENCOUNTER — Encounter: Payer: Self-pay | Admitting: Specialist

## 2021-05-25 ENCOUNTER — Ambulatory Visit (INDEPENDENT_AMBULATORY_CARE_PROVIDER_SITE_OTHER): Payer: BC Managed Care – PPO | Admitting: Specialist

## 2021-05-25 VITALS — BP 118/81 | HR 82 | Ht 68.5 in | Wt 189.2 lb

## 2021-05-25 DIAGNOSIS — M5135 Other intervertebral disc degeneration, thoracolumbar region: Secondary | ICD-10-CM

## 2021-05-25 DIAGNOSIS — M1711 Unilateral primary osteoarthritis, right knee: Secondary | ICD-10-CM

## 2021-05-25 DIAGNOSIS — M1712 Unilateral primary osteoarthritis, left knee: Secondary | ICD-10-CM | POA: Diagnosis not present

## 2021-05-25 DIAGNOSIS — M545 Low back pain, unspecified: Secondary | ICD-10-CM | POA: Diagnosis not present

## 2021-05-25 DIAGNOSIS — M25559 Pain in unspecified hip: Secondary | ICD-10-CM | POA: Diagnosis not present

## 2021-05-25 MED ORDER — DICLOFENAC SODIUM 1 % EX GEL: 4.0000 g | Freq: Four times a day (QID) | CUTANEOUS | 3 refills | Status: DC

## 2021-05-25 MED ORDER — DULOXETINE HCL 30 MG PO CPEP: 30.0000 mg | ORAL_CAPSULE | Freq: Every day | ORAL | 3 refills | Status: DC

## 2021-05-25 NOTE — Progress Notes (Signed)
Office Visit Note   Patient: Curtis Trevino           Date of Birth: Mar 16, 1966           MRN: [id number] Visit Date: 05/25/2021              Requested by: [name], MD [address] [place],  [place] [address] PCP: [name], MD   Assessment & Plan: Visit Diagnoses:  1. Low back pain, unspecified back pain laterality, unspecified chronicity, unspecified whether sciatica present     Plan: Avoid frequent bending and stooping  No lifting greater than 10 lbs. May use ice or moist heat for pain. Weight loss is of benefit. Best medication for lumbar disc disease is arthritis medications like tylenol arthritis, or cymbalta or duloxitine Exercise is important to improve your indurance and does allow people to function better inspite of back pain. Knee is suffering from osteoarthritis, only real proven treatments are Weight loss, NSIADs you should not take due to Stage 3 renal changes and exercise. Well padded shoes help. Ice the knee that is suffering from osteoarthritis, only real proven treatments are  Ice the knee 2-3 times a day 15-20 mins at a time. 3 times a day 15-20 mins at a time. Hot showers in the AM.  Injection with steroid may be of benefit. Hemp CBD capsules, amazon.com 5,000-7,000 mg per bottle, 60 capsules per bottle, take one capsule twice a day. Cane in the side hand to use with the most pain with leg weight bearing. Start Duloxetine Start voltaren gel or diclofenac gel 3-4 times per day Should stop using all NSAIDs. Follow-Up Instructions: No follow-ups on file.   Follow-Up Instructions: Return in about 6 weeks (around 07/06/2021).   Orders:  Orders Placed This Encounter  Procedures   XR Lumbar Spine 2-3 Views   No orders of the defined types were placed in this encounter.     Procedures: No procedures performed   Clinical Data: No additional findings.   Subjective: Chief Complaint  Patient presents with   Lower Back - New  Patient (Initial Visit)    56 year old male with history of back pain and left hip and left thigh pain. He has long term pain with walking and standing in one place. Sitting does not improve the pain and lying on the left side. He is not exercising due to areas of pain knees right greater than left and left hip. Sitting, lying and walking too long increase back pain. He takes meloxicam and that is of benefit. Kidney function  at the level.    Review of Systems   Objective: Vital Signs: BP 118/81 (BP Location: Left Arm, Patient Position: Sitting, Cuff Size: Large)    Pulse 82    Ht 5' 8.5" (1.74 m)    Wt 189 lb 3.2 oz (85.8 kg)    SpO2 96%    BMI 28.35 kg/m   Physical Exam  Ortho Exam  Specialty Comments:  No specialty comments available.  Imaging: No results found.   PMFS History: Patient Active Problem List   Diagnosis Date Noted   COVID 04/26/2021   Migraine 09/01/2020   Therapeutic drug monitoring 07/20/2020   Rectal bleeding 07/20/2020   HIV disease ([place]) 02/16/2020   Healthcare maintenance 02/16/2020   Immunization counseling 02/16/2020   Syphilis 02/16/2020   High risk sexual behavior 02/16/2020   Polyarthralgia 04/23/2019   Prediabetes 04/23/2019   Chronic pain of both knees 03/24/2019  Bilateral hand pain 03/24/2019   Pain of great toe, right 03/24/2019   Past Medical History:  Diagnosis Date   Acid reflux    Chronic back pain     Family History  Problem Relation Age of Onset   Diabetes Mother    Heart failure Mother    Cancer Mother    Heart failure Father    Cancer Father     Past Surgical History:  Procedure Laterality Date   CHOLECYSTECTOMY N/A 11/18/2013   Procedure: LAPAROSCOPIC CHOLECYSTECTOMY;  Surgeon: Scherry Ran, MD;  Location: [facility];  Service: General;  Laterality: N/A;   ESOPHAGOGASTRODUODENOSCOPY  2005   SCROTAL EXPLORATION Left 06/23/2016   Procedure: LEFT SCROTAL EXPLORATION WITH EXCISION OF EPIDIDYMAL MASS;  Surgeon: Irine Seal, MD;  Location: [facility];  Service: Urology;  Laterality: Left;   Social History   Occupational History   Not on file  Tobacco Use   Smoking status: Former    Types: Cigarettes    Quit date: 11/15/2010    Years since quitting: 10.5   Smokeless tobacco: Never  Substance and Sexual Activity   Alcohol use: Not Currently    Alcohol/week: 3.0 standard drinks    Types: 3 Shots of liquor per week    Comment: 3 drink a day   Drug use: No   Sexual activity: Yes    Partners: Male    Comment: declined condoms

## 2021-05-25 NOTE — Patient Instructions (Addendum)
Avoid frequent bending and stooping  No lifting greater than 10 lbs. May use ice or moist heat for pain. Weight loss is of benefit. Best medication for lumbar disc disease is arthritis medications like tylenol arthritis, or cymbalta or duloxitine Exercise is important to improve your indurance and does allow people to function better inspite of back pain. Knee is suffering from osteoarthritis, only real proven treatments are Weight loss, NSIADs you should not take due to Stage 3 renal changes and exercise. Well padded shoes help. Ice the knee that is suffering from osteoarthritis, only real proven treatments are  Ice the knee 2-3 times a day 15-20 mins at a time. 3 times a day 15-20 mins at a time. Hot showers in the AM.  Injection with steroid may be of benefit. Hemp CBD capsules, amazon.com 5,000-7,000 mg per bottle, 60 capsules per bottle, take one capsule twice a day. Cane in the side hand to use with the most pain with leg weight bearing. Start Duloxetine Start voltaren gel or diclofenac gel 3-4 times per day Should stop using all NSAIDs. Follow-Up Instructions: No follow-ups on file.

## 2021-05-25 NOTE — Telephone Encounter (Signed)
[facility] Patient Advocate Encounter  Patient's medication Curtis Trevino) have been couriered to [facility] from [employer] and will be administered on patient next office visit on 06/02/21.  [name] , [facility] Patient [facility] Phone: [phone] Fax:  [phone]

## 2021-05-30 ENCOUNTER — Other Ambulatory Visit: Payer: Self-pay | Admitting: Family Medicine

## 2021-05-30 DIAGNOSIS — G43809 Other migraine, not intractable, without status migrainosus: Secondary | ICD-10-CM

## 2021-06-02 ENCOUNTER — Encounter: Payer: BC Managed Care – PPO | Admitting: Pharmacist

## 2021-06-02 ENCOUNTER — Other Ambulatory Visit: Payer: Self-pay

## 2021-06-02 ENCOUNTER — Ambulatory Visit (INDEPENDENT_AMBULATORY_CARE_PROVIDER_SITE_OTHER): Payer: BC Managed Care – PPO | Admitting: Pharmacist

## 2021-06-02 DIAGNOSIS — B20 Human immunodeficiency virus [HIV] disease: Secondary | ICD-10-CM

## 2021-06-02 MED ORDER — CABOTEGRAVIR & RILPIVIRINE ER 600 & 900 MG/3ML IM SUER
1.0000 | Freq: Once | INTRAMUSCULAR | Status: AC
  Administered 2021-06-02: 1 via INTRAMUSCULAR

## 2021-06-02 NOTE — Progress Notes (Signed)
HPI: Curtis Trevino is a 56 y.o. male who presents to the [facility] for [place] administration.  Patient Active Problem List   Diagnosis Date Noted   COVID 04/26/2021   Migraine 09/01/2020   Therapeutic drug monitoring 07/20/2020   Rectal bleeding 07/20/2020   HIV disease ([place]) 02/16/2020   Healthcare maintenance 02/16/2020   Immunization counseling 02/16/2020   Syphilis 02/16/2020   High risk sexual behavior 02/16/2020   Polyarthralgia 04/23/2019   Prediabetes 04/23/2019   Chronic pain of both knees 03/24/2019   Bilateral hand pain 03/24/2019   Pain of great toe, right 03/24/2019    Patient's Medications  New Prescriptions   No medications on file  Previous Medications   CABOTEGRAVIR & RILPIVIRINE ER (CABENUVA) 600 & 900 MG/3ML INJECTION    Inject 1 kit into the muscle every 30 (thirty) days.   CABOTEGRAVIR & RILPIVIRINE ER (CABENUVA) 600 & 900 MG/3ML INJECTION    Inject 1 kit into the muscle every 2 (two) months.   DICLOFENAC SODIUM (VOLTAREN) 1 % GEL    Apply 4 g topically 4 (four) times daily.   DICLOFENAC SODIUM (VOLTAREN) 1 % GEL    Apply 4 g topically 4 (four) times daily.   DOLUTEGRAVIR-LAMIVUDINE (DOVATO) 50-300 MG TABS    Take 1 tablet by mouth daily.   DULOXETINE (CYMBALTA) 30 MG CAPSULE    Take 1 capsule (30 mg total) by mouth daily.   MELOXICAM (MOBIC) 15 MG TABLET    Take 1 tablet by mouth once daily   TAMSULOSIN (FLOMAX) 0.4 MG CAPS CAPSULE    Take 1 capsule (0.4 mg total) by mouth daily.   TOPIRAMATE (TOPAMAX) 25 MG TABLET    Take 1 tablet by mouth twice daily  Modified Medications   No medications on file  Discontinued Medications   No medications on file    Allergies: Allergies  Allergen Reactions   Codeine Hives, Itching and Rash   Tylenol [Acetaminophen] Nausea And Vomiting    Vomiting every time he takes tylenol    Past Medical History: Past Medical History:  Diagnosis Date   Acid reflux    Chronic back pain     Social  History: Social History   Socioeconomic History   Marital status: Single    Spouse name: Not on file   Number of children: Not on file   Years of education: Not on file   Highest education level: Not on file  Occupational History   Not on file  Tobacco Use   Smoking status: Former    Types: Cigarettes    Quit date: 11/15/2010    Years since quitting: 10.5   Smokeless tobacco: Never  Substance and Sexual Activity   Alcohol use: Not Currently    Alcohol/week: 3.0 standard drinks    Types: 3 Shots of liquor per week    Comment: 3 drink a day   Drug use: No   Sexual activity: Yes    Partners: Male    Comment: declined condoms  Other Topics Concern   Not on file  Social History Narrative   Not on file   Social Determinants of Health   Financial Resource Strain: Not on file  Food Insecurity: Not on file  Transportation Needs: Not on file  Physical Activity: Not on file  Stress: Not on file  Social Connections: Not on file    Labs: Lab Results  Component Value Date   HIV1RNAQUANT Not Detected 05/02/2021   HIV1RNAQUANT Not Detected 12/14/2020  HIV1RNAQUANT Not Detected 10/27/2020   CD4TABS 839 12/14/2020   CD4TABS 864 07/20/2020   CD4TABS 819 04/21/2020    RPR and STI Lab Results  Component Value Date   LABRPR REACTIVE (A) 10/27/2020   LABRPR REACTIVE (A) 07/20/2020   LABRPR REACTIVE (A) 06/08/2020   LABRPR REACTIVE (A) 04/21/2020   LABRPR REACTIVE (A) 01/30/2020   RPRTITER 1:2 (H) 10/27/2020   RPRTITER 1:2 (H) 07/20/2020   RPRTITER 1:8 (H) 06/08/2020   RPRTITER 1:4 (H) 04/21/2020   RPRTITER 1:4 (H) 01/30/2020    STI Results GC CT  10/27/2020 Negative Negative  10/27/2020 Negative Positive(A)  10/27/2020 Negative Negative  06/08/2020 Negative Negative  06/08/2020 Negative Negative  06/08/2020 Negative Negative  02/16/2020 Negative Negative  02/16/2020 Negative Negative  01/30/2020 Negative Negative  06/18/2019 Negative Negative  06/18/2019 Positive(A) Negative   06/18/2019 Positive(A) Negative  06/13/2018 Negative Negative  06/13/2018 Negative Negative  06/13/2018 Negative Negative  03/18/2018 Negative Negative  03/18/2018 Negative Negative  03/18/2018 **POSITIVE**(A) **POSITIVE**(A)  09/18/2017 Negative Negative  09/18/2017 Negative Negative    Hepatitis B Lab Results  Component Value Date   HEPBSAB REACTIVE (A) 07/20/2020   HEPBSAG NON-REACTIVE 07/20/2020   HEPBCAB NON-REACTIVE 07/20/2020   Hepatitis C Lab Results  Component Value Date   HEPCAB NON-REACTIVE 01/30/2020   Hepatitis A Lab Results  Component Value Date   HAV REACTIVE (A) 01/30/2020   Lipids: Lab Results  Component Value Date   CHOL 191 01/30/2020   TRIG 193 (H) 01/30/2020   HDL 42 01/30/2020   CHOLHDL 4.5 01/30/2020   LDLCALC 118 (H) 01/30/2020    TARGET DATE: The 23rd  Assessment: Curtis Trevino presents today for their maintenance Cabenuva injections. Past injections were tolerated well without issues. No problems with systemic side effects of injections.  Administered cabotegravir 633m/3mL in left upper outer quadrant of the gluteal muscle. Administered rilpivirine 900 mg/318min the right upper outer quadrant of the gluteal muscle. Monitored patient for 10 minutes after injection. Injections were tolerated well without issue. Patient will follow up in 2 months for next set of injections.  Plan: - Cabenuva injections administered - HIV RNA today - Next injections scheduled for 4/25 with Dr. WaJuleen Chinand 6/22 with me - Call with any issues or questions  Sullivan Jacuinde L. Bridgit Eynon, PharmD, BCIDP, AAHIVP, CPParmerlinical Pharmacist Practitioner [facility]

## 2021-06-04 LAB — HIV-1 RNA QUANT-NO REFLEX-BLD
HIV 1 RNA Quant: NOT DETECTED Copies/mL
HIV-1 RNA Quant, Log: NOT DETECTED Log cps/mL

## 2021-06-13 ENCOUNTER — Ambulatory Visit: Payer: BC Managed Care – PPO | Admitting: Internal Medicine

## 2021-06-29 ENCOUNTER — Other Ambulatory Visit: Payer: Self-pay | Admitting: Family Medicine

## 2021-06-29 DIAGNOSIS — G43809 Other migraine, not intractable, without status migrainosus: Secondary | ICD-10-CM

## 2021-07-06 ENCOUNTER — Other Ambulatory Visit: Payer: Self-pay

## 2021-07-06 ENCOUNTER — Ambulatory Visit (INDEPENDENT_AMBULATORY_CARE_PROVIDER_SITE_OTHER): Payer: BC Managed Care – PPO | Admitting: Specialist

## 2021-07-06 ENCOUNTER — Encounter: Payer: Self-pay | Admitting: Specialist

## 2021-07-06 ENCOUNTER — Ambulatory Visit (INDEPENDENT_AMBULATORY_CARE_PROVIDER_SITE_OTHER): Payer: BC Managed Care – PPO

## 2021-07-06 VITALS — BP 109/75 | HR 79 | Ht 68.5 in | Wt 189.0 lb

## 2021-07-06 DIAGNOSIS — M25551 Pain in right hip: Secondary | ICD-10-CM | POA: Diagnosis not present

## 2021-07-06 DIAGNOSIS — M545 Low back pain, unspecified: Secondary | ICD-10-CM | POA: Diagnosis not present

## 2021-07-06 DIAGNOSIS — M25559 Pain in unspecified hip: Secondary | ICD-10-CM | POA: Diagnosis not present

## 2021-07-06 DIAGNOSIS — M25541 Pain in joints of right hand: Secondary | ICD-10-CM

## 2021-07-06 DIAGNOSIS — M1711 Unilateral primary osteoarthritis, right knee: Secondary | ICD-10-CM | POA: Diagnosis not present

## 2021-07-06 DIAGNOSIS — M5135 Other intervertebral disc degeneration, thoracolumbar region: Secondary | ICD-10-CM | POA: Diagnosis not present

## 2021-07-06 DIAGNOSIS — M25552 Pain in left hip: Secondary | ICD-10-CM | POA: Diagnosis not present

## 2021-07-06 DIAGNOSIS — M222X1 Patellofemoral disorders, right knee: Secondary | ICD-10-CM

## 2021-07-06 DIAGNOSIS — M1611 Unilateral primary osteoarthritis, right hip: Secondary | ICD-10-CM

## 2021-07-06 DIAGNOSIS — M25542 Pain in joints of left hand: Secondary | ICD-10-CM

## 2021-07-06 DIAGNOSIS — N183 Chronic kidney disease, stage 3 unspecified: Secondary | ICD-10-CM

## 2021-07-06 DIAGNOSIS — M1712 Unilateral primary osteoarthritis, left knee: Secondary | ICD-10-CM

## 2021-07-06 DIAGNOSIS — M180 Bilateral primary osteoarthritis of first carpometacarpal joints: Secondary | ICD-10-CM

## 2021-07-06 MED ORDER — DULOXETINE HCL 20 MG PO CPEP: 20.0000 mg | ORAL_CAPSULE | Freq: Every day | ORAL | 3 refills | Status: AC

## 2021-07-06 NOTE — Patient Instructions (Addendum)
Plan: Knee is suffering from osteoarthritis, only real proven treatments are ?Ice the knee that is suffering from osteoarthritis, only real proven treatments are ?Weight loss, NSIADs which you are advised not to take due to kidney disease and exercise. ?Well padded shoes help. ?Ice the knee 2-3 times a day 15-20 mins at a time.3 times a day 15-20 mins at a time. Hot showers in the AM.  ?Injection with steroid may be of benefit. Avoid cortisone injection if knee replacement is planned. ?Hemp CBD capsules, amazon.com 5,000-7,000 mg per bottle, 60 capsules per bottle, take one capsule twice a day. ?Change cymbalta (duloxitine) to 20 mg per day. ?Cane in the right hand to use with right leg weight bearing. ?Thumb splints prn pain and at night for 2 weeks. ?Check uric acid, and inflamatory markers for sign of hyperuricemia or other cause of multiple joint arthritis.  ?Follow-Up Instructions: No follow-ups on file.   ?

## 2021-07-06 NOTE — Progress Notes (Addendum)
? ?Office Visit Note ?  ?Patient: Curtis Trevino           ?Date of Birth: 1965-05-01           ?MRN: [id number] ?Visit Date: 07/06/2021 ?             ?Requested by: [name], MD ?[address] ?[place],  [place] [address] ?PCP: [name], MD ? ? ?Assessment & Plan: ?Visit Diagnoses:  ?1. Hip pain   ?2. Low back pain, unspecified back pain laterality, unspecified chronicity, unspecified whether sciatica present   ?3. Unilateral primary osteoarthritis, right knee   ?4. Degeneration, intervertebral disc, thoracolumbar   ?5. Unilateral primary osteoarthritis, left knee   ?6. Patellofemoral pain syndrome of right knee   ?7. Unilateral primary osteoarthritis, right hip   ?8. Joint pain in both hands   ?9. Stage 3 chronic kidney disease, unspecified whether stage 3a or 3b CKD ([place])   ?10. Primary osteoarthritis of both first carpometacarpal joints   ? ? ?Plan: Knee is suffering from osteoarthritis, only real proven treatments are ?Ice the knee that is suffering from osteoarthritis, only real proven treatments are ?Weight loss, NSIADs which you are advised not to take due to kidney disease and exercise. ?Well padded shoes help. ?Ice the knee 2-3 times a day 15-20 mins at a time.3 times a day 15-20 mins at a time. Hot showers in the AM.  ?Injection with steroid may be of benefit. Avoid cortisone injection if knee replacement is planned. ?Hemp CBD capsules, amazon.com 5,000-7,000 mg per bottle, 60 capsules per bottle, take one capsule twice a day. ?Change cymbalta (duloxitine) to 20 mg per day. ?Cane in the right hand to use with right leg weight bearing. ?Thumb splints prn pain and at night for 2 weeks. ?Check uric acid, and inflamatory markers for sign of hyperuricemia or other cause of multiple joint arthritis.  ?Follow-Up Instructions: No follow-ups on file.   ? ?Follow-Up Instructions: Return in about 4 weeks (around 08/03/2021).  ? ?Orders:  ?Orders Placed This Encounter  ?Procedures  ? XR HIPS BILAT W OR W/O  PELVIS 3-4 VIEWS  ? Sed Rate (ESR)  ? C-reactive protein  ? Uric acid  ? ?No orders of the defined types were placed in this encounter. ? ? ? ? Procedures: ?No procedures performed ? ? ?Clinical Data: ?No additional findings. ? ? ?Subjective: ?Chief Complaint  ?Patient presents with  ? Lower Back - Follow-up, Pain  ? ? ?56 year old male with history of bilateral knee pain and he has been having some relief with the use of cymbalta and with local transdermal  ?Use of diclofenac and CBD gets. He is to undergo a right TKR and this is to be done at [facility] per his insurance company with [employer]. ?He has complaints of bilateral thumb basal joint pain and left hip pain at night. Today we review previous studies including Bone scan and Hip and pelvic CT scans and MRI of the hips done almost 7 year ago. Pain in the left hiprelates to stair climbing and he is also stilll. ? ?Review of Systems  ?Constitutional: Negative.   ?HENT: Negative.    ?Eyes: Negative.   ?Respiratory: Negative.    ?Cardiovascular: Negative.   ?Gastrointestinal: Negative.   ?Endocrine: Negative.   ?Genitourinary: Negative.   ?Musculoskeletal: Negative.   ?Skin: Negative.   ?Allergic/Immunologic: Negative.   ?Neurological: Negative.   ?Hematological: Negative.   ?Psychiatric/Behavioral: Negative.    ? ? ?Objective: ?Vital  Signs: BP 109/75 (BP Location: Left Arm, Patient Position: Sitting)   Pulse 79   Ht 5' 8.5" (1.74 m)   Wt 189 lb (85.7 kg)   BMI 28.32 kg/m?  ? ?Physical Exam ?Constitutional:   ?   Appearance: He is well-developed.  ?HENT:  ?   Head: Normocephalic and atraumatic.  ?Eyes:  ?   Pupils: Pupils are equal, round, and reactive to light.  ?Pulmonary:  ?   Effort: Pulmonary effort is normal.  ?   Breath sounds: Normal breath sounds.  ?Abdominal:  ?   General: Bowel sounds are normal.  ?   Palpations: Abdomen is soft.  ?Musculoskeletal:  ?   Cervical back: Normal range of motion and neck supple.  ?   Lumbar back: Negative right  straight leg raise test and negative left straight leg raise test.  ?Skin: ?   General: Skin is warm and dry.  ?Neurological:  ?   Mental Status: He is alert and oriented to person, place, and time.  ?Psychiatric:     ?   Behavior: Behavior normal.     ?   Thought Content: Thought content normal.     ?   Judgment: Judgment normal.  ? ?Back Exam  ? ?Tenderness  ?The patient is experiencing tenderness in the lumbar. ? ?Range of Motion  ?Extension:  abnormal  ?Flexion:  abnormal  ?Lateral bend right:  abnormal  ?Lateral bend left:  abnormal  ?Rotation right:  abnormal  ?Rotation left:  abnormal  ? ?Muscle Strength  ?Right Quadriceps:  5/5  ?Left Quadriceps:  5/5  ?Right Hamstrings:  5/5  ?Left Hamstrings:  5/5  ? ?Tests  ?Straight leg raise right: negative ?Straight leg raise left: negative ? ?Other  ?Toe walk: normal ?Heel walk: normal ?Erythema: no back redness ?Scars: absent ? ? ? ?Specialty Comments:  ?No specialty comments available. ? ?Imaging: ?XR HIPS BILAT W OR W/O PELVIS 3-4 VIEWS ? ?Result Date: 07/06/2021 ?AP and lateral radiographs both hips demonstrate minimal narrowing of the left hip joint centrally and superiorly compared with the right hip, there is subchondral sclerosis both superior acetabulae and there is mild superolateral acetabular lip spur with some mild cystic changes suggesting mild DJD left hip. SI joints with minimal narrowing but no osteophytes or sclerosis. 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 Mild curvature of the cervicothoracic and lumbar spine areas.   ? ? ?PMFS History: ?Patient Active Problem List  ? Diagnosis Date Noted  ? COVID 04/26/2021  ? Migraine 09/01/2020  ? Therapeutic drug monitoring 07/20/2020  ? Rectal bleeding 07/20/2020  ? HIV disease ([place]) 02/16/2020  ? Healthcare maintenance 02/16/2020  ? Immunization counseling 02/16/2020  ? Syphilis 02/16/2020  ?  High risk sexual behavior 02/16/2020  ? Polyarthralgia 04/23/2019  ? Prediabetes 04/23/2019  ? Chronic pain of both knees 03/24/2019  ? Bilateral hand pain 03/24/2019  ? Pain of great toe, right 03/24/2019  ? ?Past Medical History:  ?Diagnosis Date  ? Acid reflux   ? Chronic back pain   ?  ?Family History  ?Problem Relation Age of Onset  ? Diabetes Mother   ? Heart failure Mother   ? Cancer Mother   ? Heart failure Father   ? Cancer Father   ?  ?Past Surgical History:  ?Procedure Laterality Date  ? CHOLECYSTECTOMY N/A 11/18/2013  ? Procedure: LAPAROSCOPIC CHOLECYSTECTOMY;  Surgeon: Scherry Ran, MD;  Location: [facility];  Service: General;  Laterality: N/A;  ? ESOPHAGOGASTRODUODENOSCOPY  2005  ? SCROTAL EXPLORATION Left 06/23/2016  ? Procedure: LEFT SCROTAL EXPLORATION WITH EXCISION OF EPIDIDYMAL MASS;  Surgeon: Irine Seal, MD;  Location: [facility];  Service: Urology;  Laterality: Left;  ? ?Social History  ? ?Occupational History  ? Not on file  ?Tobacco Use  ? Smoking status: Former  ?  Types: Cigarettes  ?  Quit date: 11/15/2010  ?  Years since quitting: 10.6  ? Smokeless tobacco: Never  ?Substance and Sexual Activity  ? Alcohol use: Not Currently  ?  Alcohol/week: 3.0 standard drinks  ?  Types: 3 Shots of liquor per week  ?  Comment: 3 drink a day  ? Drug use: No  ? Sexual activity: Yes  ?  Partners: Male  ?  Comment: declined condoms  ? ? ? ? ? ? ?

## 2021-07-07 LAB — URIC ACID: Uric Acid, Serum: 6.6 mg/dL (ref 4.0–8.0)

## 2021-07-07 LAB — C-REACTIVE PROTEIN: CRP: 9.6 mg/L — ABNORMAL HIGH

## 2021-07-07 LAB — SEDIMENTATION RATE: Sed Rate: 19 mm/h (ref 0–20)

## 2021-07-14 ENCOUNTER — Other Ambulatory Visit (HOSPITAL_COMMUNITY): Payer: Self-pay

## 2021-07-22 ENCOUNTER — Telehealth: Payer: BC Managed Care – PPO | Admitting: Physician Assistant

## 2021-07-22 ENCOUNTER — Other Ambulatory Visit (HOSPITAL_COMMUNITY): Payer: Self-pay

## 2021-07-22 DIAGNOSIS — J020 Streptococcal pharyngitis: Secondary | ICD-10-CM

## 2021-07-22 MED ORDER — AMOXICILLIN 500 MG PO CAPS: 500.0000 mg | ORAL_CAPSULE | Freq: Two times a day (BID) | ORAL | 0 refills | Status: AC

## 2021-07-22 NOTE — Patient Instructions (Signed)
?Nathaneil Canary, thank you for joining Mar Daring, PA-C for today's virtual visit.  While this provider is not your primary care provider (PCP), if your PCP is located in our provider database this encounter information will be shared with them immediately following your visit. ? ?Consent: ?(Patient) Curtis Trevino provided verbal consent for this virtual visit at the beginning of the encounter. ? ?Current Medications: ? ?Current Outpatient Medications:  ?  amoxicillin (AMOXIL) 500 MG capsule, Take 1 capsule (500 mg total) by mouth 2 (two) times daily for 10 days., Disp: 20 capsule, Rfl: 0 ?  cabotegravir & rilpivirine ER (CABENUVA) 600 & 900 MG/3ML injection, Inject 1 kit into the muscle every 30 (thirty) days., Disp: 6 mL, Rfl: 1 ?  cabotegravir & rilpivirine ER (CABENUVA) 600 & 900 MG/3ML injection, Inject 1 kit into the muscle every 2 (two) months., Disp: 6 mL, Rfl: 5 ?  diclofenac Sodium (VOLTAREN) 1 % GEL, Apply 4 g topically 4 (four) times daily., Disp: 350 g, Rfl: 3 ?  DULoxetine (CYMBALTA) 20 MG capsule, Take 1 capsule (20 mg total) by mouth daily., Disp: 30 capsule, Rfl: 3 ?  tamsulosin (FLOMAX) 0.4 MG CAPS capsule, Take 1 capsule (0.4 mg total) by mouth daily., Disp: 30 capsule, Rfl: 11 ?  topiramate (TOPAMAX) 25 MG tablet, Take 1 tablet by mouth twice daily, Disp: 60 tablet, Rfl: 2  ? ?Medications ordered in this encounter:  ?Meds ordered this encounter  ?Medications  ? amoxicillin (AMOXIL) 500 MG capsule  ?  Sig: Take 1 capsule (500 mg total) by mouth 2 (two) times daily for 10 days.  ?  Dispense:  20 capsule  ?  Refill:  0  ?  Order Specific Question:   Supervising Provider  ?  Answer:   Noemi Chapel [[id number]]  ?  ? ?*If you need refills on other medications prior to your next appointment, please contact your pharmacy* ? ?Follow-Up: ?Call back or seek an in-person evaluation if the symptoms worsen or if the condition fails to improve as anticipated. ? ?Other Instructions ? ?Strep Throat,  Adult ?Strep throat is an infection in the throat that is caused by bacteria. It is common during the cold months of the year. It mostly affects children who are 57-10 years old. However, people of all ages can get it at any time of the year. This infection spreads from person to person (is contagious) through coughing, sneezing, or having close contact. ?Your health care provider may use other names to describe the infection. When strep throat affects the tonsils, it is called tonsillitis. When it affects the back of the throat, it is called pharyngitis. ?What are the causes? ?This condition is caused by the Streptococcus pyogenes bacteria. ?What increases the risk? ?You are more likely to develop this condition if: ?You care for school-age children, or are around school-age children. Children are more likely to get strep throat and may spread it to others. ?You spend time in crowded places where the infection can spread easily. ?You have close contact with someone who has strep throat. ?What are the signs or symptoms? ?Symptoms of this condition include: ?Fever or chills. ?Redness, swelling, or pain in the tonsils or throat. ?Pain or difficulty when swallowing. ?White or yellow spots on the tonsils or throat. ?Tender glands in the neck and under the jaw. ?Bad smelling breath. ?Red rash all over the body. This is rare. ?How is this diagnosed? ?This condition is diagnosed by tests that check for the  presence and the amount of bacteria that cause strep throat. They are: ?Rapid strep test. Your throat is swabbed and checked for the presence of bacteria. Results are usually ready in minutes. ?Throat culture test. Your throat is swabbed. The sample is placed in a cup that allows infections to grow. Results are usually ready in 1 or 2 days. ?How is this treated? ?This condition may be treated with: ?Medicines that kill germs (antibiotics). ?Medicines that relieve pain or fever. These include: ?Ibuprofen or  acetaminophen. ?Aspirin, only for people who are over the age of 19. ?Throat lozenges. ?Throat sprays. ?Follow these instructions at home: ?Medicines ? ?Take over-the-counter and prescription medicines only as told by your health care provider. ?Take your antibiotic medicine as told by your health care provider. Do not stop taking the antibiotic even if you start to feel better. ?Eating and drinking ? ?If you have trouble swallowing, try eating soft foods until your sore throat feels better. ?Drink enough fluid to keep your urine pale yellow. ?To help relieve pain, you may have: ?Warm fluids, such as soup and tea. ?Cold fluids, such as frozen desserts or popsicles. ?General instructions ?Gargle with a salt-water mixture 3-4 times a day or as needed. To make a salt-water mixture, completely dissolve ?-1 tsp (3-6 g) of salt in 1 cup (237 mL) of warm water. ?Get plenty of rest. ?Stay home from work or school until you have been taking antibiotics for 24 hours. ?Do not use any products that contain nicotine or tobacco. These products include cigarettes, chewing tobacco, and vaping devices, such as e-cigarettes. If you need help quitting, ask your health care provider. ?It is up to you to get your test results. Ask your health care provider, or the department that is doing the test, when your results will be ready. ?Keep all follow-up visits. This is important. ?How is this prevented? ? ?Do not share food, drinking cups, or personal items that could cause the infection to spread to other people. ?Wash your hands often with soap and water for at least 20 seconds. If soap and water are not available, use hand sanitizer. Make sure that all people in your house wash their hands well. ?Have family members tested if they have a sore throat or fever. They may need an antibiotic if they have strep throat. ?Contact a health care provider if: ?You have swelling in your neck that keeps getting bigger. ?You develop a rash, cough, or  earache. ?You cough up a thick mucus that is green, yellow-brown, or bloody. ?You have pain or discomfort that does not get better with medicine. ?Your symptoms seem to be getting worse. ?You have a fever. ?Get help right away if: ?You have new symptoms, such as vomiting, severe headache, stiff or painful neck, chest pain, or shortness of breath. ?You have severe throat pain, drooling, or changes in your voice. ?You have swelling of the neck, or the skin on the neck becomes red and tender. ?You have signs of dehydration, such as tiredness (fatigue), dry mouth, and decreased urination. ?You become increasingly sleepy, or you cannot wake up completely. ?Your joints become red or painful. ?These symptoms may represent a serious problem that is an emergency. Do not wait to see if the symptoms will go away. Get medical help right away. Call your local emergency services (911 in the [place] Do not drive yourself to the hospital. ?Summary ?Strep throat is an infection in the throat that is caused by the Streptococcus pyogenes bacteria. This  infection is spread from person to person (is contagious) through coughing, sneezing, or having close contact. ?Take your medicines, including antibiotics, as told by your health care provider. Do not stop taking the antibiotic even if you start to feel better. ?To prevent the spread of germs, wash your hands well with soap and water. Have others do the same. Do not share food, drinking cups, or personal items. ?Get help right away if you have new symptoms, such as vomiting, severe headache, stiff or painful neck, chest pain, or shortness of breath. ?This information is not intended to replace advice given to you by your health care provider. Make sure you discuss any questions you have with your health care provider. ?Document Revised: 07/20/2020 Document Reviewed: 07/20/2020 ?Elsevier Patient Education ? [place]. ? ? ? ?If you have been instructed to have an in-person  evaluation today at a local Urgent Care facility, please use the link below. It will take you to a list of all of our available  Urgent Cares, including address, phone number and hours of operation. Please do not

## 2021-07-22 NOTE — Progress Notes (Signed)
?Virtual Visit Consent  ? ?Curtis Trevino, you are scheduled for a virtual visit with a [facility] provider today.   ?  ?Just as with appointments in the office, your consent must be obtained to participate.  Your consent will be active for this visit and any virtual visit you may have with one of our providers in the next 365 days.   ?  ?If you have a MyChart account, a copy of this consent can be sent to you electronically.  All virtual visits are billed to your insurance company just like a traditional visit in the office.   ? ?As this is a virtual visit, video technology does not allow for your provider to perform a traditional examination.  This may limit your provider's ability to fully assess your condition.  If your provider identifies any concerns that need to be evaluated in person or the need to arrange testing (such as labs, EKG, etc.), we will make arrangements to do so.   ?  ?Although advances in technology are sophisticated, we cannot ensure that it will always work on either your end or our end.  If the connection with a video visit is poor, the visit may have to be switched to a telephone visit.  With either a video or telephone visit, we are not always able to ensure that we have a secure connection.    ? ?I need to obtain your verbal consent now.   Are you willing to proceed with your visit today?  ?  ?Curtis Trevino has provided verbal consent on 07/22/2021 for a virtual visit (video or telephone). ?  ?Mar Daring, PA-C  ? ?Date: 07/22/2021 1:06 PM ? ? ?Virtual Visit via Video Note  ? ?Curtis Trevino, connected with  STEFFEN HASE  ([id number], June 22, 1965) on 07/22/21 at  1:00 PM EDT by a video-enabled telemedicine application and verified that I am speaking with the correct person using two identifiers. ? ?Location: ?Patient: Virtual Visit Location Patient: Home ?Provider: Virtual Visit Location Provider: Home Office ?  ?I discussed the limitations of evaluation and  management by telemedicine and the availability of in person appointments. The patient expressed understanding and agreed to proceed.   ? ?History of Present Illness: ?Curtis Trevino is a 56 y.o. who identifies as a male who was assigned male at birth, and is being seen today for sore throat. ? ?HPI: Sore Throat  ?This is a new problem. The current episode started in the past 7 days. The problem has been gradually worsening. The pain is moderate. Associated symptoms include congestion, coughing, a hoarse voice, swollen glands and trouble swallowing. He has had exposure to strep. Exposure to: all 3 sons have strep. Treatments tried: allegra d, zyrtec, flonase. The treatment provided no relief.   ? ? ?Problems:  ?Patient Active Problem List  ? Diagnosis Date Noted  ? COVID 04/26/2021  ? Migraine 09/01/2020  ? Therapeutic drug monitoring 07/20/2020  ? Rectal bleeding 07/20/2020  ? HIV disease ([place]) 02/16/2020  ? Healthcare maintenance 02/16/2020  ? Immunization counseling 02/16/2020  ? Syphilis 02/16/2020  ? High risk sexual behavior 02/16/2020  ? Polyarthralgia 04/23/2019  ? Prediabetes 04/23/2019  ? Chronic pain of both knees 03/24/2019  ? Bilateral hand pain 03/24/2019  ? Pain of great toe, right 03/24/2019  ?  ?Allergies:  ?Allergies  ?Allergen Reactions  ? Codeine Hives, Itching and Rash  ? Tylenol [Acetaminophen] Nausea And Vomiting  ?  Vomiting every time  he takes tylenol  ? ?Medications:  ?Current Outpatient Medications:  ?  amoxicillin (AMOXIL) 500 MG capsule, Take 1 capsule (500 mg total) by mouth 2 (two) times daily for 10 days., Disp: 20 capsule, Rfl: 0 ?  cabotegravir & rilpivirine ER (CABENUVA) 600 & 900 MG/3ML injection, Inject 1 kit into the muscle every 30 (thirty) days., Disp: 6 mL, Rfl: 1 ?  cabotegravir & rilpivirine ER (CABENUVA) 600 & 900 MG/3ML injection, Inject 1 kit into the muscle every 2 (two) months., Disp: 6 mL, Rfl: 5 ?  diclofenac Sodium (VOLTAREN) 1 % GEL, Apply 4 g topically 4 (four)  times daily., Disp: 350 g, Rfl: 3 ?  DULoxetine (CYMBALTA) 20 MG capsule, Take 1 capsule (20 mg total) by mouth daily., Disp: 30 capsule, Rfl: 3 ?  tamsulosin (FLOMAX) 0.4 MG CAPS capsule, Take 1 capsule (0.4 mg total) by mouth daily., Disp: 30 capsule, Rfl: 11 ?  topiramate (TOPAMAX) 25 MG tablet, Take 1 tablet by mouth twice daily, Disp: 60 tablet, Rfl: 2 ? ?Observations/Objective: ?Patient is well-developed, well-nourished in no acute distress.  ?Resting comfortably at home.  ?Head is normocephalic, atraumatic.  ?No labored breathing.  ?Speech is clear and coherent with logical content.  ?Patient is alert and oriented at baseline.  ? ? ?Assessment and Plan: ?1. Strep pharyngitis ?- amoxicillin (AMOXIL) 500 MG capsule; Take 1 capsule (500 mg total) by mouth 2 (two) times daily for 10 days.  Dispense: 20 capsule; Refill: 0 ? ?- Suspected strep, exposure all 3 sons have tested positive ?- Amoxicillin prescribed ?- Continue allergy medications ?- Push fluids ?- Salt water gargles and/or chloraseptic spray as needed ?- Seek in person evaluation if not improving or is symptoms worsen in the meantime ? ?Follow Up Instructions: ?I discussed the assessment and treatment plan with the patient. The patient was provided an opportunity to ask questions and all were answered. The patient agreed with the plan and demonstrated an understanding of the instructions.  A copy of instructions were sent to the patient via MyChart unless otherwise noted below.  ? ? ?The patient was advised to call back or seek an in-person evaluation if the symptoms worsen or if the condition fails to improve as anticipated. ? ?Time:  ?I spent 11 minutes with the patient via telehealth technology discussing the above problems/concerns.   ? ?Mar Daring, PA-C ?

## 2021-07-25 ENCOUNTER — Other Ambulatory Visit (HOSPITAL_COMMUNITY): Payer: Self-pay

## 2021-07-26 ENCOUNTER — Telehealth: Payer: Self-pay

## 2021-07-26 NOTE — Telephone Encounter (Signed)
[facility] Patient Advocate Encounter ? ?Patient's medication(Cabenuva) have been couriered to [facility] from [facility] and will be administered on the patient next office visit on 08/02/21. ? ?[name] , CPhT ?Specialty Pharmacy Patient Advocate ?[facility] ?Phone: [phone] ?Fax:  [phone]  ?

## 2021-08-02 ENCOUNTER — Ambulatory Visit: Payer: BC Managed Care – PPO | Admitting: Internal Medicine

## 2021-08-02 ENCOUNTER — Encounter: Payer: BC Managed Care – PPO | Admitting: Internal Medicine

## 2021-08-02 ENCOUNTER — Other Ambulatory Visit (HOSPITAL_COMMUNITY)
Admission: RE | Admit: 2021-08-02 | Discharge: 2021-08-02 | Disposition: A | Discharge status: Home / Self Care | Payer: BC Managed Care – PPO | Source: Ambulatory Visit | Attending: Internal Medicine | Admitting: Internal Medicine

## 2021-08-02 ENCOUNTER — Other Ambulatory Visit: Payer: Self-pay

## 2021-08-02 ENCOUNTER — Ambulatory Visit (INDEPENDENT_AMBULATORY_CARE_PROVIDER_SITE_OTHER): Payer: BC Managed Care – PPO | Admitting: Pharmacist

## 2021-08-02 DIAGNOSIS — Z113 Encounter for screening for infections with a predominantly sexual mode of transmission: Secondary | ICD-10-CM | POA: Diagnosis not present

## 2021-08-02 DIAGNOSIS — B20 Human immunodeficiency virus [HIV] disease: Secondary | ICD-10-CM | POA: Diagnosis not present

## 2021-08-02 MED ORDER — CABOTEGRAVIR & RILPIVIRINE ER 600 & 900 MG/3ML IM SUER
1.0000 | Freq: Once | INTRAMUSCULAR | Status: AC
  Administered 2021-08-02: 1 via INTRAMUSCULAR

## 2021-08-02 NOTE — Progress Notes (Signed)
? ?HPI: Curtis Trevino is a 56 y.o. male who presents to the [facility] for [place] administration. ? ?Patient Active Problem List  ? Diagnosis Date Noted  ? COVID 04/26/2021  ? Migraine 09/01/2020  ? Therapeutic drug monitoring 07/20/2020  ? Rectal bleeding 07/20/2020  ? HIV disease ([place]) 02/16/2020  ? Healthcare maintenance 02/16/2020  ? Immunization counseling 02/16/2020  ? Syphilis 02/16/2020  ? High risk sexual behavior 02/16/2020  ? Polyarthralgia 04/23/2019  ? Prediabetes 04/23/2019  ? Chronic pain of both knees 03/24/2019  ? Bilateral hand pain 03/24/2019  ? Pain of great toe, right 03/24/2019  ? ? ?Patient's Medications  ?New Prescriptions  ? No medications on file  ?Previous Medications  ? CABOTEGRAVIR & RILPIVIRINE ER (CABENUVA) 600 & 900 MG/3ML INJECTION    Inject 1 kit into the muscle every 30 (thirty) days.  ? CABOTEGRAVIR & RILPIVIRINE ER (CABENUVA) 600 & 900 MG/3ML INJECTION    Inject 1 kit into the muscle every 2 (two) months.  ? DICLOFENAC SODIUM (VOLTAREN) 1 % GEL    Apply 4 g topically 4 (four) times daily.  ? DULOXETINE (CYMBALTA) 20 MG CAPSULE    Take 1 capsule (20 mg total) by mouth daily.  ? TAMSULOSIN (FLOMAX) 0.4 MG CAPS CAPSULE    Take 1 capsule (0.4 mg total) by mouth daily.  ? TOPIRAMATE (TOPAMAX) 25 MG TABLET    Take 1 tablet by mouth twice daily  ?Modified Medications  ? No medications on file  ?Discontinued Medications  ? No medications on file  ? ? ?Allergies: ?Allergies  ?Allergen Reactions  ? Codeine Hives, Itching and Rash  ? Tylenol [Acetaminophen] Nausea And Vomiting  ?  Vomiting every time he takes tylenol  ? ? ?Past Medical History: ?Past Medical History:  ?Diagnosis Date  ? Acid reflux   ? Chronic back pain   ? ? ?Social History: ?Social History  ? ?Socioeconomic History  ? Marital status: Single  ?  Spouse name: Not on file  ? Number of children: Not on file  ? Years of education: Not on file  ? Highest education level: Not on file  ?Occupational History  ? Not  on file  ?Tobacco Use  ? Smoking status: Former  ?  Types: Cigarettes  ?  Quit date: 11/15/2010  ?  Years since quitting: 10.7  ? Smokeless tobacco: Never  ?Substance and Sexual Activity  ? Alcohol use: Not Currently  ?  Alcohol/week: 3.0 standard drinks  ?  Types: 3 Shots of liquor per week  ?  Comment: 3 drink a day  ? Drug use: No  ? Sexual activity: Yes  ?  Partners: Male  ?  Comment: declined condoms  ?Other Topics Concern  ? Not on file  ?Social History Narrative  ? Not on file  ? ?Social Determinants of Health  ? ?Financial Resource Strain: Not on file  ?Food Insecurity: Not on file  ?Transportation Needs: Not on file  ?Physical Activity: Not on file  ?Stress: Not on file  ?Social Connections: Not on file  ? ? ?Labs: ?Lab Results  ?Component Value Date  ? HIV1RNAQUANT Not Detected 06/02/2021  ? HIV1RNAQUANT Not Detected 05/02/2021  ? HIV1RNAQUANT Not Detected 12/14/2020  ? CD4TABS 839 12/14/2020  ? CD4TABS 864 07/20/2020  ? CD4TABS 819 04/21/2020  ? ? ?RPR and STI ?Lab Results  ?Component Value Date  ? LABRPR REACTIVE (A) 10/27/2020  ? LABRPR REACTIVE (A) 07/20/2020  ? LABRPR REACTIVE (A) 06/08/2020  ?  LABRPR REACTIVE (A) 04/21/2020  ? LABRPR REACTIVE (A) 01/30/2020  ? RPRTITER 1:2 (H) 10/27/2020  ? RPRTITER 1:2 (H) 07/20/2020  ? RPRTITER 1:8 (H) 06/08/2020  ? RPRTITER 1:4 (H) 04/21/2020  ? RPRTITER 1:4 (H) 01/30/2020  ? ? ?STI Results GC CT  ?10/27/2020 ? 9:07 AM Negative    ? Negative    ? Negative   Negative    ? Positive    ? Negative    ?06/08/2020 ? 3:29 PM Negative    ? Negative    ? Negative   Negative    ? Negative    ? Negative    ?02/16/2020 ? 9:51 AM Negative   Negative    ?02/16/2020 ? 9:20 AM Negative   Negative    ?01/30/2020 ? 9:34 AM Negative   Negative    ?06/18/2019 ?10:32 AM Positive    ? Positive    ? Negative   Negative    ? Negative    ? Negative    ?06/13/2018 ?12:00 AM Negative    ? Negative    ? Negative   Negative    ? Negative    ? Negative    ?03/18/2018 ?12:00 AM **POSITIVE**    ? Negative     ? Negative   **POSITIVE**    ? Negative    ? Negative    ?09/18/2017 ?12:00 AM Negative    ? Negative    ? Negative   Negative    ? Negative    ? Negative    ?08/23/2017 ?12:00 AM **POSITIVE**    ? Negative    ? Negative   Negative    ? Negative    ? Negative    ? ? ?Hepatitis B ?Lab Results  ?Component Value Date  ? HEPBSAB REACTIVE (A) 07/20/2020  ? HEPBSAG NON-REACTIVE 07/20/2020  ? HEPBCAB NON-REACTIVE 07/20/2020  ? ?Hepatitis C ?Lab Results  ?Component Value Date  ? HEPCAB NON-REACTIVE 01/30/2020  ? ?Hepatitis A ?Lab Results  ?Component Value Date  ? HAV REACTIVE (A) 01/30/2020  ? ?Lipids: ?Lab Results  ?Component Value Date  ? CHOL 191 01/30/2020  ? TRIG 193 (H) 01/30/2020  ? HDL 42 01/30/2020  ? CHOLHDL 4.5 01/30/2020  ? LDLCALC 118 (H) 01/30/2020  ? ? ?TARGET DATE: ?23rd of the month  ? ?Assessment: ?Curtis Trevino presents today for their maintenance Cabenuva injections. He is accompanied by his partner, Curtis Trevino. Past injections were tolerated well without issues. No problems with systemic side effects of injections. Patient did experience mild soreness. Will check HIV RNA today. Will check RPR and urine for cytology today. Patient declined oral/rectal swabs today.  ? ?Of note, patient has an orthopedics appointment with Dr. [name] on 6/29. Patient asks if we can get updated BMP before this appointment. Will get BMP at next visit.  ? ?Patient inquires if [name] can cause weight gain as he endorses gaining 10 pounds since starting injections in 1/23. Discussed with patient how [name] was not seen to cause significant weight gain in the studies. Discussed with patient about lifestyle changes, such as reducing carb/sweet intake and to increase movement, like taking walks. Also discussed that his new medication, Cymbalta, can also be attributing to the weight gain.  ? ?Administered cabotegravir 630m/3mL in left upper outer quadrant of the gluteal muscle. Administered rilpivirine 900 mg/311min the right upper outer  quadrant of the gluteal muscle. Injections were tolerated well. Patient will follow up in 2 months for next set of injections. ? ?Plan: ?-  Cabenuva injections administered ?- Get RPR, urine for cytology today ?- Follow up on results to see if treatment is needed ?- Next injections scheduled for 6/22 with Curtis Trevino and 8/22 with Curtis Trevino ?- Get BMP at 6/22 visit ?- Call with any issues or questions ? ?Curtis Trevino, PharmD Candidate ?08/02/2021 3:58 PM ? ?

## 2021-08-03 LAB — URINE CYTOLOGY ANCILLARY ONLY
Chlamydia: NEGATIVE
Comment: NEGATIVE
Comment: NORMAL
Neisseria Gonorrhea: NEGATIVE

## 2021-08-04 ENCOUNTER — Other Ambulatory Visit: Payer: Self-pay | Admitting: Pharmacist

## 2021-08-04 LAB — FLUORESCENT TREPONEMAL AB(FTA)-IGG-BLD: Fluorescent Treponemal ABS: REACTIVE — AB

## 2021-08-04 LAB — RPR TITER: RPR Titer: 1:2 {titer} — ABNORMAL HIGH

## 2021-08-04 LAB — RPR: RPR Ser Ql: REACTIVE — AB

## 2021-08-04 LAB — HIV-1 RNA QUANT-NO REFLEX-BLD
HIV 1 RNA Quant: NOT DETECTED copies/mL
HIV-1 RNA Quant, Log: NOT DETECTED Log copies/mL

## 2021-08-12 ENCOUNTER — Encounter: Payer: Self-pay | Admitting: Specialist

## 2021-08-12 ENCOUNTER — Ambulatory Visit (INDEPENDENT_AMBULATORY_CARE_PROVIDER_SITE_OTHER): Payer: BC Managed Care – PPO | Admitting: Specialist

## 2021-08-12 VITALS — BP 105/75 | HR 89 | Ht 65.8 in | Wt 189.0 lb

## 2021-08-12 DIAGNOSIS — G9589 Other specified diseases of spinal cord: Secondary | ICD-10-CM | POA: Diagnosis not present

## 2021-08-12 DIAGNOSIS — M7121 Synovial cyst of popliteal space [Baker], right knee: Secondary | ICD-10-CM

## 2021-08-12 DIAGNOSIS — M5136 Other intervertebral disc degeneration, lumbar region: Secondary | ICD-10-CM

## 2021-08-12 DIAGNOSIS — M1711 Unilateral primary osteoarthritis, right knee: Secondary | ICD-10-CM | POA: Diagnosis not present

## 2021-08-12 DIAGNOSIS — M222X1 Patellofemoral disorders, right knee: Secondary | ICD-10-CM

## 2021-08-12 DIAGNOSIS — N183 Chronic kidney disease, stage 3 unspecified: Secondary | ICD-10-CM

## 2021-08-12 MED ORDER — METHYLPREDNISOLONE ACETATE 40 MG/ML IJ SUSP
40.0000 mg | INTRAMUSCULAR | Status: AC | PRN
  Administered 2021-08-12: 40 mg via INTRA_ARTICULAR

## 2021-08-12 MED ORDER — BUPIVACAINE HCL 0.5 % IJ SOLN
3.0000 mL | INTRAMUSCULAR | Status: AC | PRN
  Administered 2021-08-12: 3 mL via INTRA_ARTICULAR

## 2021-08-12 NOTE — Progress Notes (Signed)
Having joint pain, c/o right knee was injected year ago by Dr. Barbaraann Barthel for bakers cyst and feels like fluid is back. Taking pain meds but feels like not helping.  ?

## 2021-08-12 NOTE — Patient Instructions (Addendum)
Plan: Knee is suffering from osteoarthritis, only real proven treatments are ?Weight loss, NSIADs like diclofenac gel or tylenol and exercise. Should avoid oral arthritis meds like  ?Motrin, Naprosyn and diclofenac due to risk of resulting kidney damage. Cymbalta is okay.  ?Ice the knee the knee is suffering from osteoarthritis, only real proven treatments are ?Ice the knee 2-3 times a day 15-20 mins at a time 3 times a day 15-20 mins at a time. Hot showers in the AM.  ?Injection with steroid may be of benefit. ?Hemp CBD capsules, amazon.com 5,000-7,000 mg per bottle, 60 capsules per bottle, take one capsule twice a day. ?Cane in the right hand to use with right leg weight bearing. ?

## 2021-08-12 NOTE — Progress Notes (Signed)
? ?Office Visit Note ?  ?Patient: Curtis Trevino           ?Date of Birth: Aug 26, 1965           ?MRN: [id number] ?Visit Date: 08/12/2021 ?             ?Requested by: [name], MD ?[address] ?[place],  [place] [address] ?PCP: [name], MD ? ? ?Assessment & Plan: ?Visit Diagnoses:  ?1. Unilateral primary osteoarthritis, right knee   ?2. Stage 3 chronic kidney disease, unspecified whether stage 3a or 3b CKD ([place])   ?3. Patellofemoral pain syndrome of right knee   ?4. Degenerative disc disease, lumbar   ?5. Synovial cyst of popliteal space ([place]), right knee   ? ? ?Plan: Knee is suffering from osteoarthritis, only real proven treatments are ?Weight loss, NSIADs like diclofenac gel or tylenol and exercise. Should avoid oral arthritis meds like  ?Motrin, Naprosyn and diclofenac due to risk of resulting kidney damage. Cymbalta is okay.  ?Ice the knee the knee is suffering from osteoarthritis, only real proven treatments are ?Ice the knee 2-3 times a day 15-20 mins at a time 3 times a day 15-20 mins at a time. Hot showers in the AM.  ?Injection with steroid may be of benefit. ?Hemp CBD capsules, amazon.com 5,000-7,000 mg per bottle, 60 capsules per bottle, take one capsule twice a day. ?Cane in the right hand to use with right leg weight bearing. ?Follow-Up Instructions: No follow-ups on file.   ? ?Follow-Up Instructions: No follow-ups on file.  ? ?Orders:  ?Orders Placed This Encounter  ?Procedures  ? Large Joint Inj: R knee  ? ?No orders of the defined types were placed in this encounter. ? ? ? ? Procedures: ?Large Joint Inj: R knee on 08/12/2021 10:01 AM ?Indications: pain ?Details: 25 G 1.5 in needle, anteromedial approach ? ?Arthrogram: No ? ?Medications: 40 mg methylPREDNISolone acetate 40 MG/ML; 3 mL bupivacaine 0.5 % ?Outcome: tolerated well, no immediate complications ?Procedure, treatment alternatives, risks and benefits explained, specific risks discussed. Consent was given by the patient.  Immediately prior to procedure a time out was called to verify the correct patient, procedure, equipment, support staff and site/side marked as required. Patient was prepped and draped in the usual sterile fashion.  ? ? ? ?Clinical Data: ?No additional findings. ? ? ?Subjective: ?No chief complaint on file. ? ? ?56 year old male with history of right knee pain and swelling with popliteal cyst. He has had eval with [facility] and is apparently  being scheduled for right TKR. In the mean time he has problems with ongoing right knee pain and swelling with posterior knee popliteal swelling. He has had back pain that is worse with standing and walking. He is concerned that he may not be able to return to work at [employer] due to increased stresses and their affect on the right knee if a replacement is done and resulting likely ?Early loosening. He is wishing that TKR could be done locally but  ?His workplace is with [employer] and he feels like they are dragging their feet in terms of getting the surgery done. There is some right foot numbness and tingling into the right foot into the toes outer most. No bowel or bladder difficulty. 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  ?There are degenerative disc changes  L1-2, L3-4, L2-3 and L4-5.  ?Small disc protrusions at multiple segments that appear noncompressive.  ? ? ?Review of Systems  ?Constitutional: Negative.   ?HENT: Negative.    ?Eyes: Negative.   ?Respiratory: Negative.    ?Cardiovascular: Negative.   ?Gastrointestinal: Negative.   ?Endocrine: Negative.   ?Genitourinary: Negative.   ?Musculoskeletal: Negative.   ?Skin: Negative.   ?Allergic/Immunologic: Negative.   ?Neurological: Negative.   ?Hematological: Negative.   ?Psychiatric/Behavioral: Negative.    ? ? ?Objective: ?Vital Signs: BP 105/75   Pulse 89   Ht 5' 5.8" (1.671 m)   Wt 189  lb (85.7 kg)   BMI 30.69 kg/m?  ? ?Physical Exam ?Constitutional:   ?   Appearance: He is well-developed.  ?HENT:  ?   Head: Normocephalic and atraumatic.  ?Eyes:  ?   Pupils: Pupils are equal, round, and reactive to light.  ?Pulmonary:  ?   Effort: Pulmonary effort is normal.  ?   Breath sounds: Normal breath sounds.  ?Abdominal:  ?   General: Bowel sounds are normal.  ?   Palpations: Abdomen is soft.  ?Musculoskeletal:  ?   Cervical back: Normal range of motion and neck supple.  ?   Lumbar back: Negative right straight leg raise test and negative left straight leg raise test.  ?Skin: ?   General: Skin is warm and dry.  ?Neurological:  ?   Mental Status: He is alert and oriented to person, place, and time.  ?Psychiatric:     ?   Behavior: Behavior normal.     ?   Thought Content: Thought content normal.     ?   Judgment: Judgment normal.  ? ? ?Back Exam  ? ?Tenderness  ?The patient is experiencing tenderness in the lumbar. ? ?Range of Motion  ?Extension:  abnormal  ?Flexion:  abnormal  ?Lateral bend right:  abnormal  ?Lateral bend left:  abnormal  ?Rotation right:  abnormal  ?Rotation left:  abnormal  ? ?Muscle Strength  ?Right Quadriceps:  5/5  ?Left Quadriceps:  5/5  ?Right Hamstrings:  5/5  ?Left Hamstrings:  5/5  ? ?Tests  ?Straight leg raise right: negative ?Straight leg raise left: negative ? ?Other  ?Toe walk: normal ?Heel walk: normal ?Sensation: decreased ?Gait: normal  ? ? ? ? ?Specialty Comments:  ?No specialty comments available. ? ?Imaging: ?No results found. ? ? ?PMFS History: ?Patient Active Problem List  ? Diagnosis Date Noted  ? COVID 04/26/2021  ? Migraine 09/01/2020  ? Therapeutic drug monitoring 07/20/2020  ? Rectal bleeding 07/20/2020  ? HIV disease ([place]) 02/16/2020  ? Healthcare maintenance 02/16/2020  ? Immunization counseling 02/16/2020  ? Syphilis 02/16/2020  ? High risk sexual behavior 02/16/2020  ? Polyarthralgia 04/23/2019  ? Prediabetes 04/23/2019  ? Chronic pain of both knees  03/24/2019  ? Bilateral hand pain 03/24/2019  ? Pain of great toe, right 03/24/2019  ? ?Past Medical History:  ?Diagnosis Date  ? Acid reflux   ? Chronic back pain   ?  ?Family History  ?Problem Relation Age of Onset  ? Diabetes Mother   ? Heart failure Mother   ? Cancer Mother   ? Heart failure Father   ? Cancer Father   ?  ?Past Surgical History:  ?Procedure Laterality Date  ? CHOLECYSTECTOMY N/A 11/18/2013  ? Procedure: LAPAROSCOPIC CHOLECYSTECTOMY;  Surgeon: Scherry Ran, MD;  Location: [facility];  Service: General;  Laterality: N/A;  ? ESOPHAGOGASTRODUODENOSCOPY  2005  ? SCROTAL EXPLORATION Left 06/23/2016  ?  Procedure: LEFT SCROTAL EXPLORATION WITH EXCISION OF EPIDIDYMAL MASS;  Surgeon: Irine Seal, MD;  Location: [facility];  Service: Urology;  Laterality: Left;  ? ?Social History  ? ?Occupational History  ? Not on file  ?Tobacco Use  ? Smoking status: Former  ?  Types: Cigarettes  ?  Quit date: 11/15/2010  ?  Years since quitting: 10.7  ? Smokeless tobacco: Never  ?Substance and Sexual Activity  ? Alcohol use: Not Currently  ?  Alcohol/week: 3.0 standard drinks  ?  Types: 3 Shots of liquor per week  ?  Comment: 3 drink a day  ? Drug use: No  ? Sexual activity: Yes  ?  Partners: Male  ?  Comment: declined condoms  ? ? ? ? ? ? ?

## 2021-08-23 ENCOUNTER — Telehealth: Payer: Self-pay | Admitting: Specialist

## 2021-08-23 NOTE — Telephone Encounter (Signed)
LTD forms have been received. Please advise work status for [employer].  ?

## 2021-08-24 NOTE — Telephone Encounter (Signed)
Per Dr. [name], he has not taken patient out of work, he may work with knee arthritis pain and he can alternate sitting, standing and walking. ?

## 2021-09-13 ENCOUNTER — Encounter: Payer: Self-pay | Admitting: *Deleted

## 2021-09-19 ENCOUNTER — Other Ambulatory Visit (HOSPITAL_COMMUNITY): Payer: Self-pay

## 2021-09-20 ENCOUNTER — Other Ambulatory Visit (HOSPITAL_COMMUNITY): Payer: Self-pay

## 2021-09-22 ENCOUNTER — Telehealth: Payer: Self-pay

## 2021-09-22 NOTE — Telephone Encounter (Signed)
[facility] Patient Advocate Encounter  Patient's medication [name]) have been couriered to [facility] from [employer] and will be administered on the patient next office visit on 09/29/21.  [name] , [facility] Patient [facility] for Infectious Disease Phone: [phone] Fax:  [phone]

## 2021-09-29 ENCOUNTER — Ambulatory Visit (INDEPENDENT_AMBULATORY_CARE_PROVIDER_SITE_OTHER): Payer: BC Managed Care – PPO | Admitting: Pharmacist

## 2021-09-29 ENCOUNTER — Other Ambulatory Visit: Payer: Self-pay

## 2021-09-29 DIAGNOSIS — B20 Human immunodeficiency virus [HIV] disease: Secondary | ICD-10-CM | POA: Diagnosis not present

## 2021-09-29 MED ORDER — CABOTEGRAVIR & RILPIVIRINE ER 600 & 900 MG/3ML IM SUER
1.0000 | Freq: Once | INTRAMUSCULAR | Status: AC
  Administered 2021-09-29: 1 via INTRAMUSCULAR

## 2021-09-29 NOTE — Progress Notes (Signed)
HPI: Curtis Trevino is a 56 y.o. male who presents to the [facility] for [place] administration.  Patient Active Problem List   Diagnosis Date Noted   COVID 04/26/2021   Migraine 09/01/2020   Therapeutic drug monitoring 07/20/2020   Rectal bleeding 07/20/2020   HIV disease ([place]) 02/16/2020   Healthcare maintenance 02/16/2020   Immunization counseling 02/16/2020   Syphilis 02/16/2020   High risk sexual behavior 02/16/2020   Polyarthralgia 04/23/2019   Prediabetes 04/23/2019   Chronic pain of both knees 03/24/2019   Bilateral hand pain 03/24/2019   Pain of great toe, right 03/24/2019    Patient's Medications  New Prescriptions   No medications on file  Previous Medications   CABOTEGRAVIR & RILPIVIRINE ER (CABENUVA) 600 & 900 MG/3ML INJECTION    Inject 1 kit into the muscle every 30 (thirty) days.   CABOTEGRAVIR & RILPIVIRINE ER (CABENUVA) 600 & 900 MG/3ML INJECTION    Inject 1 kit into the muscle every 2 (two) months.   DICLOFENAC SODIUM (VOLTAREN) 1 % GEL    Apply 4 g topically 4 (four) times daily.   DULOXETINE (CYMBALTA) 20 MG CAPSULE    Take 1 capsule (20 mg total) by mouth daily.   TAMSULOSIN (FLOMAX) 0.4 MG CAPS CAPSULE    Take 1 capsule (0.4 mg total) by mouth daily.   TOPIRAMATE (TOPAMAX) 25 MG TABLET    Take 1 tablet by mouth twice daily  Modified Medications   No medications on file  Discontinued Medications   No medications on file    Allergies: Allergies  Allergen Reactions   Codeine Hives, Itching and Rash   Tylenol [Acetaminophen] Nausea And Vomiting    Vomiting every time he takes tylenol    Past Medical History: Past Medical History:  Diagnosis Date   Acid reflux    Chronic back pain     Social History: Social History   Socioeconomic History   Marital status: Single    Spouse name: Not on file   Number of children: Not on file   Years of education: Not on file   Highest education level: Not on file  Occupational History   Not  on file  Tobacco Use   Smoking status: Former    Types: Cigarettes    Quit date: 11/15/2010    Years since quitting: 10.8   Smokeless tobacco: Never  Substance and Sexual Activity   Alcohol use: Not Currently    Alcohol/week: 3.0 standard drinks of alcohol    Types: 3 Shots of liquor per week    Comment: 3 drink a day   Drug use: No   Sexual activity: Yes    Partners: Male    Comment: declined condoms  Other Topics Concern   Not on file  Social History Narrative   Not on file   Social Determinants of Health   Financial Resource Strain: Not on file  Food Insecurity: Not on file  Transportation Needs: Not on file  Physical Activity: Not on file  Stress: Not on file  Social Connections: Not on file    Labs: Lab Results  Component Value Date   HIV1RNAQUANT NOT DETECTED 08/02/2021   HIV1RNAQUANT Not Detected 06/02/2021   HIV1RNAQUANT Not Detected 05/02/2021   CD4TABS 839 12/14/2020   CD4TABS 864 07/20/2020   CD4TABS 819 04/21/2020    RPR and STI Lab Results  Component Value Date   LABRPR REACTIVE (A) 08/02/2021   LABRPR REACTIVE (A) 10/27/2020   LABRPR REACTIVE (A)  07/20/2020   LABRPR REACTIVE (A) 06/08/2020   LABRPR REACTIVE (A) 04/21/2020   RPRTITER 1:2 (H) 08/02/2021   RPRTITER 1:2 (H) 10/27/2020   RPRTITER 1:2 (H) 07/20/2020   RPRTITER 1:8 (H) 06/08/2020   RPRTITER 1:4 (H) 04/21/2020    STI Results GC CT  08/02/2021  3:30 PM Negative  Negative   10/27/2020  9:07 AM Negative    Negative    Negative  Negative    Positive    Negative   06/08/2020  3:29 PM Negative    Negative    Negative  Negative    Negative    Negative   02/16/2020  9:51 AM Negative  Negative   02/16/2020  9:20 AM Negative  Negative   01/30/2020  9:34 AM Negative  Negative   06/18/2019 10:32 AM Positive    Positive    Negative  Negative    Negative    Negative   06/13/2018 12:00 AM Negative    Negative    Negative  Negative    Negative    Negative   03/18/2018 12:00 AM  **POSITIVE**    Negative    Negative  **POSITIVE**    Negative    Negative   09/18/2017 12:00 AM Negative    Negative    Negative  Negative    Negative    Negative   08/23/2017 12:00 AM **POSITIVE**    Negative    Negative  Negative    Negative    Negative     Hepatitis B Lab Results  Component Value Date   HEPBSAB REACTIVE (A) 07/20/2020   HEPBSAG NON-REACTIVE 07/20/2020   HEPBCAB NON-REACTIVE 07/20/2020   Hepatitis C Lab Results  Component Value Date   HEPCAB NON-REACTIVE 01/30/2020   Hepatitis A Lab Results  Component Value Date   HAV REACTIVE (A) 01/30/2020   Lipids: Lab Results  Component Value Date   CHOL 191 01/30/2020   TRIG 193 (H) 01/30/2020   HDL 42 01/30/2020   CHOLHDL 4.5 01/30/2020   LDLCALC 118 (H) 01/30/2020    TARGET DATE: The 23rd  Assessment: Curtis Trevino presents today for his maintenance Cabenuva injections. Past injections were tolerated well without issues. Asking to defer labs today until he sees Dr. Juleen China in August.   Administered cabotegravir 621m/3mL in left upper outer quadrant of the gluteal muscle. Administered rilpivirine 900 mg/362min the right upper outer quadrant of the gluteal muscle. No issues with injections. He will follow up in 2 months for next set of injections.  Plan: - Cabenuva injections administered - Next injections scheduled for 11/29/21 with Dr. WaJuleen Chinand 01/25/22 with me - Call with any issues or questions  [name] L. Niylah Hassan, PharmD, BCIDP, AAHIVP, CPMitchellvillelinical Pharmacist Practitioner [place]

## 2021-10-06 ENCOUNTER — Ambulatory Visit: Payer: BC Managed Care – PPO | Admitting: Specialist

## 2021-11-14 ENCOUNTER — Other Ambulatory Visit (HOSPITAL_COMMUNITY): Payer: Self-pay

## 2021-11-15 ENCOUNTER — Other Ambulatory Visit (HOSPITAL_COMMUNITY): Payer: Self-pay

## 2021-11-16 ENCOUNTER — Other Ambulatory Visit (HOSPITAL_COMMUNITY): Payer: Self-pay

## 2021-11-18 ENCOUNTER — Other Ambulatory Visit (HOSPITAL_COMMUNITY): Payer: Self-pay

## 2021-11-21 ENCOUNTER — Other Ambulatory Visit: Payer: Self-pay

## 2021-11-21 ENCOUNTER — Ambulatory Visit: Payer: Self-pay

## 2021-11-21 ENCOUNTER — Other Ambulatory Visit (HOSPITAL_COMMUNITY): Payer: Self-pay

## 2021-11-21 ENCOUNTER — Telehealth: Payer: Self-pay

## 2021-11-21 NOTE — Telephone Encounter (Signed)
[facility] Patient Advocate Encounter  Completed and sent [employer] Patient Assistance application for Cabenuva for this patient who is uninsured.    Patient assistance phone number for follow up is [phone].   This encounter will be updated until final determination.   [name], [facility] Patient [facility] for Infectious Disease Phone: [phone] Fax:  [phone]

## 2021-11-28 ENCOUNTER — Telehealth: Payer: Self-pay

## 2021-11-28 NOTE — Telephone Encounter (Signed)
[facility] Patient Advocate Encounter  Completed and sent [employer] Patient Assistance application for Cabenuva for this patient who is uninsured.    Patient is approved 11/26/21 through 11/27/22.  Medication will be delivered to the clinic   Curtis Trevino, [place] Patient [facility] for Infectious Disease Phone: [phone] Fax:  [phone]

## 2021-11-28 NOTE — Telephone Encounter (Signed)
He is good to get his injection tomorrow in case you see this message Dr. Juleen China, [facility].

## 2021-11-29 ENCOUNTER — Telehealth: Payer: Self-pay

## 2021-11-29 ENCOUNTER — Other Ambulatory Visit: Payer: Self-pay

## 2021-11-29 ENCOUNTER — Ambulatory Visit (INDEPENDENT_AMBULATORY_CARE_PROVIDER_SITE_OTHER): Payer: Self-pay | Admitting: Internal Medicine

## 2021-11-29 ENCOUNTER — Encounter: Payer: Self-pay | Admitting: Internal Medicine

## 2021-11-29 DIAGNOSIS — B20 Human immunodeficiency virus [HIV] disease: Secondary | ICD-10-CM

## 2021-11-29 DIAGNOSIS — Z113 Encounter for screening for infections with a predominantly sexual mode of transmission: Secondary | ICD-10-CM

## 2021-11-29 DIAGNOSIS — A539 Syphilis, unspecified: Secondary | ICD-10-CM

## 2021-11-29 DIAGNOSIS — R7303 Prediabetes: Secondary | ICD-10-CM

## 2021-11-29 DIAGNOSIS — G43809 Other migraine, not intractable, without status migrainosus: Secondary | ICD-10-CM

## 2021-11-29 MED ORDER — CABOTEGRAVIR & RILPIVIRINE ER 600 & 900 MG/3ML IM SUER
1.0000 | Freq: Once | INTRAMUSCULAR | Status: AC
  Administered 2021-11-29: 1 via INTRAMUSCULAR

## 2021-11-29 NOTE — Telephone Encounter (Signed)
[facility] Patient Advocate Encounter  Patient's medication [name]) have been couriered to [facility] from [employer] and will be administered on the patient next office visit on 11/29/21.  [name] , [facility] Patient [facility] for Infectious Disease Phone: [phone] Fax:  [phone]

## 2021-11-29 NOTE — Patient Instructions (Signed)
Thank you for coming to see me today. It was a pleasure seeing you.  To Do: Labs today [place] shot Discuss with PCP regarding headaches and weight gain.  Maybe consider referral to neurology or weight loss clinic  If you have any questions or concerns, please do not hesitate to call the office at [phone].  Take Care,   Jule Ser

## 2021-11-29 NOTE — Assessment & Plan Note (Signed)
Screening offered and he declines today.

## 2021-11-29 NOTE — Addendum Note (Signed)
Addended by: Lucie Leather D on: 11/29/2021 10:21 AM   Modules accepted: Orders

## 2021-11-29 NOTE — Assessment & Plan Note (Signed)
Here today for routine HIV follow up and Cabenuva injection.  Doing well and tolerating injections.  Last one was on 09/29/21.  Viral load was undetectable in April 2023 and CD 4 count 839 in September 2022. Will administer Cabenuva today and check labs.  Follow up in 2 months with Cassie and 4 months with myself.

## 2021-11-29 NOTE — Addendum Note (Signed)
Addended by: Caffie Pinto on: 11/29/2021 10:02 AM   Modules accepted: Orders

## 2021-11-29 NOTE — Progress Notes (Signed)
[facility]   CHIEF COMPLAINT    HIV follow up.    SUBJECTIVE:    Curtis Trevino is a 56 y.o. male with PMHx as below who presents to the clinic for HIV follow up.   Please see A&P for the details of today's visit and status of the patient's medical problems.   Patient's Medications  New Prescriptions   No medications on file  Previous Medications   ASPIRIN-ACETAMINOPHEN-CAFFEINE (EXCEDRIN MIGRAINE) 250-250-65 MG TABLET    Take by mouth every 6 (six) hours as needed for headache.   CABOTEGRAVIR & RILPIVIRINE ER (CABENUVA) 600 & 900 MG/3ML INJECTION    Inject 1 kit into the muscle every 30 (thirty) days.   CABOTEGRAVIR & RILPIVIRINE ER (CABENUVA) 600 & 900 MG/3ML INJECTION    Inject 1 kit into the muscle every 2 (two) months.   DICLOFENAC SODIUM (VOLTAREN) 1 % GEL    Apply 4 g topically 4 (four) times daily.   DULOXETINE (CYMBALTA) 20 MG CAPSULE    Take 1 capsule (20 mg total) by mouth daily.   TAMSULOSIN (FLOMAX) 0.4 MG CAPS CAPSULE    Take 1 capsule (0.4 mg total) by mouth daily.   TOPIRAMATE (TOPAMAX) 25 MG TABLET    Take 1 tablet by mouth twice daily  Modified Medications   No medications on file  Discontinued Medications   No medications on file      Past Medical History:  Diagnosis Date   Acid reflux    Chronic back pain     Social History   Tobacco Use   Smoking status: Former    Types: Cigarettes    Quit date: 11/15/2010    Years since quitting: 11.0   Smokeless tobacco: Never  Substance Use Topics   Alcohol use: Not Currently    Alcohol/week: 3.0 standard drinks of alcohol    Types: 3 Shots of liquor per week    Comment: 3 drink a day   Drug use: No    Family History  Problem Relation Age of Onset   Diabetes Mother    Heart failure Mother    Cancer Mother    Heart failure Father    Cancer Father     Allergies  Allergen Reactions   Codeine Hives, Itching and Rash   Tylenol [Acetaminophen] Nausea And  Vomiting    Vomiting every time he takes tylenol    Review of Systems  Neurological:  Positive for headaches.  Endo/Heme/Allergies:        + Weight gain.  All other systems reviewed and are negative.    OBJECTIVE:    Vitals:   11/29/21 0936  BP: 121/82  Pulse: 99  Temp: 97.8 F (36.6 C)  TempSrc: Oral  SpO2: 96%  Weight: 203 lb (92.1 kg)  Height: '5\' 8"'  (1.727 m)     Body mass index is 30.87 kg/m.  Physical Exam Constitutional:      Appearance: Normal appearance.  HENT:     Head: Normocephalic and atraumatic.  Pulmonary:     Effort: Pulmonary effort is normal. No respiratory distress.  Musculoskeletal:        General: Normal range of motion.  Skin:    General: Skin is warm and dry.  Neurological:     General: No focal deficit present.     Mental Status: He is alert and oriented to person, place, and time.  Psychiatric:        Mood  and Affect: Mood normal.        Behavior: Behavior normal.     Labs and Microbiology:    Latest Ref Rng & Units 12/14/2020   10:26 AM 10/13/2020    1:57 PM 07/20/2020    9:25 AM  CMP  Glucose 65 - 99 mg/dL 116  108  114   BUN 7 - 25 mg/dL '21  22  21   ' Creatinine 0.70 - 1.30 mg/dL 1.52  1.54  1.44   Sodium 135 - 146 mmol/L 139  140  141   Potassium 3.5 - 5.3 mmol/L 4.6  4.7  4.1   Chloride 98 - 110 mmol/L 107  102  106   CO2 20 - 32 mmol/L '26  24  25   ' Calcium 8.6 - 10.3 mg/dL 10.1  9.4  9.2   Total Protein 6.1 - 8.1 g/dL 7.4   6.7   Total Bilirubin 0.2 - 1.2 mg/dL 0.4   0.2   AST 10 - 35 U/L 19   18   ALT 9 - 46 U/L 17   16       Latest Ref Rng & Units 12/14/2020   10:26 AM 07/20/2020    9:25 AM 04/21/2020    3:04 PM  CBC  WBC 3.8 - 10.8 Thousand/uL 6.1  6.7  7.1   Hemoglobin 13.2 - 17.1 g/dL 14.7  14.5  14.5   Hematocrit 38.5 - 50.0 % 44.7  44.3  42.7   Platelets 140 - 400 Thousand/uL 250  286  285      Lab Results  Component Value Date   HIV1RNAQUANT NOT DETECTED 08/02/2021   HIV1RNAQUANT Not Detected 06/02/2021    HIV1RNAQUANT Not Detected 05/02/2021   CD4TABS 839 12/14/2020   CD4TABS 864 07/20/2020   CD4TABS 819 04/21/2020    RPR and STI: Lab Results  Component Value Date   LABRPR REACTIVE (A) 08/02/2021   LABRPR REACTIVE (A) 10/27/2020   LABRPR REACTIVE (A) 07/20/2020   LABRPR REACTIVE (A) 06/08/2020   LABRPR REACTIVE (A) 04/21/2020   RPRTITER 1:2 (H) 08/02/2021   RPRTITER 1:2 (H) 10/27/2020   RPRTITER 1:2 (H) 07/20/2020   RPRTITER 1:8 (H) 06/08/2020   RPRTITER 1:4 (H) 04/21/2020    STI Results GC CT  08/02/2021  3:30 PM Negative  Negative   10/27/2020  9:07 AM Negative    Negative    Negative  Negative    Positive    Negative   06/08/2020  3:29 PM Negative    Negative    Negative  Negative    Negative    Negative   02/16/2020  9:51 AM Negative  Negative   02/16/2020  9:20 AM Negative  Negative   01/30/2020  9:34 AM Negative  Negative   06/18/2019 10:32 AM Positive    Positive    Negative  Negative    Negative    Negative   06/13/2018 12:00 AM Negative    Negative    Negative  Negative    Negative    Negative   03/18/2018 12:00 AM **POSITIVE**    Negative    Negative  **POSITIVE**    Negative    Negative   09/18/2017 12:00 AM Negative    Negative    Negative  Negative    Negative    Negative   08/23/2017 12:00 AM **POSITIVE**    Negative    Negative  Negative    Negative    Negative     Hepatitis  B: Lab Results  Component Value Date   HEPBSAB REACTIVE (A) 07/20/2020   HEPBSAG NON-REACTIVE 07/20/2020   HEPBCAB NON-REACTIVE 07/20/2020   Hepatitis C: Lab Results  Component Value Date   HEPCAB NON-REACTIVE 01/30/2020   Hepatitis A: Lab Results  Component Value Date   HAV REACTIVE (A) 01/30/2020   Lipids: Lab Results  Component Value Date   CHOL 191 01/30/2020   TRIG 193 (H) 01/30/2020   HDL 42 01/30/2020   CHOLHDL 4.5 01/30/2020   LDLCALC 118 (H) 01/30/2020    Imaging:    ASSESSMENT & PLAN:    HIV disease ([place]) Here today for routine  HIV follow up and Cabenuva injection.  Doing well and tolerating injections.  Last one was on 09/29/21.  Viral load was undetectable in April 2023 and CD 4 count 839 in September 2022. Will administer Cabenuva today and check labs.  Follow up in 2 months with Cassie and 4 months with myself.   Syphilis Stable RPR titer 1:2.  No new concerns and will repeat today.  Routine screening for STI (sexually transmitted infection) Screening offered and he declines today.   Prediabetes He would like repeat A1c and will check today.   Migraine Reports symptoms have not been well controlled lately.  Recommend follow up with PCP and will check CMP today for renal function as he reports increased use of Excedrin.   Orders Placed This Encounter  Procedures   RPR   HIV-1 RNA quant-no reflex-bld   T-helper cell (CD4)- ([facility] clinic only)   COMPLETE METABOLIC PANEL WITH GFR   HgB A1c        Raynelle [facility] for Infectious [facility] 11/29/2021, 9:58 AM

## 2021-11-29 NOTE — Assessment & Plan Note (Signed)
Stable RPR titer 1:2.  No new concerns and will repeat today.

## 2021-11-29 NOTE — Assessment & Plan Note (Signed)
Reports symptoms have not been well controlled lately.  Recommend follow up with PCP and will check CMP today for renal function as he reports increased use of Excedrin.

## 2021-11-29 NOTE — Assessment & Plan Note (Signed)
He would like repeat A1c and will check today.

## 2021-11-30 LAB — T-HELPER CELL (CD4) - (RCID CLINIC ONLY)
CD4 % Helper T Cell: 41 % (ref 33–65)
CD4 T Cell Abs: 849 /uL (ref 400–1790)

## 2021-12-02 LAB — RPR: RPR Ser Ql: REACTIVE — AB

## 2021-12-02 LAB — RPR TITER: RPR Titer: 1:1 {titer} — ABNORMAL HIGH

## 2021-12-02 LAB — COMPLETE METABOLIC PANEL WITH GFR
AG Ratio: 1.3 (calc) (ref 1.0–2.5)
ALT: 25 U/L (ref 9–46)
AST: 23 U/L (ref 10–35)
Albumin: 4.2 g/dL (ref 3.6–5.1)
Alkaline phosphatase (APISO): 44 U/L (ref 35–144)
BUN: 16 mg/dL (ref 7–25)
CO2: 23 mmol/L (ref 20–32)
Calcium: 9.7 mg/dL (ref 8.6–10.3)
Chloride: 108 mmol/L (ref 98–110)
Creat: 1.29 mg/dL (ref 0.70–1.30)
Globulin: 3.3 g/dL (calc) (ref 1.9–3.7)
Glucose, Bld: 111 mg/dL — ABNORMAL HIGH (ref 65–99)
Potassium: 4.2 mmol/L (ref 3.5–5.3)
Sodium: 141 mmol/L (ref 135–146)
Total Bilirubin: 0.3 mg/dL (ref 0.2–1.2)
Total Protein: 7.5 g/dL (ref 6.1–8.1)
eGFR: 66 mL/min/{1.73_m2} (ref >=60)

## 2021-12-02 LAB — HEMOGLOBIN A1C
Hgb A1c MFr Bld: 5.6 % of total Hgb (ref <5.7)
Mean Plasma Glucose: 114 mg/dL
eAG (mmol/L): 6.3 mmol/L

## 2021-12-02 LAB — FLUORESCENT TREPONEMAL AB(FTA)-IGG-BLD: Fluorescent Treponemal ABS: REACTIVE — AB

## 2021-12-02 LAB — HIV-1 RNA QUANT-NO REFLEX-BLD
HIV 1 RNA Quant: NOT DETECTED Copies/mL
HIV-1 RNA Quant, Log: NOT DETECTED Log cps/mL

## 2022-01-19 ENCOUNTER — Telehealth: Payer: Self-pay

## 2022-01-19 NOTE — Telephone Encounter (Signed)
[facility] Patient Advocate Encounter  Patient's medication [name]) have been couriered to [facility] from [employer] and will be administered on the patient next office visit on 01/25/22.  [name] , [facility] Patient [facility] for Infectious Disease Phone: [phone] Fax:  [phone]

## 2022-01-25 ENCOUNTER — Encounter: Payer: Self-pay | Admitting: Pharmacist

## 2022-01-30 ENCOUNTER — Ambulatory Visit (INDEPENDENT_AMBULATORY_CARE_PROVIDER_SITE_OTHER): Payer: Self-pay | Admitting: Pharmacist

## 2022-01-30 ENCOUNTER — Other Ambulatory Visit: Payer: Self-pay

## 2022-01-30 ENCOUNTER — Ambulatory Visit (INDEPENDENT_AMBULATORY_CARE_PROVIDER_SITE_OTHER): Payer: Self-pay

## 2022-01-30 DIAGNOSIS — B20 Human immunodeficiency virus [HIV] disease: Secondary | ICD-10-CM

## 2022-01-30 DIAGNOSIS — Z23 Encounter for immunization: Secondary | ICD-10-CM

## 2022-01-30 MED ORDER — CABOTEGRAVIR & RILPIVIRINE ER 600 & 900 MG/3ML IM SUER
1.0000 | Freq: Once | INTRAMUSCULAR | Status: AC
  Administered 2022-01-30: 1 via INTRAMUSCULAR

## 2022-01-30 NOTE — Progress Notes (Signed)
HPI: Curtis Trevino is a 56 y.o. male who presents to the [facility] for [place] administration.  Patient Active Problem List   Diagnosis Date Noted   Routine screening for STI (sexually transmitted infection) 11/29/2021   COVID 04/26/2021   Migraine 09/01/2020   Therapeutic drug monitoring 07/20/2020   Rectal bleeding 07/20/2020   HIV disease ([place]) 02/16/2020   Healthcare maintenance 02/16/2020   Immunization counseling 02/16/2020   Syphilis 02/16/2020   High risk sexual behavior 02/16/2020   Polyarthralgia 04/23/2019   Prediabetes 04/23/2019   Chronic pain of both knees 03/24/2019   Bilateral hand pain 03/24/2019   Pain of great toe, right 03/24/2019    Patient's Medications  New Prescriptions   No medications on file  Previous Medications   ASPIRIN-ACETAMINOPHEN-CAFFEINE (EXCEDRIN MIGRAINE) 250-250-65 MG TABLET    Take by mouth every 6 (six) hours as needed for headache.   CABOTEGRAVIR & RILPIVIRINE ER (CABENUVA) 600 & 900 MG/3ML INJECTION    Inject 1 kit into the muscle every 30 (thirty) days.   CABOTEGRAVIR & RILPIVIRINE ER (CABENUVA) 600 & 900 MG/3ML INJECTION    Inject 1 kit into the muscle every 2 (two) months.   DICLOFENAC SODIUM (VOLTAREN) 1 % GEL    Apply 4 g topically 4 (four) times daily.   DULOXETINE (CYMBALTA) 20 MG CAPSULE    Take 1 capsule (20 mg total) by mouth daily.   TAMSULOSIN (FLOMAX) 0.4 MG CAPS CAPSULE    Take 1 capsule (0.4 mg total) by mouth daily.   TOPIRAMATE (TOPAMAX) 25 MG TABLET    Take 1 tablet by mouth twice daily  Modified Medications   No medications on file  Discontinued Medications   No medications on file    Allergies: Allergies  Allergen Reactions   Codeine Hives, Itching and Rash   Tylenol [Acetaminophen] Nausea And Vomiting    Vomiting every time he takes tylenol    Past Medical History: Past Medical History:  Diagnosis Date   Acid reflux    Chronic back pain     Social History: Social History    Socioeconomic History   Marital status: Single    Spouse name: Not on file   Number of children: Not on file   Years of education: Not on file   Highest education level: Not on file  Occupational History   Not on file  Tobacco Use   Smoking status: Former    Types: Cigarettes    Quit date: 11/15/2010    Years since quitting: 11.2   Smokeless tobacco: Never  Substance and Sexual Activity   Alcohol use: Not Currently    Alcohol/week: 3.0 standard drinks of alcohol    Types: 3 Shots of liquor per week    Comment: 3 drink a day   Drug use: No   Sexual activity: Yes    Partners: Male    Comment: declined condoms  Other Topics Concern   Not on file  Social History Narrative   Not on file   Social Determinants of Health   Financial Resource Strain: Not on file  Food Insecurity: Not on file  Transportation Needs: Not on file  Physical Activity: Not on file  Stress: Not on file  Social Connections: Not on file    Labs: Lab Results  Component Value Date   HIV1RNAQUANT Not Detected 11/29/2021   HIV1RNAQUANT NOT DETECTED 08/02/2021   HIV1RNAQUANT Not Detected 06/02/2021   CD4TABS 849 11/29/2021   CD4TABS 839 12/14/2020  CD4TABS 864 07/20/2020    RPR and STI Lab Results  Component Value Date   LABRPR REACTIVE (A) 11/29/2021   LABRPR REACTIVE (A) 08/02/2021   LABRPR REACTIVE (A) 10/27/2020   LABRPR REACTIVE (A) 07/20/2020   LABRPR REACTIVE (A) 06/08/2020   RPRTITER 1:1 (H) 11/29/2021   RPRTITER 1:2 (H) 08/02/2021   RPRTITER 1:2 (H) 10/27/2020   RPRTITER 1:2 (H) 07/20/2020   RPRTITER 1:8 (H) 06/08/2020    STI Results GC CT  08/02/2021  3:30 PM Negative  Negative   10/27/2020  9:07 AM Negative    Negative    Negative  Negative    Positive    Negative   06/08/2020  3:29 PM Negative    Negative    Negative  Negative    Negative    Negative   02/16/2020  9:51 AM Negative  Negative   02/16/2020  9:20 AM Negative  Negative   01/30/2020  9:34 AM Negative   Negative   06/18/2019 10:32 AM Positive    Positive    Negative  Negative    Negative    Negative   06/13/2018 12:00 AM Negative    Negative    Negative  Negative    Negative    Negative   03/18/2018 12:00 AM **POSITIVE**    Negative    Negative  **POSITIVE**    Negative    Negative   09/18/2017 12:00 AM Negative    Negative    Negative  Negative    Negative    Negative   08/23/2017 12:00 AM **POSITIVE**    Negative    Negative  Negative    Negative    Negative     Hepatitis B Lab Results  Component Value Date   HEPBSAB REACTIVE (A) 07/20/2020   HEPBSAG NON-REACTIVE 07/20/2020   HEPBCAB NON-REACTIVE 07/20/2020   Hepatitis C Lab Results  Component Value Date   HEPCAB NON-REACTIVE 01/30/2020   Hepatitis A Lab Results  Component Value Date   HAV REACTIVE (A) 01/30/2020   Lipids: Lab Results  Component Value Date   CHOL 191 01/30/2020   TRIG 193 (H) 01/30/2020   HDL 42 01/30/2020   CHOLHDL 4.5 01/30/2020   LDLCALC 118 (H) 01/30/2020    TARGET DATE: The 23rd  Assessment: Curtis Trevino presents today for his maintenance Cabenuva injections. Past injections were tolerated well without issues except for mild burning with the rilpivirine. Discussed his lab work from last visit. All labs were normal. Interested in receiving new COVID and flu vaccines today. Politely declines STI testing.  Administered cabotegravir 62m/3mL in left upper outer quadrant of the gluteal muscle. Administered rilpivirine 900 mg/361min the right upper outer quadrant of the gluteal muscle. No issues with injections. He will follow up in 2 months for next set of injections.  Plan: - Cabenuva injections administered - Flu vaccine and COVID vaccine administered - Next injections scheduled for 03/29/22 with Dr. WaJuleen Chinand 05/29/22 with me - Call with any issues or questions  Curtis Trevino L. Sherena Machorro, PharmD, BCIDP, AAHIVP, CPPilgerlinical Pharmacist Practitioner [place] [facility]

## 2022-03-14 ENCOUNTER — Other Ambulatory Visit: Payer: Self-pay | Admitting: Pharmacist

## 2022-03-14 DIAGNOSIS — B20 Human immunodeficiency virus [HIV] disease: Secondary | ICD-10-CM

## 2022-03-14 MED ORDER — CABOTEGRAVIR & RILPIVIRINE ER 600 & 900 MG/3ML IM SUER: 1.0000 | INTRAMUSCULAR | 5 refills | Status: DC

## 2022-03-24 ENCOUNTER — Telehealth: Payer: Self-pay

## 2022-03-24 NOTE — Telephone Encounter (Signed)
[facility] Patient Advocate Encounter  Patient's medication [name]) have been couriered to [facility] from [employer] and will be delivered on the patient next office visit on 03/29/22.  [name] , [facility] Patient [facility] for Infectious Disease Phone: [phone] Fax:  [phone]

## 2022-03-29 ENCOUNTER — Other Ambulatory Visit: Payer: Self-pay

## 2022-03-29 ENCOUNTER — Encounter: Payer: Self-pay | Admitting: Internal Medicine

## 2022-03-29 ENCOUNTER — Ambulatory Visit (INDEPENDENT_AMBULATORY_CARE_PROVIDER_SITE_OTHER): Payer: Self-pay | Admitting: Internal Medicine

## 2022-03-29 VITALS — BP 109/84 | HR 114 | Temp 98.3°F | Wt 194.0 lb

## 2022-03-29 DIAGNOSIS — A539 Syphilis, unspecified: Secondary | ICD-10-CM

## 2022-03-29 DIAGNOSIS — B20 Human immunodeficiency virus [HIV] disease: Secondary | ICD-10-CM

## 2022-03-29 DIAGNOSIS — R197 Diarrhea, unspecified: Secondary | ICD-10-CM

## 2022-03-29 DIAGNOSIS — Z113 Encounter for screening for infections with a predominantly sexual mode of transmission: Secondary | ICD-10-CM

## 2022-03-29 DIAGNOSIS — Z7185 Encounter for immunization safety counseling: Secondary | ICD-10-CM

## 2022-03-29 MED ORDER — CABOTEGRAVIR & RILPIVIRINE ER 600 & 900 MG/3ML IM SUER
1.0000 | Freq: Once | INTRAMUSCULAR | Status: AC
  Administered 2022-03-29: 1 via INTRAMUSCULAR

## 2022-03-29 NOTE — Assessment & Plan Note (Signed)
Stable RPR titer at 1:1 last time.  Will repeat today.

## 2022-03-29 NOTE — Addendum Note (Signed)
Addended by: Tomi Bamberger on: 03/29/2022 03:40 PM   Modules accepted: Orders

## 2022-03-29 NOTE — Assessment & Plan Note (Signed)
Unclear etiology although seems possibly viral in etiology but with prolonged symptoms.  Will check GI PCR panel and C diff today.  Imodium advised as needed.  If work up unrevealing and symptoms persist, may need GI referral.

## 2022-03-29 NOTE — Assessment & Plan Note (Signed)
Here today for routine HIV follow up and doing well on Cabenuva.  His last injection was on 01/30/22 with pharmacy.  Labs in August were reassuring and will plan for repeating them today.  Follow up in 2 months with pharmacy and 4 months with myself.

## 2022-03-29 NOTE — Assessment & Plan Note (Signed)
Received COVID and flu vaccine this year.

## 2022-03-29 NOTE — Patient Instructions (Signed)
Thank you for coming to see me today. It was a pleasure seeing you.  To Do: Cabenuva today and labs Stool studies to look for infectious cause of diarrhea Will call with results as they become available I would use Imodium as needed if you can tolerate it If infectious cause not identified we'll think about GI referral  If you have any questions or concerns, please do not hesitate to call the office at [phone].  Take Care,   Jule Ser

## 2022-03-29 NOTE — Assessment & Plan Note (Signed)
Screening offered and he declined.

## 2022-03-29 NOTE — Progress Notes (Signed)
Curtis Trevino for Infectious Disease   CHIEF COMPLAINT    HIV follow up.   SUBJECTIVE:    Curtis Trevino is a 56 y.o. male with PMHx as below who presents to the clinic for HIV follow up.   His main complaint today is a diarrheal illness.  He reports about 3 weeks ago he had viral URI symptoms.  7 days following this, he developed diarrheal symptoms that have persisted for about 16 days now.  He is having 4-5 episodes per day of watery diarrhea.  Imodium provided no relief and he is no longer taking it.  He reports abdominal bloating but no significant abdominal pain.  He reports food seems to make it worse.  He has also reported some fevers to coincide with his symptoms.  His partner was tested for STI and is awaiting results but this is the only person he has been sexually active with.  He reports no antibiotic use recently.  Please see A&P for the details of today's visit and status of the patient's medical problems.   Patient's Medications  New Prescriptions   No medications on file  Previous Medications   ASPIRIN-ACETAMINOPHEN-CAFFEINE (EXCEDRIN MIGRAINE) 250-250-65 MG TABLET    Take by mouth every 6 (six) hours as needed for headache.   CABOTEGRAVIR & RILPIVIRINE ER (CABENUVA) 600 & 900 MG/3ML INJECTION    Inject 1 kit into the muscle every 2 (two) months.   CABOTEGRAVIR & RILPIVIRINE ER (CABENUVA) 600 & 900 MG/3ML INJECTION    Inject 1 kit into the muscle every 2 (two) months.   DICLOFENAC SODIUM (VOLTAREN) 1 % GEL    Apply 4 g topically 4 (four) times daily.   DULOXETINE (CYMBALTA) 20 MG CAPSULE    Take 1 capsule (20 mg total) by mouth daily.   TAMSULOSIN (FLOMAX) 0.4 MG CAPS CAPSULE    Take 1 capsule (0.4 mg total) by mouth daily.   TOPIRAMATE (TOPAMAX) 25 MG TABLET    Take 1 tablet by mouth twice daily  Modified Medications   No medications on file  Discontinued Medications   No medications on file      Past Medical History:  Diagnosis Date   Acid reflux     Chronic back pain     Social History   Tobacco Use   Smoking status: Former    Types: Cigarettes    Quit date: 11/15/2010    Years since quitting: 11.3   Smokeless tobacco: Never  Substance Use Topics   Alcohol use: Not Currently    Alcohol/week: 3.0 standard drinks of alcohol    Types: 3 Shots of liquor per week    Comment: 3 drink a day   Drug use: No    Family History  Problem Relation Age of Onset   Diabetes Mother    Heart failure Mother    Cancer Mother    Heart failure Father    Cancer Father     Allergies  Allergen Reactions   Codeine Hives, Itching and Rash   Tylenol [Acetaminophen] Nausea And Vomiting    Vomiting every time he takes tylenol    Review of Systems  All other systems reviewed and are negative.    OBJECTIVE:    Vitals:   03/29/22 1446  BP: 109/84  Pulse: (!) 114  Temp: 98.3 F (36.8 C)  TempSrc: Oral  SpO2: 93%  Weight: 194 lb (88 kg)     Body mass index is 29.5 kg/m.  Physical Exam Constitutional:      Appearance: Normal appearance.  Pulmonary:     Effort: Pulmonary effort is normal. No respiratory distress.  Abdominal:     General: There is no distension.     Palpations: Abdomen is soft.     Tenderness: There is no guarding.  Musculoskeletal:        General: Normal range of motion.  Skin:    General: Skin is warm and dry.  Neurological:     General: No focal deficit present.     Mental Status: He is alert and oriented to person, place, and time.  Psychiatric:        Mood and Affect: Mood normal.        Behavior: Behavior normal.     Labs and Microbiology:    Latest Ref Rng & Units 11/29/2021   10:01 AM 12/14/2020   10:26 AM 10/13/2020    1:57 PM  CMP  Glucose 65 - 99 mg/dL 111  116  108   BUN 7 - 25 mg/dL _0 Creatinine 0.70 - 1.30 mg/dL 1.29  1.52  1.54   Sodium 135 - 146 mmol/L 141  139  140   Potassium 3.5 - 5.3 mmol/L 4.2  4.6  4.7   Chloride 98 - 110 mmol/L 108  107  102   CO2 20 - 32 mmol/L _1 Calcium 8.6 - 10.3 mg/dL 9.7  10.1  9.4   Total Protein 6.1 - 8.1 g/dL 7.5  7.4    Total Bilirubin 0.2 - 1.2 mg/dL 0.3  0.4    AST 10 - 35 U/L 23  19    ALT 9 - 46 U/L 25  17        Latest Ref Rng & Units 12/14/2020   10:26 AM 07/20/2020    9:25 AM 04/21/2020    3:04 PM  CBC  WBC 3.8 - 10.8 Thousand/uL 6.1  6.7  7.1   Hemoglobin 13.2 - 17.1 g/dL 14.7  14.5  14.5   Hematocrit 38.5 - 50.0 % 44.7  44.3  42.7   Platelets 140 - 400 Thousand/uL 250  286  285      Lab Results  Component Value Date   HIV1RNAQUANT Not Detected 11/29/2021   HIV1RNAQUANT NOT DETECTED 08/02/2021   HIV1RNAQUANT Not Detected 06/02/2021   CD4TABS 849 11/29/2021   CD4TABS 839 12/14/2020   CD4TABS 864 07/20/2020    RPR and STI: Lab Results  Component Value Date   LABRPR REACTIVE (A) 11/29/2021   LABRPR REACTIVE (A) 08/02/2021   LABRPR REACTIVE (A) 10/27/2020   LABRPR REACTIVE (A) 07/20/2020   LABRPR REACTIVE (A) 06/08/2020   RPRTITER 1:1 (H) 11/29/2021   RPRTITER 1:2 (H) 08/02/2021   RPRTITER 1:2 (H) 10/27/2020   RPRTITER 1:2 (H) 07/20/2020   RPRTITER 1:8 (H) 06/08/2020    STI Results GC CT  08/02/2021  3:30 PM Negative  Negative   10/27/2020  9:07 AM Negative    Negative    Negative  Negative    Positive    Negative   06/08/2020  3:29 PM Negative    Negative    Negative  Negative    Negative    Negative   02/16/2020  9:51 AM Negative  Negative   02/16/2020  9:20 AM Negative  Negative   01/30/2020  9:34 AM Negative  Negative   06/18/2019 10:32 AM Positive    Positive  Negative  Negative    Negative    Negative   06/13/2018 12:00 AM Negative    Negative    Negative  Negative    Negative    Negative   03/18/2018 12:00 AM **POSITIVE**    Negative    Negative  **POSITIVE**    Negative    Negative   09/18/2017 12:00 AM Negative    Negative    Negative  Negative    Negative    Negative   08/23/2017 12:00 AM **POSITIVE**    Negative    Negative  Negative    Negative     Negative     Hepatitis B: Lab Results  Component Value Date   HEPBSAB REACTIVE (A) 07/20/2020   HEPBSAG NON-REACTIVE 07/20/2020   HEPBCAB NON-REACTIVE 07/20/2020   Hepatitis C: Lab Results  Component Value Date   HEPCAB NON-REACTIVE 01/30/2020   Hepatitis A: Lab Results  Component Value Date   HAV REACTIVE (A) 01/30/2020   Lipids: Lab Results  Component Value Date   CHOL 191 01/30/2020   TRIG 193 (H) 01/30/2020   HDL 42 01/30/2020   CHOLHDL 4.5 01/30/2020   LDLCALC 118 (H) 01/30/2020     ASSESSMENT & PLAN:    HIV disease ([place]) Here today for routine HIV follow up and doing well on Cabenuva.  His last injection was on 01/30/22 with pharmacy.  Labs in August were reassuring and will plan for repeating them today.  Follow up in 2 months with pharmacy and 4 months with myself.   Syphilis Stable RPR titer at 1:1 last time.  Will repeat today.  Immunization counseling Received COVID and flu vaccine this year.   Routine screening for STI (sexually transmitted infection) Screening offered and he declined.      Diarrhea Unclear etiology although seems possibly viral in etiology but with prolonged symptoms.  Will check GI PCR panel and C diff today.  Imodium advised as needed.  If work up unrevealing and symptoms persist, may need GI referral.    Orders Placed This Encounter  Procedures   GI pathogen panel by PCR, stool   HIV-1 RNA quant-no reflex-bld   T-helper cell (CD4)- ([facility] clinic only)   RPR   C. difficile GDH and Toxin A/B     Raynelle Highland for Infectious Disease [facility] 03/29/2022, 3:10 PM

## 2022-03-30 LAB — T-HELPER CELL (CD4) - (RCID CLINIC ONLY)
CD4 % Helper T Cell: 43 % (ref 33–65)
CD4 T Cell Abs: 861 /uL (ref 400–1790)

## 2022-04-01 LAB — T PALLIDUM AB: T Pallidum Abs: POSITIVE — AB

## 2022-04-01 LAB — HIV-1 RNA QUANT-NO REFLEX-BLD
HIV 1 RNA Quant: <20 Copies/mL — ABNORMAL HIGH
HIV-1 RNA Quant, Log: <1.3 Log cps/mL — ABNORMAL HIGH

## 2022-04-01 LAB — RPR TITER: RPR Titer: 1:2 {titer} — ABNORMAL HIGH

## 2022-04-01 LAB — RPR: RPR Ser Ql: REACTIVE — AB

## 2022-04-05 ENCOUNTER — Other Ambulatory Visit (HOSPITAL_COMMUNITY): Payer: Self-pay

## 2022-05-09 ENCOUNTER — Other Ambulatory Visit (HOSPITAL_COMMUNITY): Payer: Self-pay

## 2022-05-11 ENCOUNTER — Other Ambulatory Visit (HOSPITAL_COMMUNITY): Payer: Self-pay

## 2022-05-11 ENCOUNTER — Telehealth: Payer: Self-pay

## 2022-05-11 NOTE — Telephone Encounter (Signed)
[facility] Patient Advocate Encounter   Received notification from [employer] that prior authorization for [name] is required.   PA submitted on 05/11/22 Key [id number] Status is pending  PA through [email or web address]    [facility] will continue to follow.   [name], [facility] Patient [facility] for Infectious Disease Phone: [phone] Fax:  [phone]

## 2022-05-11 NOTE — Telephone Encounter (Signed)
[facility] Patient Advocate Encounter  Patient's medication [name]) have been couriered to [facility] from [employer] and will be administered on the patient next office visit on 05/29/22.  [name] , [name] Patient [name] for Infectious Disease Phone: [phone] Fax:  [phone]

## 2022-05-15 ENCOUNTER — Telehealth: Payer: Self-pay

## 2022-05-15 ENCOUNTER — Other Ambulatory Visit: Payer: Self-pay

## 2022-05-15 ENCOUNTER — Other Ambulatory Visit: Payer: Self-pay | Admitting: Pharmacist

## 2022-05-15 ENCOUNTER — Other Ambulatory Visit (HOSPITAL_COMMUNITY): Payer: Self-pay

## 2022-05-15 DIAGNOSIS — B20 Human immunodeficiency virus [HIV] disease: Secondary | ICD-10-CM

## 2022-05-15 MED ORDER — CABOTEGRAVIR & RILPIVIRINE ER 600 & 900 MG/3ML IM SUER
1.0000 | INTRAMUSCULAR | 5 refills | Status: DC
  Filled 2022-05-15 (×2): qty 6, 60d supply, fill #0
  Filled 2022-07-14: qty 6, 60d supply, fill #1
  Filled 2022-09-15: qty 6, 60d supply, fill #2

## 2022-05-15 NOTE — Telephone Encounter (Signed)
[facility] Patient Advocate Encounter  Prior Authorization for [name] has been approved.    PA# [id number] Effective dates: 05/12/22 through 05/12/23  Patients co-pay is $0.00.   Prescription can be filled at [facility].  [facility] will continue to follow.  [name], [facility] Patient [facility] for Infectious Disease Phone: [phone] Fax:  [phone]

## 2022-05-16 ENCOUNTER — Other Ambulatory Visit (HOSPITAL_COMMUNITY): Payer: Self-pay

## 2022-05-16 ENCOUNTER — Other Ambulatory Visit: Payer: Self-pay

## 2022-05-29 ENCOUNTER — Other Ambulatory Visit: Payer: Self-pay

## 2022-05-29 ENCOUNTER — Ambulatory Visit (INDEPENDENT_AMBULATORY_CARE_PROVIDER_SITE_OTHER): Payer: Commercial Managed Care - PPO | Admitting: Pharmacist

## 2022-05-29 DIAGNOSIS — B20 Human immunodeficiency virus [HIV] disease: Secondary | ICD-10-CM | POA: Diagnosis not present

## 2022-05-29 MED ORDER — CABOTEGRAVIR & RILPIVIRINE ER 600 & 900 MG/3ML IM SUER
1.0000 | Freq: Once | INTRAMUSCULAR | Status: AC
  Administered 2022-05-29: 1 via INTRAMUSCULAR

## 2022-05-29 NOTE — Progress Notes (Signed)
HPI: Curtis Trevino is a 57 y.o. male who presents to the [place] clinic for [place] administration.  Patient Active Problem List   Diagnosis Date Noted   Diarrhea 03/29/2022   Routine screening for STI (sexually transmitted infection) 11/29/2021   COVID 04/26/2021   Migraine 09/01/2020   Therapeutic drug monitoring 07/20/2020   Rectal bleeding 07/20/2020   HIV disease ([place]) 02/16/2020   Healthcare maintenance 02/16/2020   Immunization counseling 02/16/2020   Syphilis 02/16/2020   High risk sexual behavior 02/16/2020   Polyarthralgia 04/23/2019   Prediabetes 04/23/2019   Chronic pain of both knees 03/24/2019   Bilateral hand pain 03/24/2019   Pain of great toe, right 03/24/2019    Patient's Medications  New Prescriptions   No medications on file  Previous Medications   ASPIRIN-ACETAMINOPHEN-CAFFEINE (EXCEDRIN MIGRAINE) 250-250-65 MG TABLET    Take by mouth every 6 (six) hours as needed for headache.   CABOTEGRAVIR & RILPIVIRINE ER (CABENUVA) 600 & 900 MG/3ML INJECTION    Inject 1 kit into the muscle every 2 (two) months.   DICLOFENAC SODIUM (VOLTAREN) 1 % GEL    Apply 4 g topically 4 (four) times daily.   DULOXETINE (CYMBALTA) 20 MG CAPSULE    Take 1 capsule (20 mg total) by mouth daily.   TOPIRAMATE (TOPAMAX) 25 MG TABLET    Take 1 tablet by mouth twice daily  Modified Medications   No medications on file  Discontinued Medications   No medications on file    Allergies: Allergies  Allergen Reactions   Codeine Hives, Itching and Rash   Tylenol [Acetaminophen] Nausea And Vomiting    Vomiting every time he takes tylenol    Past Medical History: Past Medical History:  Diagnosis Date   Acid reflux    Chronic back pain     Social History: Social History   Socioeconomic History   Marital status: Single    Spouse name: Not on file   Number of children: Not on file   Years of education: Not on file   Highest education level: Not on file  Occupational  History   Not on file  Tobacco Use   Smoking status: Former    Types: Cigarettes    Quit date: 11/15/2010    Years since quitting: 11.5   Smokeless tobacco: Never  Substance and Sexual Activity   Alcohol use: Not Currently    Alcohol/week: 3.0 standard drinks of alcohol    Types: 3 Shots of liquor per week    Comment: 3 drink a day   Drug use: No   Sexual activity: Yes    Partners: Male    Comment: declined condoms  Other Topics Concern   Not on file  Social History Narrative   Not on file   Social Determinants of Health   Financial Resource Strain: Not on file  Food Insecurity: Not on file  Transportation Needs: Not on file  Physical Activity: Not on file  Stress: Not on file  Social Connections: Not on file    Labs: Lab Results  Component Value Date   HIV1RNAQUANT <20 (H) 03/29/2022   HIV1RNAQUANT Not Detected 11/29/2021   HIV1RNAQUANT NOT DETECTED 08/02/2021   CD4TABS 861 03/29/2022   CD4TABS 849 11/29/2021   CD4TABS 839 12/14/2020    RPR and STI Lab Results  Component Value Date   LABRPR REACTIVE (A) 03/29/2022   LABRPR REACTIVE (A) 11/29/2021   LABRPR REACTIVE (A) 08/02/2021   LABRPR REACTIVE (A) 10/27/2020  LABRPR REACTIVE (A) 07/20/2020   RPRTITER 1:2 (H) 03/29/2022   RPRTITER 1:1 (H) 11/29/2021   RPRTITER 1:2 (H) 08/02/2021   RPRTITER 1:2 (H) 10/27/2020   RPRTITER 1:2 (H) 07/20/2020    STI Results GC CT  08/02/2021  3:30 PM Negative  Negative   10/27/2020  9:07 AM Negative    Negative    Negative  Negative    Positive    Negative   06/08/2020  3:29 PM Negative    Negative    Negative  Negative    Negative    Negative   02/16/2020  9:51 AM Negative  Negative   02/16/2020  9:20 AM Negative  Negative   01/30/2020  9:34 AM Negative  Negative   06/18/2019 10:32 AM Positive    Positive    Negative  Negative    Negative    Negative   06/13/2018 12:00 AM Negative    Negative    Negative  Negative    Negative    Negative    03/18/2018 12:00 AM **POSITIVE**    Negative    Negative  **POSITIVE**    Negative    Negative   09/18/2017 12:00 AM Negative    Negative    Negative  Negative    Negative    Negative   08/23/2017 12:00 AM **POSITIVE**    Negative    Negative  Negative    Negative    Negative     Hepatitis B Lab Results  Component Value Date   HEPBSAB REACTIVE (A) 07/20/2020   HEPBSAG NON-REACTIVE 07/20/2020   HEPBCAB NON-REACTIVE 07/20/2020   Hepatitis C Lab Results  Component Value Date   HEPCAB NON-REACTIVE 01/30/2020   Hepatitis A Lab Results  Component Value Date   HAV REACTIVE (A) 01/30/2020   Lipids: Lab Results  Component Value Date   CHOL 191 01/30/2020   TRIG 193 (H) 01/30/2020   HDL 42 01/30/2020   CHOLHDL 4.5 01/30/2020   LDLCALC 118 (H) 01/30/2020    TARGET DATE: The 23rd  Assessment: Curtis Trevino presents today for his maintenance Cabenuva injections. Past injections were tolerated well without issues. Right side continues to hurt for a few days post injection. No concerns for any STIs today and politely declines testing.   Administered cabotegravir 660m/3mL in left upper outer quadrant of the gluteal muscle. Administered rilpivirine 900 mg/345min the right upper outer quadrant of the gluteal muscle. No issues with injections. He will follow up in 2 months for next set of injections.  Plan: - Cabenuva injections administered - Next injections scheduled for 07/27/22 - Call with any issues or questions  Drusilla Wampole L. Jacie Tristan, PharmD, BCIDP, AAHIVP, CPBear Lakelinical Pharmacist Practitioner [place] [facility]

## 2022-06-21 ENCOUNTER — Ambulatory Visit (INDEPENDENT_AMBULATORY_CARE_PROVIDER_SITE_OTHER): Payer: Commercial Managed Care - PPO | Admitting: Family Medicine

## 2022-06-21 ENCOUNTER — Other Ambulatory Visit (HOSPITAL_COMMUNITY): Payer: Self-pay

## 2022-06-21 ENCOUNTER — Encounter: Payer: Self-pay | Admitting: Family Medicine

## 2022-06-21 ENCOUNTER — Other Ambulatory Visit: Payer: Self-pay

## 2022-06-21 VITALS — BP 124/89 | HR 82 | Wt 188.4 lb

## 2022-06-21 DIAGNOSIS — B349 Viral infection, unspecified: Secondary | ICD-10-CM | POA: Diagnosis not present

## 2022-06-21 MED ORDER — FLUTICASONE PROPIONATE 50 MCG/ACT NA SUSP
2.0000 | Freq: Every day | NASAL | 6 refills | Status: AC
  Filled 2022-06-21: qty 16, 30d supply, fill #0

## 2022-06-21 NOTE — Patient Instructions (Addendum)
Good to see you today - Thank you for coming in  Things we discussed today:  I think you have a viral syndrome and some allergy symptoms - Restart your allergy medications - Plenty of fluids - Usual headache  treatment  Let us know if any recurrence of fever or rash or feeling worse or not better in 3-4 days and completely well in a week

## 2022-06-21 NOTE — Progress Notes (Signed)
    SUBJECTIVE:   CHIEF COMPLAINT / HPI:   Virus His 57 yo was sick with a cold then on Sunday he began with nausea and vomiting and fever and headache.  The headache is primarily R side of his neck.  Not exactly like his usual migraines.  May have strained it vomiting.  Has been taking excedrin.  Today feels better with no vomiting or fever but is sore diffusely in his chest and still have R side neck pain. No rash or visual changes or stiff neck or shortness of breath or sputum.  He believes some of his symptoms could be his seasonal allergies starting. Took a home covid test was negative  PERTINENT  PMH / PSH: HIV, CABENUVA, tompmax  Sees ID regularly - reports his counts are good Fully vaccinated  OBJECTIVE:   BP 124/89   Pulse 82   Wt 188 lb 6.4 oz (85.5 kg)   SpO2 99%   BMI 28.65 kg/m   Awake alert interactive Neck:  No deformities, thyromegaly, masses, or tenderness noted.   Supple with full range of motion with pain.but not palpable tenderness  Throat: normal mucosa, no exudate, uvula midline, no redness Lungs:  Normal respiratory effort, chest expands symmetrically. Lungs are clear to auscultation, no crackles or wheezes. Heart - Regular rate and rhythm.  No murmurs, gallops or rubs.    Skin:  Intact without suspicious lesions or rashes Able to touch toes without symptoms  PERRL EOMI No sinus tenderness   ASSESSMENT/PLAN:   There are no diagnoses linked to this encounter.  Viral Syndrome - given exposure and that he is improving and his HIV is well controlled and he is fully vaccinated and no signs of meningitis or sinusitis or pneumonia do not feel further testing is indicated.  Treat symptomatically and monitor    Patient Instructions  Good to see you today - Thank you for coming in  Things we discussed today:  I think you have a viral syndrome and some allergy symptoms - Restart your allergy medications - Plenty of fluids - Usual headache  treatment  Let us  know if any recurrence of fever or rash or feeling worse or not better in 3-4 days and completely well in a week   Lind Covert, [place]

## 2022-06-30 ENCOUNTER — Other Ambulatory Visit (HOSPITAL_COMMUNITY): Payer: Self-pay

## 2022-07-03 ENCOUNTER — Other Ambulatory Visit (HOSPITAL_COMMUNITY): Payer: Self-pay

## 2022-07-14 ENCOUNTER — Other Ambulatory Visit (HOSPITAL_COMMUNITY): Payer: Self-pay

## 2022-07-17 ENCOUNTER — Other Ambulatory Visit (HOSPITAL_COMMUNITY): Payer: Self-pay

## 2022-07-17 ENCOUNTER — Telehealth: Payer: Self-pay

## 2022-07-17 NOTE — Telephone Encounter (Signed)
[facility] Patient Advocate Encounter  Patient's medication Renaldo Harrison) have been couriered to [facility] from [facility] and will be administered on the patient next office visit on 07/27/22.  Clearance [name] , CPhT Specialty Pharmacy Patient [facility] for Infectious Disease Phone: [phone] Fax:  [phone]

## 2022-07-27 ENCOUNTER — Other Ambulatory Visit: Payer: Self-pay

## 2022-07-27 ENCOUNTER — Ambulatory Visit (INDEPENDENT_AMBULATORY_CARE_PROVIDER_SITE_OTHER): Payer: Commercial Managed Care - PPO | Admitting: Pharmacist

## 2022-07-27 DIAGNOSIS — B20 Human immunodeficiency virus [HIV] disease: Secondary | ICD-10-CM | POA: Diagnosis not present

## 2022-07-27 MED ORDER — CABOTEGRAVIR & RILPIVIRINE ER 600 & 900 MG/3ML IM SUER
1.0000 | Freq: Once | INTRAMUSCULAR | Status: AC
  Administered 2022-07-27: 1 via INTRAMUSCULAR

## 2022-07-27 NOTE — Progress Notes (Signed)
HPI: Curtis Trevino is a 57 y.o. male who presents to the [facility] pharmacy clinic for [place] administration.  Patient Active Problem List   Diagnosis Date Noted   Diarrhea 03/29/2022   Routine screening for STI (sexually transmitted infection) 11/29/2021   COVID 04/26/2021   Migraine 09/01/2020   Therapeutic drug monitoring 07/20/2020   Rectal bleeding 07/20/2020   HIV disease 02/16/2020   Healthcare maintenance 02/16/2020   Immunization counseling 02/16/2020   Syphilis 02/16/2020   High risk sexual behavior 02/16/2020   Polyarthralgia 04/23/2019   Prediabetes 04/23/2019   Chronic pain of both knees 03/24/2019   Bilateral hand pain 03/24/2019   Pain of great toe, right 03/24/2019    Patient's Medications  New Prescriptions   No medications on file  Previous Medications   ASPIRIN-ACETAMINOPHEN-CAFFEINE (EXCEDRIN MIGRAINE) 250-250-65 MG TABLET    Take by mouth every 6 (six) hours as needed for headache.   CABOTEGRAVIR & RILPIVIRINE ER (CABENUVA) 600 & 900 MG/3ML INJECTION    Inject 1 kit into the muscle every 2 (two) months.   DICLOFENAC SODIUM (VOLTAREN) 1 % GEL    Apply 4 g topically 4 (four) times daily.   DULOXETINE (CYMBALTA) 20 MG CAPSULE    Take 1 capsule (20 mg total) by mouth daily.   FLUTICASONE (FLONASE) 50 MCG/ACT NASAL SPRAY    Place 2 sprays into both nostrils daily.   TOPIRAMATE (TOPAMAX) 25 MG TABLET    Take 1 tablet by mouth twice daily  Modified Medications   No medications on file  Discontinued Medications   No medications on file    Allergies: Allergies  Allergen Reactions   Codeine Hives, Itching and Rash   Tylenol [Acetaminophen] Nausea And Vomiting    Vomiting every time he takes tylenol    Past Medical History: Past Medical History:  Diagnosis Date   Acid reflux    Chronic back pain     Social History: Social History   Socioeconomic History   Marital status: Single    Spouse name: Not on file   Number of children: Not on file    Years of education: Not on file   Highest education level: Not on file  Occupational History   Not on file  Tobacco Use   Smoking status: Former    Types: Cigarettes    Quit date: 11/15/2010    Years since quitting: 11.7   Smokeless tobacco: Never  Substance and Sexual Activity   Alcohol use: Not Currently    Alcohol/week: 3.0 standard drinks of alcohol    Types: 3 Shots of liquor per week    Comment: 3 drink a day   Drug use: No   Sexual activity: Yes    Partners: Male    Comment: declined condoms  Other Topics Concern   Not on file  Social History Narrative   Not on file   Social Determinants of Health   Financial Resource Strain: Not on file  Food Insecurity: Not on file  Transportation Needs: Not on file  Physical Activity: Not on file  Stress: Not on file  Social Connections: Not on file    Labs: Lab Results  Component Value Date   HIV1RNAQUANT <20 (H) 03/29/2022   HIV1RNAQUANT Not Detected 11/29/2021   HIV1RNAQUANT NOT DETECTED 08/02/2021   CD4TABS 861 03/29/2022   CD4TABS 849 11/29/2021   CD4TABS 839 12/14/2020    RPR and STI Lab Results  Component Value Date   LABRPR REACTIVE (A) 03/29/2022   LABRPR  REACTIVE (A) 11/29/2021   LABRPR REACTIVE (A) 08/02/2021   LABRPR REACTIVE (A) 10/27/2020   LABRPR REACTIVE (A) 07/20/2020   RPRTITER 1:2 (H) 03/29/2022   RPRTITER 1:1 (H) 11/29/2021   RPRTITER 1:2 (H) 08/02/2021   RPRTITER 1:2 (H) 10/27/2020   RPRTITER 1:2 (H) 07/20/2020    STI Results GC CT  08/02/2021  3:30 PM Negative  Negative   10/27/2020  9:07 AM Negative    Negative    Negative  Negative    Positive    Negative   06/08/2020  3:29 PM Negative    Negative    Negative  Negative    Negative    Negative   02/16/2020  9:51 AM Negative  Negative   02/16/2020  9:20 AM Negative  Negative   01/30/2020  9:34 AM Negative  Negative   06/18/2019 10:32 AM Positive    Positive    Negative  Negative    Negative    Negative   06/13/2018 12:00 AM  Negative    Negative    Negative  Negative    Negative    Negative   03/18/2018 12:00 AM **POSITIVE**    Negative    Negative  **POSITIVE**    Negative    Negative   09/18/2017 12:00 AM Negative    Negative    Negative  Negative    Negative    Negative   08/23/2017 12:00 AM **POSITIVE**    Negative    Negative  Negative    Negative    Negative     Hepatitis B Lab Results  Component Value Date   HEPBSAB REACTIVE (A) 07/20/2020   HEPBSAG NON-REACTIVE 07/20/2020   HEPBCAB NON-REACTIVE 07/20/2020   Hepatitis C Lab Results  Component Value Date   HEPCAB NON-REACTIVE 01/30/2020   Hepatitis A Lab Results  Component Value Date   HAV REACTIVE (A) 01/30/2020   Lipids: Lab Results  Component Value Date   CHOL 191 01/30/2020   TRIG 193 (H) 01/30/2020   HDL 42 01/30/2020   CHOLHDL 4.5 01/30/2020   LDLCALC 118 (H) 01/30/2020    TARGET DATE: The 23rd   Assessment: Luther presents today for his maintenance Cabenuva injections. Past injections were tolerated well without issues. He has been experiencing back pain and is unsure if he pulled a muscle. We encouraged him to keep [place] updated on his pain and counseled that movement after the injection appointment can help with injection site soreness.   Administered cabotegravir 600mg /53mL in left upper outer quadrant of the gluteal muscle. Administered rilpivirine 900 mg/50mL in the right upper outer quadrant of the gluteal muscle. No issues with injections. He will follow up in 2 months for next set of injections.  Elven politely declines STI testing and the mpox and covid vaccine today. He would like to keep the appointment short as he is transporting his aunt to [place] for a medical appointment. Will still measure an HIV RNA today as planned.   I reminded him that Dr. Earlene Plater is leaving our clinic. Provider availability to see Christia within his June window was limited as patient requests Thursday appointments. Next ID  provider follow up scheduled with Dr. Luciana Axe in August. He will see Cassie in June.   Plan: - Cabenuva injections administered - Follow up HIV RNA today  - Next injections scheduled for June 20th, 2024 with Cassie  - Follow up with Dr. Luciana Axe on August 22nd, 2024  - Call with any issues or questions  Dahlia Client  Brooke Bonito, PharmD  PGY1 Pharmacy Resident

## 2022-07-30 LAB — HIV-1 RNA QUANT-NO REFLEX-BLD
HIV 1 RNA Quant: NOT DETECTED Copies/mL
HIV-1 RNA Quant, Log: NOT DETECTED Log cps/mL

## 2022-08-28 ENCOUNTER — Other Ambulatory Visit (HOSPITAL_COMMUNITY): Payer: Self-pay

## 2022-09-15 ENCOUNTER — Other Ambulatory Visit (HOSPITAL_COMMUNITY): Payer: Self-pay

## 2022-09-19 ENCOUNTER — Encounter: Payer: Self-pay | Admitting: Pharmacist

## 2022-09-20 ENCOUNTER — Other Ambulatory Visit (HOSPITAL_COMMUNITY): Payer: Self-pay

## 2022-09-21 ENCOUNTER — Other Ambulatory Visit: Payer: Self-pay | Admitting: Pharmacist

## 2022-09-21 ENCOUNTER — Other Ambulatory Visit: Payer: Self-pay

## 2022-09-21 DIAGNOSIS — B20 Human immunodeficiency virus [HIV] disease: Secondary | ICD-10-CM

## 2022-09-21 MED ORDER — CABOTEGRAVIR & RILPIVIRINE ER 600 & 900 MG/3ML IM SUER: 1.0000 | INTRAMUSCULAR | 5 refills | Status: AC

## 2022-09-21 NOTE — Progress Notes (Signed)
The 10-year ASCVD risk score (Arnett DK, et al., 2019) is: 4.6%   Values used to calculate the score:     Age: 57 years     Sex: Male     Is Non-Hispanic African American: No     Diabetic: No     Tobacco smoker: No     Systolic Blood Pressure: 124 mmHg     Is BP treated: No     HDL Cholesterol: 51 MG/DL     Total Cholesterol: 180 MG/DL  Sandie Ano, RN

## 2022-09-26 ENCOUNTER — Telehealth: Payer: Self-pay

## 2022-09-26 NOTE — Telephone Encounter (Signed)
[facility] Patient Advocate Encounter  Patient's medication (Cabenuva) have been couriered to [facility] from [facility] and will be administered on the patient next office visit on 09/28/22.  Dakia Schifano , CPhT Specialty Pharmacy Patient [facility] Phone: [phone] Fax:  [phone]  

## 2022-09-28 ENCOUNTER — Other Ambulatory Visit: Payer: Self-pay

## 2022-09-28 ENCOUNTER — Ambulatory Visit (INDEPENDENT_AMBULATORY_CARE_PROVIDER_SITE_OTHER): Payer: Self-pay | Admitting: Pharmacist

## 2022-09-28 DIAGNOSIS — B20 Human immunodeficiency virus [HIV] disease: Secondary | ICD-10-CM

## 2022-09-28 MED ORDER — CABOTEGRAVIR & RILPIVIRINE ER 600 & 900 MG/3ML IM SUER
1.0000 | Freq: Once | INTRAMUSCULAR | Status: AC
  Administered 2022-09-28: 1 via INTRAMUSCULAR

## 2022-09-28 NOTE — Progress Notes (Signed)
HPI: Curtis Trevino is a 56 y.o. male who presents to the [facility] pharmacy clinic for [place] administration.  Patient Active Problem List   Diagnosis Date Noted   Diarrhea 03/29/2022   Routine screening for STI (sexually transmitted infection) 11/29/2021   COVID 04/26/2021   Migraine 09/01/2020   Therapeutic drug monitoring 07/20/2020   Rectal bleeding 07/20/2020   HIV disease (HCC) 02/16/2020   Healthcare maintenance 02/16/2020   Immunization counseling 02/16/2020   Syphilis 02/16/2020   High risk sexual behavior 02/16/2020   Polyarthralgia 04/23/2019   Prediabetes 04/23/2019   Chronic pain of both knees 03/24/2019   Bilateral hand pain 03/24/2019   Pain of great toe, right 03/24/2019    Patient's Medications  New Prescriptions   No medications on file  Previous Medications   ASPIRIN-ACETAMINOPHEN-CAFFEINE (EXCEDRIN MIGRAINE) 250-250-65 MG TABLET    Take by mouth every 6 (six) hours as needed for headache.   CABOTEGRAVIR & RILPIVIRINE ER (CABENUVA) 600 & 900 MG/3ML INJECTION    Inject 1 kit into the muscle every 2 (two) months.   DICLOFENAC SODIUM (VOLTAREN) 1 % GEL    Apply 4 g topically 4 (four) times daily.   DULOXETINE (CYMBALTA) 20 MG CAPSULE    Take 1 capsule (20 mg total) by mouth daily.   FLUTICASONE (FLONASE) 50 MCG/ACT NASAL SPRAY    Place 2 sprays into both nostrils daily.   TOPIRAMATE (TOPAMAX) 25 MG TABLET    Take 1 tablet by mouth twice daily  Modified Medications   No medications on file  Discontinued Medications   No medications on file    Allergies: Allergies  Allergen Reactions   Codeine Hives, Itching and Rash   Tylenol [Acetaminophen] Nausea And Vomiting    Vomiting every time he takes tylenol    Past Medical History: Past Medical History:  Diagnosis Date   Acid reflux    Chronic back pain     Social History: Social History   Socioeconomic History   Marital status: Single    Spouse name: Not on file   Number of children: Not on file    Years of education: Not on file   Highest education level: Not on file  Occupational History   Not on file  Tobacco Use   Smoking status: Former    Types: Cigarettes    Quit date: 11/15/2010    Years since quitting: 11.8   Smokeless tobacco: Never  Substance and Sexual Activity   Alcohol use: Not Currently    Alcohol/week: 3.0 standard drinks of alcohol    Types: 3 Shots of liquor per week    Comment: 3 drink a day   Drug use: No   Sexual activity: Yes    Partners: Male    Comment: declined condoms  Other Topics Concern   Not on file  Social History Narrative   Not on file   Social Determinants of Health   Financial Resource Strain: Not on file  Food Insecurity: Not on file  Transportation Needs: Not on file  Physical Activity: Not on file  Stress: Not on file  Social Connections: Not on file    Labs: Lab Results  Component Value Date   HIV1RNAQUANT Not Detected 07/27/2022   HIV1RNAQUANT <20 (H) 03/29/2022   HIV1RNAQUANT Not Detected 11/29/2021   CD4TABS 861 03/29/2022   CD4TABS 849 11/29/2021   CD4TABS 839 12/14/2020    RPR and STI Lab Results  Component Value Date   LABRPR REACTIVE (A) 03/29/2022  LABRPR REACTIVE (A) 11/29/2021   LABRPR REACTIVE (A) 08/02/2021   LABRPR REACTIVE (A) 10/27/2020   LABRPR REACTIVE (A) 07/20/2020   RPRTITER 1:2 (H) 03/29/2022   RPRTITER 1:1 (H) 11/29/2021   RPRTITER 1:2 (H) 08/02/2021   RPRTITER 1:2 (H) 10/27/2020   RPRTITER 1:2 (H) 07/20/2020    STI Results GC CT  08/02/2021  3:30 PM Negative  Negative   10/27/2020  9:07 AM Negative    Negative    Negative  Negative    Positive    Negative   06/08/2020  3:29 PM Negative    Negative    Negative  Negative    Negative    Negative   02/16/2020  9:51 AM Negative  Negative   02/16/2020  9:20 AM Negative  Negative   01/30/2020  9:34 AM Negative  Negative   06/18/2019 10:32 AM Positive    Positive    Negative  Negative    Negative    Negative   06/13/2018 12:00  AM Negative    Negative    Negative  Negative    Negative    Negative   03/18/2018 12:00 AM **POSITIVE**    Negative    Negative  **POSITIVE**    Negative    Negative   09/18/2017 12:00 AM Negative    Negative    Negative  Negative    Negative    Negative   08/23/2017 12:00 AM **POSITIVE**    Negative    Negative  Negative    Negative    Negative     Hepatitis B Lab Results  Component Value Date   HEPBSAB REACTIVE (A) 07/20/2020   HEPBSAG NON-REACTIVE 07/20/2020   HEPBCAB NON-REACTIVE 07/20/2020   Hepatitis C Lab Results  Component Value Date   HEPCAB NON-REACTIVE 01/30/2020   Hepatitis A Lab Results  Component Value Date   HAV REACTIVE (A) 01/30/2020   Lipids: Lab Results  Component Value Date   CHOL 191 01/30/2020   TRIG 193 (H) 01/30/2020   HDL 42 01/30/2020   CHOLHDL 4.5 01/30/2020   LDLCALC 118 (H) 01/30/2020    TARGET DATE: 23rd  Assessment: Curtis Trevino presents today for his maintenance Cabenuva injections. Past injections were tolerated well without issues except for mild soreness. Last HIV RNA was undetectable in April. It has been over a year since we have checked cytologies but he is requesting to hold off until he sees provider in August. Dr. Earlene Plater, his normal provider, has left so will get him set up with Dr. Luciana Axe.   Administered cabotegravir 600mg /63mL in left upper outer quadrant of the gluteal muscle. Administered rilpivirine 900 mg/26mL in the right upper outer quadrant of the gluteal muscle. No issues with injections. He will follow up in 2 months for next set of injections.  Plan: - Cabenuva injections administered - Next injections scheduled for 12/07/22 (he is aware this is near the end of his injection window) and 01/25/23 with me - Call with any issues or questions  Azlynn Mitnick L. Shiann Kam, PharmD, BCIDP, AAHIVP, CPP Clinical Pharmacist Practitioner Infectious Diseases Clinical Pharmacist [facility]

## 2022-10-31 ENCOUNTER — Other Ambulatory Visit (HOSPITAL_COMMUNITY): Payer: Self-pay

## 2022-11-07 ENCOUNTER — Other Ambulatory Visit (HOSPITAL_COMMUNITY): Payer: Self-pay

## 2022-11-30 ENCOUNTER — Telehealth: Payer: Self-pay

## 2022-11-30 ENCOUNTER — Ambulatory Visit: Payer: Commercial Managed Care - PPO | Admitting: Internal Medicine

## 2022-11-30 NOTE — Telephone Encounter (Signed)
[facility] Patient Advocate Encounter  Patient's medication Curtis Trevino) have been couriered to [facility] from [employer] and will be administered on the patient next office visit on 12/07/22.  Clearance [name] , CPhT Specialty Pharmacy Patient [facility] for Infectious Disease Phone: [phone] Fax:  [phone]

## 2022-12-07 ENCOUNTER — Encounter: Payer: Self-pay | Admitting: Internal Medicine

## 2022-12-07 ENCOUNTER — Other Ambulatory Visit (HOSPITAL_COMMUNITY)
Admission: RE | Admit: 2022-12-07 | Discharge: 2022-12-07 | Disposition: A | Discharge status: Home / Self Care | Payer: No Typology Code available for payment source | Source: Ambulatory Visit | Attending: Internal Medicine | Admitting: Internal Medicine

## 2022-12-07 ENCOUNTER — Ambulatory Visit (INDEPENDENT_AMBULATORY_CARE_PROVIDER_SITE_OTHER): Payer: No Typology Code available for payment source | Admitting: Internal Medicine

## 2022-12-07 ENCOUNTER — Telehealth: Payer: Self-pay

## 2022-12-07 ENCOUNTER — Other Ambulatory Visit: Payer: Self-pay

## 2022-12-07 ENCOUNTER — Other Ambulatory Visit (HOSPITAL_COMMUNITY): Payer: Self-pay

## 2022-12-07 VITALS — BP 122/85 | HR 76 | Temp 98.4°F | Ht 68.5 in | Wt 188.0 lb

## 2022-12-07 DIAGNOSIS — Z113 Encounter for screening for infections with a predominantly sexual mode of transmission: Secondary | ICD-10-CM

## 2022-12-07 DIAGNOSIS — B20 Human immunodeficiency virus [HIV] disease: Secondary | ICD-10-CM

## 2022-12-07 DIAGNOSIS — Z79899 Other long term (current) drug therapy: Secondary | ICD-10-CM | POA: Diagnosis not present

## 2022-12-07 MED ORDER — ROSUVASTATIN CALCIUM 10 MG PO TABS: 10.0000 mg | ORAL_TABLET | Freq: Every day | ORAL | 11 refills | Status: DC

## 2022-12-07 MED ORDER — CABOTEGRAVIR & RILPIVIRINE ER 600 & 900 MG/3ML IM SUER
1.0000 | Freq: Once | INTRAMUSCULAR | Status: AC
Start: 2022-12-07 — End: 2022-12-07
  Administered 2022-12-07: 1 via INTRAMUSCULAR

## 2022-12-07 NOTE — Progress Notes (Signed)
   Subjective:    Patient ID: Curtis Trevino, male    DOB: 1965-07-14, 57 y.o.   MRN: [id number]  HPI Curtis Trevino is here for follow up of HIV He continues on [place] with no concerns.  Has been on it about 1 year.  No issues with tolerating it.  Has gained some weight and eating better now and losing.    Review of Systems  Constitutional:  Negative for fatigue.  Gastrointestinal:  Negative for diarrhea and nausea.  Skin:  Negative for rash.       Objective:   Physical Exam Eyes:     General: No scleral icterus. Pulmonary:     Effort: Pulmonary effort is normal.  Neurological:     Mental Status: He is alert.           Assessment & Plan:

## 2022-12-07 NOTE — Telephone Encounter (Signed)
[facility] Patient Advocate Encounter   Received notification from [employer] that prior authorization for Curtis Trevino is required.   PA submitted on 12/07/22 Key [id number] Status is pending    [facility] Clinic will continue to follow.   Clearance [name], CPhT Specialty Pharmacy Patient [facility] for Infectious Disease Phone: [phone] Fax:  [phone]

## 2022-12-07 NOTE — Addendum Note (Signed)
Addended by: Philippa Chester on: 12/07/2022 10:38 AM   Modules accepted: Orders

## 2022-12-07 NOTE — Assessment & Plan Note (Signed)
Will screen Discussed anal pap recommendations and he has declined at this time

## 2022-12-07 NOTE — Assessment & Plan Note (Signed)
With new research coming out by way of the REPRIEVE study regarding cardiovascular event risk reduction for PLWH, I discussed that over the 8 year trial initiation of statin (pitavastatin) therapy was shown to reduce CV disease by 35% independent of other risk factors.   The 10-year ASCVD risk score (Arnett DK, et al., 2019) is: 4.5%   Values used to calculate the score:     Age: 57 years     Sex: Male     Is Non-Hispanic African American: No     Diabetic: No     Tobacco smoker: No     Systolic Blood Pressure: 122 mmHg     Is BP treated: No     HDL Cholesterol: 51 MG/DL     Total Cholesterol: 180 MG/DL  We discussed the patient's 10-year CVD Risk Score to be at least moderately elevated, and would benefit from intervention given HIV+ and > 75 yo.   Will proceed with lower-intensity statin given non-diabetic.

## 2022-12-07 NOTE — Assessment & Plan Note (Addendum)
He continues to do well, no concerns and [place] given today.  Labs today Follow up with pharmacy already scheduled  Wants to get his flu shot and CoVID at the same time next month  I have personally spent 45 minutes involved in face-to-face and non-face-to-face activities for this patient on the day of the visit. Professional time spent includes the following activities: Preparing to see the patient (review of tests), Obtaining and/or reviewing separately obtained history (admission/discharge record), Performing a medically appropriate examination and/or evaluation , Ordering medications/tests/procedures, referring and communicating with other health care professionals, Documenting clinical information in the EMR, Independently interpreting results (not separately reported), Communicating results to the patient/family/caregiver, Counseling and educating the patient/family/caregiver and Care coordination (not separately reported).

## 2022-12-08 LAB — T-HELPER CELL (CD4) - (RCID CLINIC ONLY)
CD4 % Helper T Cell: 43 % (ref 33–65)
CD4 T Cell Abs: 750 /uL (ref 400–1790)

## 2022-12-12 ENCOUNTER — Telehealth: Payer: Self-pay

## 2022-12-12 NOTE — Telephone Encounter (Signed)
Pharmacy Patient Advocate Encounter- Renaldo Harrison BIV-Medical Benefit:  J code: [id number]  CPT code: 52841  Dx Code: B20  NO PA is required through [employer] ([employer]

## 2022-12-13 LAB — CBC WITH DIFFERENTIAL/PLATELET
Absolute Monocytes: 491 {cells}/uL (ref 200–950)
Basophils Absolute: 101 {cells}/uL (ref 0–200)
Basophils Relative: 1.6 %
Eosinophils Absolute: 82 {cells}/uL (ref 15–500)
Eosinophils Relative: 1.3 %
HCT: 42.9 % (ref 38.5–50.0)
Hemoglobin: 14.3 g/dL (ref 13.2–17.1)
Lymphs Abs: 1827 {cells}/uL (ref 850–3900)
MCH: 28.3 pg (ref 27.0–33.0)
MCHC: 33.3 g/dL (ref 32.0–36.0)
MCV: 85 fL (ref 80.0–100.0)
MPV: 11.3 fL (ref 7.5–12.5)
Monocytes Relative: 7.8 %
Neutro Abs: 3799 {cells}/uL (ref 1500–7800)
Neutrophils Relative %: 60.3 %
Platelets: 241 10*3/uL (ref 140–400)
RBC: 5.05 10*6/uL (ref 4.20–5.80)
RDW: 14.3 % (ref 11.0–15.0)
Total Lymphocyte: 29 %
WBC: 6.3 10*3/uL (ref 3.8–10.8)

## 2022-12-13 LAB — COMPLETE METABOLIC PANEL WITH GFR
AG Ratio: 1.4 (calc) (ref 1.0–2.5)
ALT: 21 U/L (ref 9–46)
AST: 28 U/L (ref 10–35)
Albumin: 4.3 g/dL (ref 3.6–5.1)
Alkaline phosphatase (APISO): 39 U/L (ref 35–144)
BUN: 16 mg/dL (ref 7–25)
CO2: 24 mmol/L (ref 20–32)
Calcium: 9.8 mg/dL (ref 8.6–10.3)
Chloride: 108 mmol/L (ref 98–110)
Creat: 1.3 mg/dL (ref 0.70–1.30)
Globulin: 3 g/dL (ref 1.9–3.7)
Glucose, Bld: 109 mg/dL — ABNORMAL HIGH (ref 65–99)
Potassium: 4 mmol/L (ref 3.5–5.3)
Sodium: 140 mmol/L (ref 135–146)
Total Bilirubin: 0.3 mg/dL (ref 0.2–1.2)
Total Protein: 7.3 g/dL (ref 6.1–8.1)
eGFR: 65 mL/min/{1.73_m2} (ref >=60)

## 2022-12-13 LAB — T PALLIDUM AB: T Pallidum Abs: POSITIVE — AB

## 2022-12-13 LAB — HIV-1 RNA QUANT-NO REFLEX-BLD
HIV 1 RNA Quant: NOT DETECTED {copies}/mL
HIV-1 RNA Quant, Log: NOT DETECTED {Log_copies}/mL

## 2022-12-13 LAB — RPR: RPR Ser Ql: REACTIVE — AB

## 2022-12-13 LAB — RPR TITER: RPR Titer: 1:2 {titer} — ABNORMAL HIGH

## 2022-12-13 LAB — LIPID PANEL
Cholesterol: 167 mg/dL (ref <200)
HDL: 49 mg/dL (ref >=40)
LDL Cholesterol (Calc): 92 mg/dL
Non-HDL Cholesterol (Calc): 118 mg/dL (ref <130)
Total CHOL/HDL Ratio: 3.4 (calc) (ref <5.0)
Triglycerides: 161 mg/dL — ABNORMAL HIGH (ref <150)

## 2022-12-14 LAB — URINE CYTOLOGY ANCILLARY ONLY
Chlamydia: NEGATIVE
Comment: NEGATIVE
Comment: NORMAL
Neisseria Gonorrhea: NEGATIVE

## 2022-12-24 ENCOUNTER — Telehealth: Payer: No Typology Code available for payment source | Admitting: Family Medicine

## 2022-12-24 DIAGNOSIS — J4540 Moderate persistent asthma, uncomplicated: Secondary | ICD-10-CM

## 2022-12-24 MED ORDER — AZITHROMYCIN 250 MG PO TABS: ORAL_TABLET | ORAL | 0 refills | Status: AC

## 2022-12-24 MED ORDER — PREDNISONE 20 MG PO TABS: 20.0000 mg | ORAL_TABLET | Freq: Two times a day (BID) | ORAL | 0 refills | Status: AC

## 2022-12-24 MED ORDER — BENZONATATE 200 MG PO CAPS: 200.0000 mg | ORAL_CAPSULE | Freq: Two times a day (BID) | ORAL | 0 refills | Status: AC | PRN

## 2022-12-24 NOTE — Progress Notes (Signed)
Virtual Visit Consent   [name], you are scheduled for a virtual visit with a [facility] provider today. Just as with appointments in the office, your consent must be obtained to participate. Your consent will be active for this visit and any virtual visit you may have with one of our providers in the next 365 days. If you have a MyChart account, a copy of this consent can be sent to you electronically.  As this is a virtual visit, video technology does not allow for your provider to perform a traditional examination. This may limit your provider's ability to fully assess your condition. If your provider identifies any concerns that need to be evaluated in person or the need to arrange testing (such as labs, EKG, etc.), we will make arrangements to do so. Although advances in technology are sophisticated, we cannot ensure that it will always work on either your end or our end. If the connection with a video visit is poor, the visit may have to be switched to a telephone visit. With either a video or telephone visit, we are not always able to ensure that we have a secure connection.  By engaging in this virtual visit, you consent to the provision of healthcare and authorize for your insurance to be billed (if applicable) for the services provided during this visit. Depending on your insurance coverage, you may receive a charge related to this service.  I need to obtain your verbal consent now. Are you willing to proceed with your visit today? Curtis Trevino has provided verbal consent on 12/24/2022 for a virtual visit (video or telephone). Georgana Curio, FNP  Date: 12/24/2022 10:51 AM  Virtual Visit via Video Note   I, Georgana Curio, connected with  Curtis Trevino  ([id number], 04-13-1965) on 12/24/22 at 10:45 AM EDT by a video-enabled telemedicine application and verified that I am speaking with the correct person using two identifiers.  Location: Patient: Virtual Visit Location Patient:  Home Provider: Virtual Visit Location Provider: Home Office   I discussed the limitations of evaluation and management by telemedicine and the availability of in person appointments. The patient expressed understanding and agreed to proceed.    History of Present Illness: Curtis Trevino is a 57 y.o. who identifies as a male who was assigned male at birth, and is being seen today for head congestion, sinus pressure, drainage, cough, wheezing and sob with tightness from cough. No fever. Sx for 2 weeks. Partner had neg covid test but was sick also. In no distress. Marland Kitchen  HPI: HPI  Problems:  Patient Active Problem List   Diagnosis Date Noted   Encounter for long-term (current) use of high-risk medication 12/07/2022   Diarrhea 03/29/2022   Routine screening for STI (sexually transmitted infection) 11/29/2021   COVID 04/26/2021   Migraine 09/01/2020   Therapeutic drug monitoring 07/20/2020   Rectal bleeding 07/20/2020   HIV disease (HCC) 02/16/2020   Healthcare maintenance 02/16/2020   Immunization counseling 02/16/2020   Syphilis 02/16/2020   High risk sexual behavior 02/16/2020   Polyarthralgia 04/23/2019   Prediabetes 04/23/2019   Chronic pain of both knees 03/24/2019   Bilateral hand pain 03/24/2019   Pain of great toe, right 03/24/2019    Allergies:  Allergies  Allergen Reactions   Codeine Hives, Itching and Rash   Tylenol [Acetaminophen] Nausea And Vomiting    Vomiting every time he takes tylenol   Medications:  Current Outpatient Medications:    aspirin-acetaminophen-caffeine (EXCEDRIN MIGRAINE) 250-250-65 MG  tablet, Take by mouth every 6 (six) hours as needed for headache., Disp: , Rfl:    cabotegravir & rilpivirine ER (CABENUVA) 600 & 900 MG/3ML injection, Inject 1 kit into the muscle every 2 (two) months., Disp: 6 mL, Rfl: 5   diclofenac Sodium (VOLTAREN) 1 % GEL, Apply 4 g topically 4 (four) times daily., Disp: 350 g, Rfl: 3   DULoxetine (CYMBALTA) 20 MG capsule, Take 1  capsule (20 mg total) by mouth daily. (Patient not taking: Reported on 12/07/2022), Disp: 30 capsule, Rfl: 3   fluticasone (FLONASE) 50 MCG/ACT nasal spray, Place 2 sprays into both nostrils daily., Disp: 16 g, Rfl: 6   rosuvastatin (CRESTOR) 10 MG tablet, Take 1 tablet (10 mg total) by mouth daily., Disp: 30 tablet, Rfl: 11   topiramate (TOPAMAX) 25 MG tablet, Take 1 tablet by mouth twice daily, Disp: 60 tablet, Rfl: 2  Observations/Objective: Patient is well-developed, well-nourished in no acute distress.  Resting comfortably  at home.  Head is normocephalic, atraumatic.  No labored breathing.  Speech is clear and coherent with logical content.  Patient is alert and oriented at baseline.    Assessment and Plan: 1. Moderate persistent asthmatic bronchitis without complication  Increase fluids, humidifier at night, tylenol or ibuprofen, UC if sx persist or worsen.   Follow Up Instructions: I discussed the assessment and treatment plan with the patient. The patient was provided an opportunity to ask questions and all were answered. The patient agreed with the plan and demonstrated an understanding of the instructions.  A copy of instructions were sent to the patient via MyChart unless otherwise noted below.     The patient was advised to call back or seek an in-person evaluation if the symptoms worsen or if the condition fails to improve as anticipated.  Time:  I spent 10 minutes with the patient via telehealth technology discussing the above problems/concerns.    Georgana Curio, FNP

## 2022-12-24 NOTE — Patient Instructions (Signed)

## 2023-01-18 ENCOUNTER — Telehealth: Payer: Self-pay

## 2023-01-18 NOTE — Telephone Encounter (Signed)
[facility] Patient Advocate Encounter  Patient's medications (CABENUVA) have been couriered to [facility] from [facility] and will be administered at the patients appointment on 01/25/23.   Kae Heller, CPhT Specialty Pharmacy Patient [facility] for Infectious Disease Phone: [phone] Fax:  [phone]

## 2023-01-24 ENCOUNTER — Encounter: Payer: Self-pay | Admitting: Internal Medicine

## 2023-01-24 NOTE — Progress Notes (Signed)
HPI: Curtis Trevino is a 57 y.o. male who presents to the [facility] pharmacy clinic for [place] administration.  Patient Active Problem List   Diagnosis Date Noted   Encounter for long-term (current) use of high-risk medication 12/07/2022   Diarrhea 03/29/2022   Routine screening for STI (sexually transmitted infection) 11/29/2021   COVID 04/26/2021   Migraine 09/01/2020   Therapeutic drug monitoring 07/20/2020   Rectal bleeding 07/20/2020   HIV disease (HCC) 02/16/2020   Healthcare maintenance 02/16/2020   Immunization counseling 02/16/2020   Syphilis 02/16/2020   High risk sexual behavior 02/16/2020   Polyarthralgia 04/23/2019   Prediabetes 04/23/2019   Chronic pain of both knees 03/24/2019   Bilateral hand pain 03/24/2019   Pain of great toe, right 03/24/2019    Patient's Medications  New Prescriptions   No medications on file  Previous Medications   ASPIRIN-ACETAMINOPHEN-CAFFEINE (EXCEDRIN MIGRAINE) 250-250-65 MG TABLET    Take by mouth every 6 (six) hours as needed for headache.   CABOTEGRAVIR & RILPIVIRINE ER (CABENUVA) 600 & 900 MG/3ML INJECTION    Inject 1 kit into the muscle every 2 (two) months.   DICLOFENAC SODIUM (VOLTAREN) 1 % GEL    Apply 4 g topically 4 (four) times daily.   DULOXETINE (CYMBALTA) 20 MG CAPSULE    Take 1 capsule (20 mg total) by mouth daily.   FLUTICASONE (FLONASE) 50 MCG/ACT NASAL SPRAY    Place 2 sprays into both nostrils daily.   ROSUVASTATIN (CRESTOR) 10 MG TABLET    Take 1 tablet (10 mg total) by mouth daily.   TOPIRAMATE (TOPAMAX) 25 MG TABLET    Take 1 tablet by mouth twice daily  Modified Medications   No medications on file  Discontinued Medications   No medications on file    Allergies: Allergies  Allergen Reactions   Codeine Hives, Itching and Rash   Tylenol [Acetaminophen] Nausea And Vomiting    Vomiting every time he takes tylenol    Labs: Lab Results  Component Value Date   HIV1RNAQUANT Not Detected 12/07/2022    HIV1RNAQUANT Not Detected 07/27/2022   HIV1RNAQUANT <20 (H) 03/29/2022   CD4TABS 750 12/07/2022   CD4TABS 861 03/29/2022   CD4TABS 849 11/29/2021    RPR and STI Lab Results  Component Value Date   LABRPR REACTIVE (A) 12/07/2022   LABRPR REACTIVE (A) 03/29/2022   LABRPR REACTIVE (A) 11/29/2021   LABRPR REACTIVE (A) 08/02/2021   LABRPR REACTIVE (A) 10/27/2020   RPRTITER 1:2 (H) 12/07/2022   RPRTITER 1:2 (H) 03/29/2022   RPRTITER 1:1 (H) 11/29/2021   RPRTITER 1:2 (H) 08/02/2021   RPRTITER 1:2 (H) 10/27/2020    STI Results GC CT  12/07/2022  9:28 AM Negative  Negative   08/02/2021  3:30 PM Negative  Negative   10/27/2020  9:07 AM Negative    Negative    Negative  Negative    Positive    Negative   06/08/2020  3:29 PM Negative    Negative    Negative  Negative    Negative    Negative   02/16/2020  9:51 AM Negative  Negative   02/16/2020  9:20 AM Negative  Negative   01/30/2020  9:34 AM Negative  Negative   06/18/2019 10:32 AM Positive    Positive    Negative  Negative    Negative    Negative   06/13/2018 12:00 AM Negative    Negative    Negative  Negative    Negative  Negative   03/18/2018 12:00 AM **POSITIVE**    Negative    Negative  **POSITIVE**    Negative    Negative   09/18/2017 12:00 AM Negative    Negative    Negative  Negative    Negative    Negative   08/23/2017 12:00 AM **POSITIVE**    Negative    Negative  Negative    Negative    Negative     Hepatitis B Lab Results  Component Value Date   HEPBSAB REACTIVE (A) 07/20/2020   HEPBSAG NON-REACTIVE 07/20/2020   HEPBCAB NON-REACTIVE 07/20/2020   Hepatitis C Lab Results  Component Value Date   HEPCAB NON-REACTIVE 01/30/2020   Hepatitis A Lab Results  Component Value Date   HAV REACTIVE (A) 01/30/2020   Lipids: Lab Results  Component Value Date   CHOL 167 12/07/2022   TRIG 161 (H) 12/07/2022   HDL 49 12/07/2022   CHOLHDL 3.4 12/07/2022   LDLCALC 92 12/07/2022    TARGET  DATE: The 23rd  Assessment: Noriel presents today for his maintenance Cabenuva injections. Past injections were tolerated well without issues. Last HIV RNA was undetectable in August. Received STI testing in August and was also negative. Will defer labs. Agrees to receive the annual flu and 2024-2025 COVID vaccines today. Dr.Comer recently prescribed Crestor but he was unable to receive from [facility]. I checked and his insurance will cover a 90 day supply for $3. He would like it filled at [facility] so will transfer there.  Administered cabotegravir 600mg /6mL in left upper outer quadrant of the gluteal muscle. Administered rilpivirine 900 mg/57mL in the right upper outer quadrant of the gluteal muscle. No issues with injections. He will follow up in 2 months for next set of injections. He needs Thursday morning appointments due to his work schedule so will schedule him with Marchelle Folks. Will be due to see Dr. Luciana Axe again in February.   Plan: - Cabenuva injections administered - Administered the annual flu vaccine today - Administered the 2024-2025 COVID vaccine today  - Send Crestor to [facility] - Next injections scheduled for 03/29/23 with [facility] - Call with any issues or questions  Asif Muchow L. Barclay Lennox, PharmD, BCIDP, AAHIVP, CPP Clinical Pharmacist Practitioner Infectious Diseases Clinical Pharmacist [facility]

## 2023-01-25 ENCOUNTER — Ambulatory Visit: Payer: No Typology Code available for payment source | Admitting: Pharmacist

## 2023-01-25 ENCOUNTER — Other Ambulatory Visit: Payer: Self-pay

## 2023-01-25 ENCOUNTER — Encounter: Payer: Self-pay | Admitting: Pharmacist

## 2023-01-25 ENCOUNTER — Other Ambulatory Visit (HOSPITAL_COMMUNITY): Payer: Self-pay

## 2023-01-25 DIAGNOSIS — Z23 Encounter for immunization: Secondary | ICD-10-CM | POA: Diagnosis not present

## 2023-01-25 DIAGNOSIS — B20 Human immunodeficiency virus [HIV] disease: Secondary | ICD-10-CM

## 2023-01-25 MED ORDER — CABOTEGRAVIR & RILPIVIRINE ER 600 & 900 MG/3ML IM SUER
1.0000 | Freq: Once | INTRAMUSCULAR | Status: AC
Start: 2023-01-25 — End: 2023-01-25
  Administered 2023-01-25: 1 via INTRAMUSCULAR

## 2023-01-26 ENCOUNTER — Other Ambulatory Visit (HOSPITAL_COMMUNITY): Payer: Self-pay

## 2023-01-26 MED ORDER — ROSUVASTATIN CALCIUM 10 MG PO TABS
10.0000 mg | ORAL_TABLET | Freq: Every day | ORAL | 3 refills | Status: AC
  Filled 2023-01-26: qty 90, 90d supply, fill #0

## 2023-02-02 ENCOUNTER — Other Ambulatory Visit (HOSPITAL_COMMUNITY): Payer: Self-pay

## 2023-03-17 ENCOUNTER — Telehealth: Payer: No Typology Code available for payment source | Admitting: Family Medicine

## 2023-03-17 NOTE — Progress Notes (Signed)
Pt did not show for visit. DWB 

## 2023-03-21 IMAGING — MR MR KNEE*R* W/O CM
4 of 6 series · 19 of 40 positions shown · non-contrast
Comparison: None.

CLINICAL DATA: Right knee pain. No known injury. Ongoing for 10
years.

EXAM:
MRI OF THE RIGHT KNEE WITHOUT CONTRAST
TECHNIQUE: Multiplanar, multisequence MR imaging of the knee was performed. No
intravenous contrast was administered.

[Series 2: T2 fat-sat · axial · 4.0mm · 0.29mm/px · z∈[-70,+9]mm · 3 of 28 slices shown (1 of 2)]
[im 5/28]
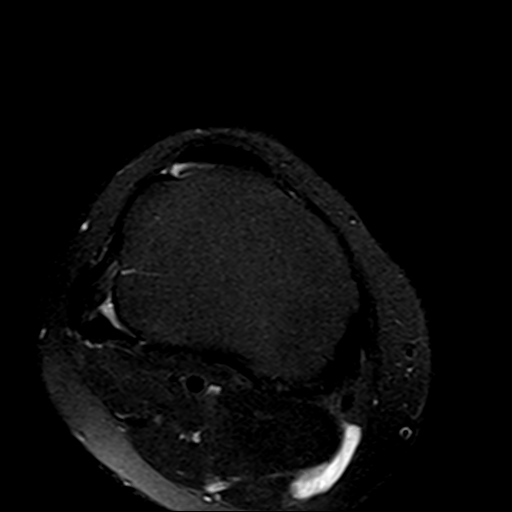
[im 14/28]
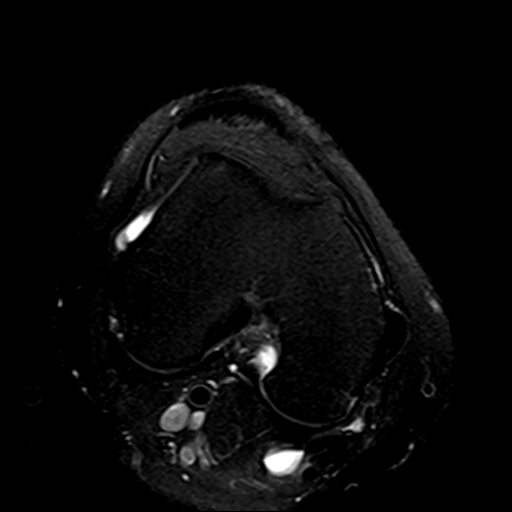
[im 23/28]
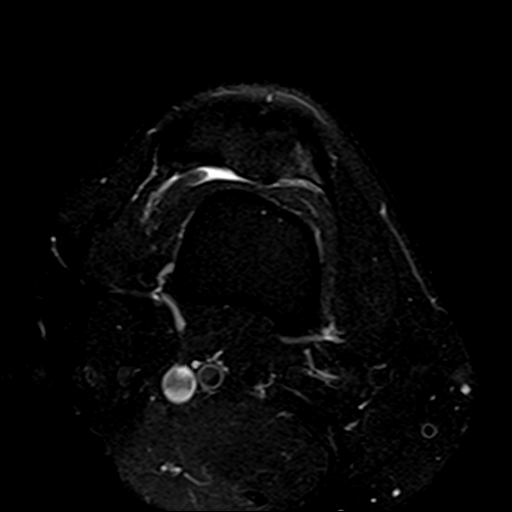

[Series 4: T2 fat-sat · coronal · 4.0mm · 0.29mm/px · 3 of 24 slices shown (2 of 2)]
[im 5/24]
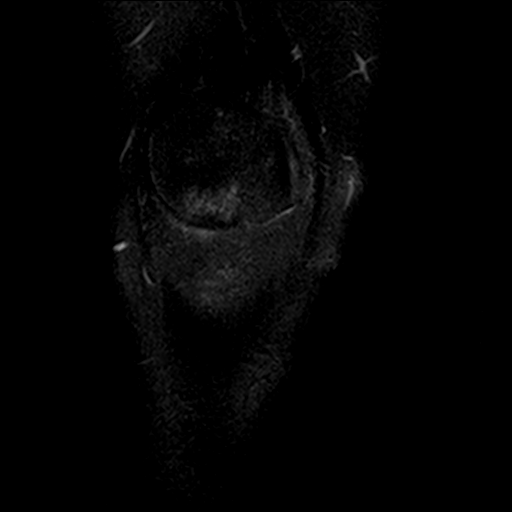
[im 14/24]
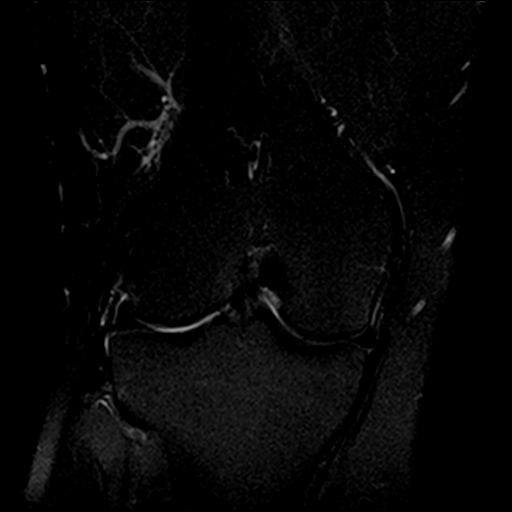
[im 24/24]
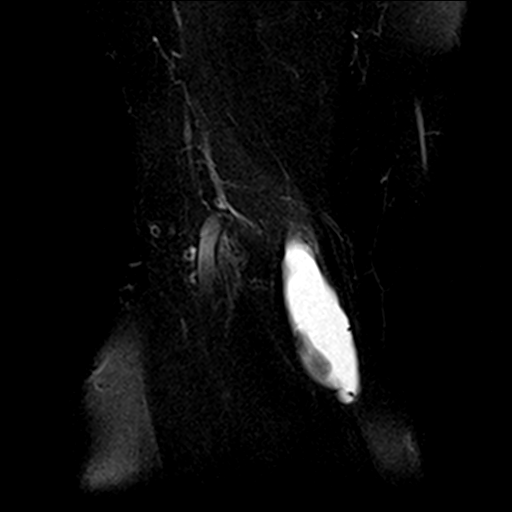

[Series 5: PD fat-sat · coronal · 3.0mm · 0.29mm/px · 7 of 28 slices shown (1 of 2)]
[im 1/28]
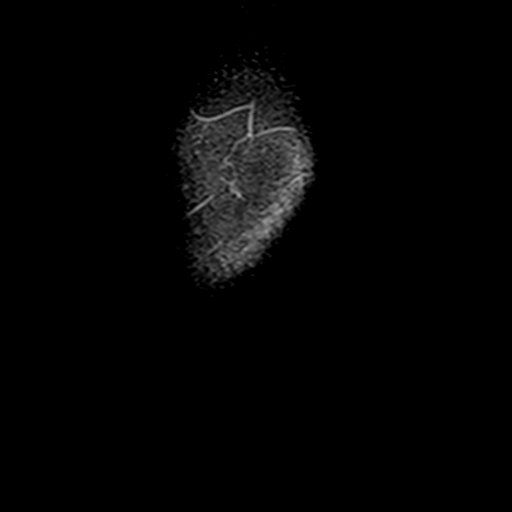
[im 5/28]
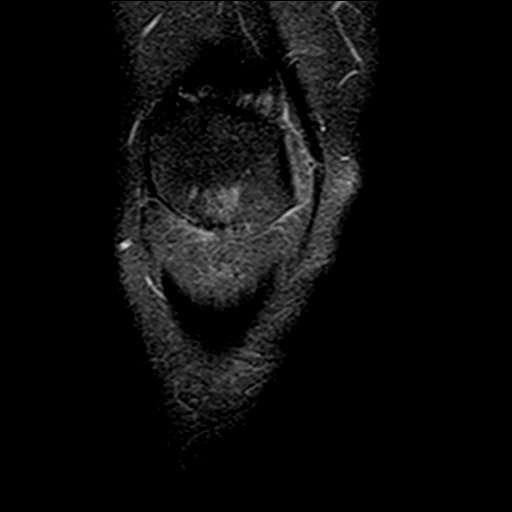
[im 10/28]
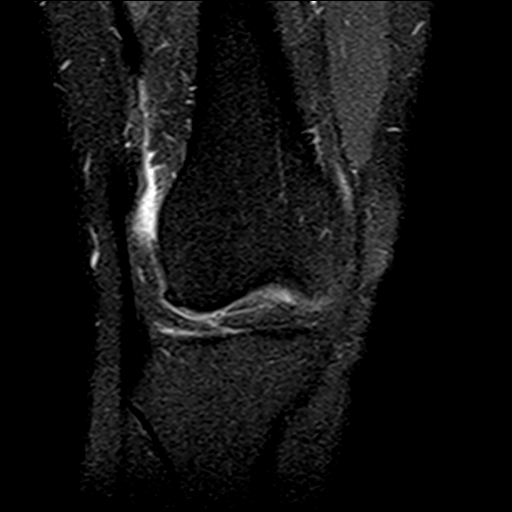
[im 14/28]
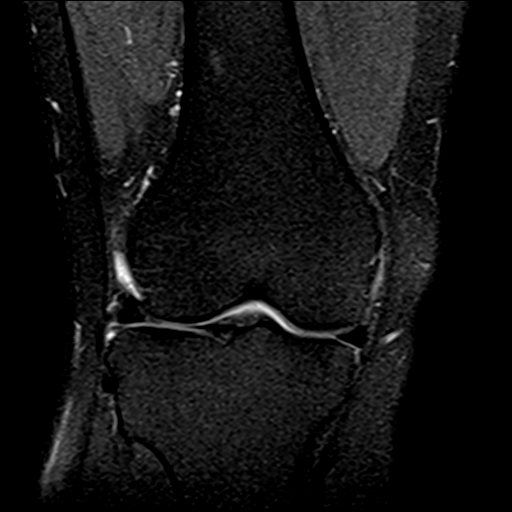
[im 19/28]
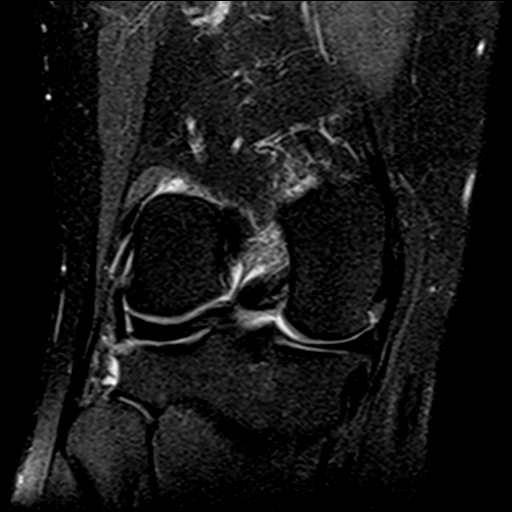
[im 23/28]
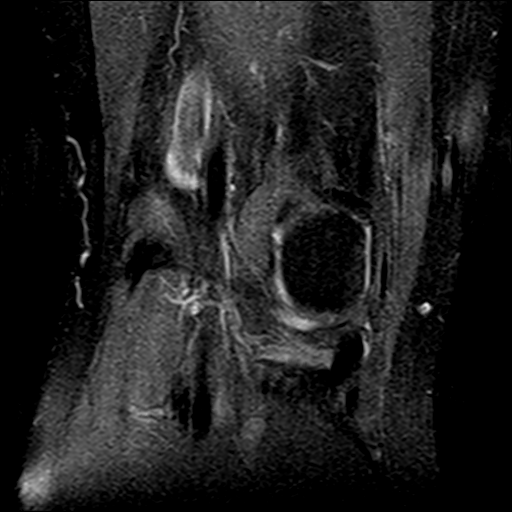
[im 28/28]
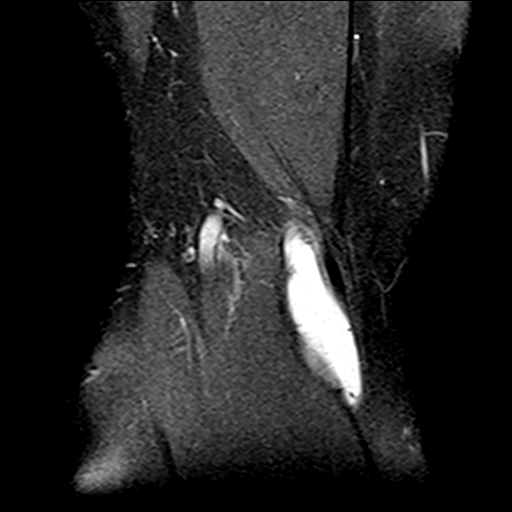

[Series 6: PD fat-sat · sagittal · 3.0mm · 0.29mm/px · 6 of 30 slices shown (2 of 2)]
[im 1/30]
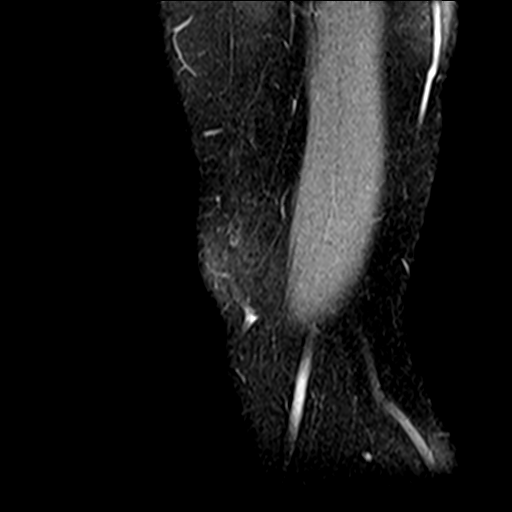
[im 5/30]
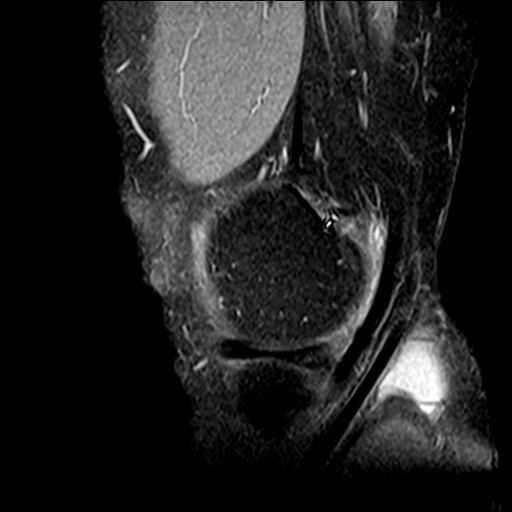
[im 10/30]
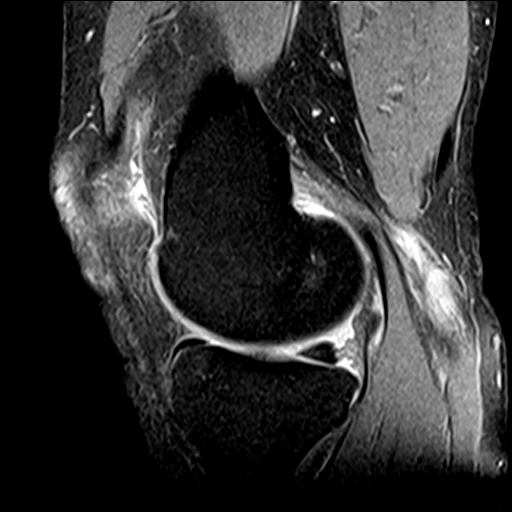
[im 15/30]
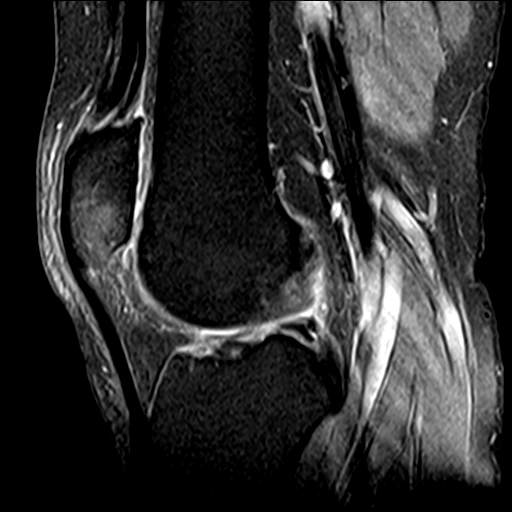
[im 20/30]
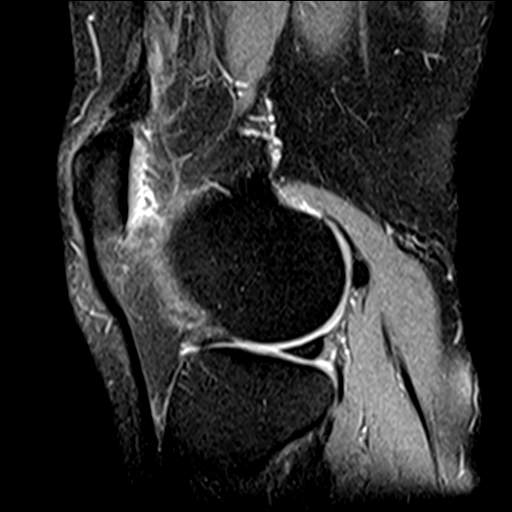
[im 25/30]
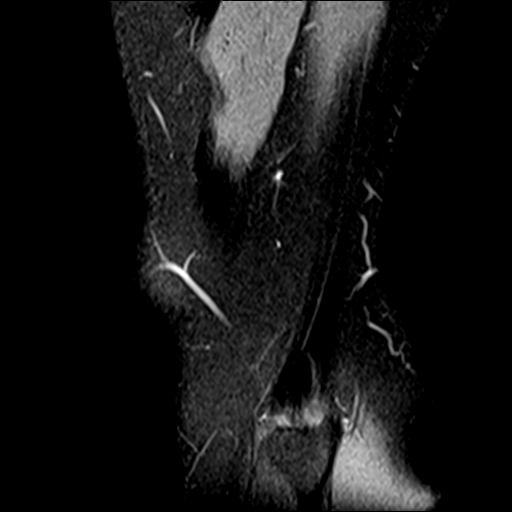

[19 of 40 positions shown; findings below may reference images not displayed]

FINDINGS: MENISCI

Medial: Intact.

Lateral: Tiny radial tear at the free edge of the body of the
lateral meniscus.

LIGAMENTS

Cruciates: ACL and PCL are intact.

Collaterals: Medial collateral ligament is intact. Lateral
collateral ligament complex is intact.

CARTILAGE

Patellofemoral: Extensive full-thickness cartilage loss of the
lateral patellofemoral compartment with subchondral reactive marrow
edema.

Medial: Mild partial-thickness cartilage loss of the medial
femorotibial compartment.

Lateral: Partial thickness cartilage loss of the lateral
femorotibial compartment.

JOINT: No joint effusion. Normal Linggo Tika. No plical
thickening.

POPLITEAL FOSSA: Popliteus tendon is intact. Small Baker's cyst.

EXTENSOR MECHANISM: Intact quadriceps tendon. Intact patellar
tendon. Intact lateral patellar retinaculum. Intact medial patellar
retinaculum. Intact MPFL.

BONES: No aggressive osseous lesion. No fracture or dislocation.

Other: No fluid collection or hematoma. Muscles are normal.
IMPRESSION: 1. Tiny radial tear at the free edge of the body of the lateral
meniscus.
2. Tricompartmental cartilage abnormalities as described above most
severe in the lateral patellofemoral compartment.

## 2023-03-22 ENCOUNTER — Telehealth: Payer: Self-pay

## 2023-03-22 NOTE — Telephone Encounter (Signed)
[facility] Patient Advocate Encounter  Patient's medications CABENUVA have been couriered to [facility] from [facility] and will be administered at the patients appointment on 03/29/23.  Kae Heller, CPhT Specialty Pharmacy Patient [facility] for Infectious Disease Phone: [phone] Fax:  [phone]

## 2023-03-26 NOTE — Progress Notes (Signed)
HPI: Curtis Trevino is a 57 y.o. male who presents to the [facility] pharmacy clinic for [place] administration.  Patient Active Problem List   Diagnosis Date Noted   Encounter for long-term (current) use of high-risk medication 12/07/2022   Diarrhea 03/29/2022   Routine screening for STI (sexually transmitted infection) 11/29/2021   COVID 04/26/2021   Migraine 09/01/2020   Therapeutic drug monitoring 07/20/2020   Rectal bleeding 07/20/2020   HIV disease (HCC) 02/16/2020   Healthcare maintenance 02/16/2020   Immunization counseling 02/16/2020   Syphilis 02/16/2020   High risk sexual behavior 02/16/2020   Polyarthralgia 04/23/2019   Prediabetes 04/23/2019   Chronic pain of both knees 03/24/2019   Bilateral hand pain 03/24/2019   Pain of great toe, right 03/24/2019    Patient's Medications  New Prescriptions   No medications on file  Previous Medications   ASPIRIN-ACETAMINOPHEN-CAFFEINE (EXCEDRIN MIGRAINE) 250-250-65 MG TABLET    Take by mouth every 6 (six) hours as needed for headache.   CABOTEGRAVIR & RILPIVIRINE ER (CABENUVA) 600 & 900 MG/3ML INJECTION    Inject 1 kit into the muscle every 2 (two) months.   DICLOFENAC SODIUM (VOLTAREN) 1 % GEL    Apply 4 g topically 4 (four) times daily.   DULOXETINE (CYMBALTA) 20 MG CAPSULE    Take 1 capsule (20 mg total) by mouth daily.   FLUTICASONE (FLONASE) 50 MCG/ACT NASAL SPRAY    Place 2 sprays into both nostrils daily.   ROSUVASTATIN (CRESTOR) 10 MG TABLET    Take 1 tablet (10 mg total) by mouth daily.   TOPIRAMATE (TOPAMAX) 25 MG TABLET    Take 1 tablet by mouth twice daily  Modified Medications   No medications on file  Discontinued Medications   No medications on file    Allergies: Allergies  Allergen Reactions   Codeine Hives, Itching and Rash   Tylenol [Acetaminophen] Nausea And Vomiting    Vomiting every time he takes tylenol    Past Medical History: Past Medical History:  Diagnosis Date   Acid reflux    Chronic  back pain     Social History: Social History   Socioeconomic History   Marital status: Single    Spouse name: Not on file   Number of children: Not on file   Years of education: Not on file   Highest education level: Not on file  Occupational History   Not on file  Tobacco Use   Smoking status: Former    Current packs/day: 0.00    Types: Cigarettes    Quit date: 11/15/2010    Years since quitting: 12.3   Smokeless tobacco: Never  Substance and Sexual Activity   Alcohol use: Not Currently    Alcohol/week: 3.0 standard drinks of alcohol    Types: 3 Shots of liquor per week    Comment: 3 drink a day   Drug use: No   Sexual activity: Yes    Partners: Male    Comment: declined condoms  Other Topics Concern   Not on file  Social History Narrative   Not on file   Social Drivers of Health   Financial Resource Strain: Not on file  Food Insecurity: Not on file  Transportation Needs: Not on file  Physical Activity: Not on file  Stress: Not on file  Social Connections: Not on file    Labs: Lab Results  Component Value Date   HIV1RNAQUANT Not Detected 12/07/2022   HIV1RNAQUANT Not Detected 07/27/2022   HIV1RNAQUANT <20 (H)  03/29/2022   CD4TABS 750 12/07/2022   CD4TABS 861 03/29/2022   CD4TABS 849 11/29/2021    RPR and STI Lab Results  Component Value Date   LABRPR REACTIVE (A) 12/07/2022   LABRPR REACTIVE (A) 03/29/2022   LABRPR REACTIVE (A) 11/29/2021   LABRPR REACTIVE (A) 08/02/2021   LABRPR REACTIVE (A) 10/27/2020   RPRTITER 1:2 (H) 12/07/2022   RPRTITER 1:2 (H) 03/29/2022   RPRTITER 1:1 (H) 11/29/2021   RPRTITER 1:2 (H) 08/02/2021   RPRTITER 1:2 (H) 10/27/2020    STI Results GC CT  12/07/2022  9:28 AM Negative  Negative   08/02/2021  3:30 PM Negative  Negative   10/27/2020  9:07 AM Negative    Negative    Negative  Negative    Positive    Negative   06/08/2020  3:29 PM Negative    Negative    Negative  Negative    Negative    Negative    02/16/2020  9:51 AM Negative  Negative   02/16/2020  9:20 AM Negative  Negative   01/30/2020  9:34 AM Negative  Negative   06/18/2019 10:32 AM Positive    Positive    Negative  Negative    Negative    Negative   06/13/2018 12:00 AM Negative    Negative    Negative  Negative    Negative    Negative   03/18/2018 12:00 AM **POSITIVE**    Negative    Negative  **POSITIVE**    Negative    Negative   09/18/2017 12:00 AM Negative    Negative    Negative  Negative    Negative    Negative   08/23/2017 12:00 AM **POSITIVE**    Negative    Negative  Negative    Negative    Negative     Hepatitis B Lab Results  Component Value Date   HEPBSAB REACTIVE (A) 07/20/2020   HEPBSAG NON-REACTIVE 07/20/2020   HEPBCAB NON-REACTIVE 07/20/2020   Hepatitis C Lab Results  Component Value Date   HEPCAB NON-REACTIVE 01/30/2020   Hepatitis A Lab Results  Component Value Date   HAV REACTIVE (A) 01/30/2020   Lipids: Lab Results  Component Value Date   CHOL 167 12/07/2022   TRIG 161 (H) 12/07/2022   HDL 49 12/07/2022   CHOLHDL 3.4 12/07/2022   LDLCALC 92 12/07/2022    TARGET DATE:  The 23rd of the month  Assessment: Curtis Trevino presents today for their maintenance Cabenuva injections. Initial/past injections were tolerated well without issues. No problems with systemic effects of injections.  Administered cabotegravir 600mg /62mL in left upper outer quadrant of the gluteal muscle. Administered rilpivirine 900 mg/69mL in the right upper outer quadrant of the gluteal muscle. Monitored patient for 10 minutes after injection. Injections were tolerated well without issue. Patient will follow up in 2 months for next injection. Will defer checking HIV RNA until next visit with Dr. Luciana Axe.  Politely declines STI testing today. Due for Shingrix which we are unable to administer for him at this time.  Plan: - Cabenuva injections administered - Next injections scheduled for 2/20 with Dr. Luciana Axe and  4/17 with me  - Call with any issues or questions  Margarite Gouge, PharmD, CPP, BCIDP, AAHIVP Clinical Pharmacist Practitioner Infectious Diseases Clinical Pharmacist [facility]

## 2023-03-29 ENCOUNTER — Ambulatory Visit: Payer: No Typology Code available for payment source | Admitting: Pharmacist

## 2023-03-29 ENCOUNTER — Other Ambulatory Visit: Payer: Self-pay

## 2023-03-29 ENCOUNTER — Other Ambulatory Visit (HOSPITAL_COMMUNITY): Payer: Self-pay

## 2023-03-29 DIAGNOSIS — B20 Human immunodeficiency virus [HIV] disease: Secondary | ICD-10-CM

## 2023-03-29 MED ORDER — CABOTEGRAVIR & RILPIVIRINE ER 600 & 900 MG/3ML IM SUER
1.0000 | Freq: Once | INTRAMUSCULAR | Status: AC
Start: 2023-03-29 — End: 2023-03-29
  Administered 2023-03-29: 1 via INTRAMUSCULAR

## 2023-04-24 ENCOUNTER — Other Ambulatory Visit (HOSPITAL_COMMUNITY): Payer: Self-pay

## 2023-04-25 ENCOUNTER — Other Ambulatory Visit (HOSPITAL_COMMUNITY): Payer: Self-pay

## 2023-05-09 ENCOUNTER — Encounter (HOSPITAL_BASED_OUTPATIENT_CLINIC_OR_DEPARTMENT_OTHER): Payer: Self-pay | Admitting: Emergency Medicine

## 2023-05-09 ENCOUNTER — Emergency Department (HOSPITAL_BASED_OUTPATIENT_CLINIC_OR_DEPARTMENT_OTHER)
Admission: EM | Admit: 2023-05-09 | Discharge: 2023-05-09 | Disposition: A | Discharge status: Home / Self Care | Payer: No Typology Code available for payment source | Attending: Emergency Medicine | Admitting: Emergency Medicine

## 2023-05-09 ENCOUNTER — Other Ambulatory Visit: Payer: Self-pay

## 2023-05-09 ENCOUNTER — Other Ambulatory Visit (HOSPITAL_BASED_OUTPATIENT_CLINIC_OR_DEPARTMENT_OTHER): Payer: Self-pay

## 2023-05-09 DIAGNOSIS — M545 Low back pain, unspecified: Secondary | ICD-10-CM | POA: Diagnosis not present

## 2023-05-09 DIAGNOSIS — B20 Human immunodeficiency virus [HIV] disease: Secondary | ICD-10-CM | POA: Diagnosis not present

## 2023-05-09 LAB — URINALYSIS, ROUTINE W REFLEX MICROSCOPIC
Bilirubin Urine: NEGATIVE
Glucose, UA: NEGATIVE mg/dL
Hgb urine dipstick: NEGATIVE
Ketones, ur: NEGATIVE mg/dL
Leukocytes,Ua: NEGATIVE
Nitrite: NEGATIVE
Protein, ur: NEGATIVE mg/dL
Specific Gravity, Urine: 1.009 (ref 1.005–1.030)
pH: 6.5 (ref 5.0–8.0)

## 2023-05-09 MED ORDER — IBUPROFEN 800 MG PO TABS
800.0000 mg | ORAL_TABLET | Freq: Once | ORAL | Status: AC
  Administered 2023-05-09: 800 mg via ORAL
  Filled 2023-05-09: qty 1

## 2023-05-09 MED ORDER — IBUPROFEN 600 MG PO TABS
600.0000 mg | ORAL_TABLET | Freq: Four times a day (QID) | ORAL | 0 refills | Status: AC | PRN
  Filled 2023-05-09: qty 30, 8d supply, fill #0

## 2023-05-09 MED ORDER — OXYCODONE HCL 5 MG PO TABS
10.0000 mg | ORAL_TABLET | Freq: Once | ORAL | Status: AC
  Administered 2023-05-09: 10 mg via ORAL
  Filled 2023-05-09: qty 2

## 2023-05-09 MED ORDER — CYCLOBENZAPRINE HCL 10 MG PO TABS
10.0000 mg | ORAL_TABLET | Freq: Two times a day (BID) | ORAL | 0 refills | Status: AC | PRN
  Filled 2023-05-09: qty 20, 10d supply, fill #0

## 2023-05-09 MED ORDER — CYCLOBENZAPRINE HCL 5 MG PO TABS
5.0000 mg | ORAL_TABLET | Freq: Once | ORAL | Status: AC
  Administered 2023-05-09: 5 mg via ORAL
  Filled 2023-05-09: qty 1

## 2023-05-09 MED ORDER — LIDOCAINE 5 % EX PTCH
1.0000 | MEDICATED_PATCH | CUTANEOUS | Status: DC
  Administered 2023-05-09: 1 via TRANSDERMAL
  Filled 2023-05-09: qty 1

## 2023-05-09 MED ORDER — DICLOFENAC SODIUM 1 % EX GEL
4.0000 g | Freq: Four times a day (QID) | CUTANEOUS | 0 refills | Status: AC
  Filled 2023-05-09: qty 400, 90d supply, fill #0

## 2023-05-09 NOTE — ED Triage Notes (Signed)
Pt c/o lumbar pain after attempting to stand this morning. Similar pain x 3 weeks pta. Endorses Excedrin and ibuprofen at 0800 today

## 2023-05-09 NOTE — Discharge Instructions (Addendum)
You have been evaluated for your symptoms.  Your pain is likely musculoskeletal pain causing back spasm and burning sensation.  Please continue to apply Voltaren gel to the affected area, take warm bath for comfort, take muscle relaxant at night to help you sleep and take ibuprofen as anti-inflammatory.  You may follow-up with your orthopedist Dr. Otelia Sergeant for outpatient management.  Your urine today did not show any signs of infection.

## 2023-05-09 NOTE — ED Provider Notes (Signed)
[facility] Provider Note   CSN: [id number] Arrival date & time: 05/09/23  1248     History  Chief Complaint  Patient presents with   Back Pain    Curtis Trevino is a 58 y.o. male.  The history is provided by the patient and medical records. No language interpreter was used.  Back Pain    58 year old male history of HIV, chronic knee pain, polyarthralgia, chronic back pain presenting with complaint of low back pain.  Patient states 3 weeks ago while at work he felt pain to his lower back.  He described pain as a burning sensation with some back spasm.  That went away but today the same pain returns.  Pain was quite intense that caused him to lean down and he nearly fell to the ground.  Pain is primarily to his lower back, left buttock region with  pins-and-needles sensation.  No associated fever or chills no abdominal pain no bowel bladder incontinence or saddle anesthesia.  He does notice some increased urinary frequency and occasionally with some burning sensation for the past week.  No penile discharge no blood in the urine.  No history of kidney stone.  No history of IV drug use or active cancer.  He has had similar back pain like this in the past and has received injection in his back with some relief.  He did try taking over-the-counter medication including Tylenol and ibuprofen at home without adequate relief.  He does have Vicodin and Flexeril from previous visit but have not used them.  He mention his CD4 count and viral load are well-controlled.  Home Medications Prior to Admission medications   Medication Sig Start Date End Date Taking? Authorizing Provider  aspirin-acetaminophen-caffeine (EXCEDRIN MIGRAINE) [phone] MG tablet Take by mouth every 6 (six) hours as needed for headache.    [provider]  cabotegravir & rilpivirine ER (CABENUVA) 600 & 900 MG/3ML injection Inject 1 kit into the muscle every 2 (two) months.  09/21/22   Jennette Kettle, RPH-CPP  diclofenac Sodium (VOLTAREN) 1 % GEL Apply 4 g topically 4 (four) times daily. 05/25/21   Kerrin Champagne, MD  DULoxetine (CYMBALTA) 20 MG capsule Take 1 capsule (20 mg total) by mouth daily. Patient not taking: Reported on 12/07/2022 07/06/21   Kerrin Champagne, MD  fluticasone [facility]) 50 MCG/ACT nasal spray Place 2 sprays into both nostrils daily. 06/21/22   Carney Living, MD  rosuvastatin (CRESTOR) 10 MG tablet Take 1 tablet (10 mg total) by mouth daily. 01/26/23   Kuppelweiser, Cassie L, RPH-CPP  topiramate (TOPAMAX) 25 MG tablet Take 1 tablet by mouth twice daily 06/29/21   Valetta Close, MD  cetirizine (ZYRTEC) 10 MG tablet Take 1 tablet (10 mg total) by mouth daily. 08/08/18 12/09/18  Janace Aris, FNP  omeprazole (PRILOSEC OTC) 20 MG tablet Take 20 mg by mouth at bedtime.   12/09/18  [provider]      Allergies    Codeine and Tylenol [acetaminophen]    Review of Systems   Review of Systems  Musculoskeletal:  Positive for back pain.  All other systems reviewed and are negative.   Physical Exam Updated Vital Signs BP (!) 117/90   Pulse 81   Resp 20   Wt 88.5 kg   SpO2 100%   BMI 29.22 kg/m  Physical Exam Vitals and nursing note reviewed.  Constitutional:      General: He is not in  acute distress.    Appearance: He is well-developed.  HENT:     Head: Atraumatic.  Eyes:     Conjunctiva/sclera: Conjunctivae normal.  Cardiovascular:     Rate and Rhythm: Normal rate and regular rhythm.     Pulses: Normal pulses.     Heart sounds: Normal heart sounds.  Abdominal:     Palpations: Abdomen is soft.     Tenderness: There is no abdominal tenderness.  Musculoskeletal:        General: Tenderness (Mild tenderness to lumbar and left lumbar sacral region without any overlying skin changes.) present.     Cervical back: Neck supple.     Comments: Negative straight leg raise bilaterally.  Intact dorsalis pedis pulse bilaterally.   Skin:    Findings: No rash.  Neurological:     Mental Status: He is alert.     Comments: Able to ambulate.     ED Results / Procedures / Treatments   Labs (all labs ordered are listed, but only abnormal results are displayed) Labs Reviewed  URINALYSIS, ROUTINE W REFLEX MICROSCOPIC - Abnormal; Notable for the following components:      Result Value   Color, Urine COLORLESS (*)    All other components within normal limits    EKG None  Radiology No results found.  Procedures Procedures    Medications Ordered in ED Medications  lidocaine (LIDODERM) 5 % 1 patch (1 patch Transdermal Patch Applied 05/09/23 1330)  oxyCODONE (Oxy IR/ROXICODONE) immediate release tablet 10 mg (10 mg Oral Given 05/09/23 1329)  cyclobenzaprine (FLEXERIL) tablet 5 mg (5 mg Oral Given 05/09/23 1330)  ibuprofen (ADVIL) tablet 800 mg (800 mg Oral Given 05/09/23 1453)    ED Course/ Medical Decision Making/ A&P                                 Medical Decision Making Risk Prescription drug management.   BP (!) 117/90   Pulse 81   Resp 20   Wt 88.5 kg   SpO2 100%   BMI 29.22 kg/m   26:4 PM  58 year old male history of HIV, chronic knee pain, polyarthralgia, chronic back pain presenting with complaint of low back pain.  Patient states 3 weeks ago while at work he felt pain to his lower back.  He described pain as a burning sensation with some back spasm.  That went away but today the same pain returns.  Pain was quite intense that caused him to lean down and he nearly fell to the ground.  Pain is primarily to his lower back, left buttock region with  pins-and-needles sensation.  No associated fever or chills no abdominal pain no bowel bladder incontinence or saddle anesthesia.  He does notice some increased urinary frequency and occasionally with some burning sensation for the past week.  No penile discharge no blood in the urine.  No history of kidney stone.  No history of IV drug use or active  cancer.  He has had similar back pain like this in the past and has received injection in his back with some relief.  He did try taking over-the-counter medication including Tylenol and ibuprofen at home without adequate relief.  He does have Vicodin and Flexeril from previous visit but have not used them.  He mention his CD4 count and viral load are well-controlled.  Exam notable for mild tenderness to lumbar and left lumbosacral region on palpation without any signs  of infection.  No trauma to suggest fracture or dislocation.  Negative straight leg raise, doubt sciatica or cauda equina.  Does have history of HIV however he is compliant with his medication and his CD4 count as well as his viral load within normal limit.  At this time, I felt advanced imaging such as MRI of the spine, perform a lumbar puncture, or x-ray of the spine is not indicated as this pain is likely musculoskeletal.  I also have low suspicion for kidney stones cauda equina.  After further discussion, will discharge home with muscle relaxant, recommend using Voltaren gel, stretch, taking warm bath, and to follow-up with his orthopedist for further care.  Return precaution given.  Patient did report improvement with Lidoderm patch, ibuprofen, Percocet, and Flexeril was given during this visit.        Final Clinical Impression(s) / ED Diagnoses Final diagnoses:  Acute left-sided low back pain without sciatica    Rx / DC Orders ED Discharge Orders          Ordered    cyclobenzaprine (FLEXERIL) 10 MG tablet  2 times daily PRN        05/09/23 1510    ibuprofen (ADVIL) 600 MG tablet  Every 6 hours PRN        05/09/23 1510              Fayrene Helper, PA-C 05/09/23 1513    Ernie Avena, MD 05/09/23 1658

## 2023-05-11 ENCOUNTER — Other Ambulatory Visit (HOSPITAL_COMMUNITY): Payer: Self-pay

## 2023-05-16 ENCOUNTER — Other Ambulatory Visit (HOSPITAL_COMMUNITY): Payer: Self-pay

## 2023-05-21 ENCOUNTER — Other Ambulatory Visit (HOSPITAL_BASED_OUTPATIENT_CLINIC_OR_DEPARTMENT_OTHER): Payer: Self-pay

## 2023-05-21 ENCOUNTER — Emergency Department (HOSPITAL_COMMUNITY): Payer: No Typology Code available for payment source

## 2023-05-21 ENCOUNTER — Emergency Department (HOSPITAL_COMMUNITY)
Admission: EM | Admit: 2023-05-21 | Discharge: 2023-05-21 | Disposition: A | Discharge status: Home / Self Care | Payer: No Typology Code available for payment source | Attending: Emergency Medicine | Admitting: Emergency Medicine

## 2023-05-21 ENCOUNTER — Other Ambulatory Visit (HOSPITAL_COMMUNITY): Payer: Self-pay

## 2023-05-21 DIAGNOSIS — R110 Nausea: Secondary | ICD-10-CM | POA: Diagnosis not present

## 2023-05-21 DIAGNOSIS — R509 Fever, unspecified: Secondary | ICD-10-CM | POA: Insufficient documentation

## 2023-05-21 DIAGNOSIS — M549 Dorsalgia, unspecified: Secondary | ICD-10-CM | POA: Diagnosis not present

## 2023-05-21 DIAGNOSIS — R000 Tachycardia, unspecified: Secondary | ICD-10-CM | POA: Diagnosis not present

## 2023-05-21 DIAGNOSIS — Z21 Asymptomatic human immunodeficiency virus [HIV] infection status: Secondary | ICD-10-CM | POA: Diagnosis not present

## 2023-05-21 DIAGNOSIS — Z20822 Contact with and (suspected) exposure to covid-19: Secondary | ICD-10-CM | POA: Diagnosis not present

## 2023-05-21 DIAGNOSIS — R0989 Other specified symptoms and signs involving the circulatory and respiratory systems: Secondary | ICD-10-CM | POA: Diagnosis not present

## 2023-05-21 DIAGNOSIS — R112 Nausea with vomiting, unspecified: Secondary | ICD-10-CM | POA: Insufficient documentation

## 2023-05-21 DIAGNOSIS — R197 Diarrhea, unspecified: Secondary | ICD-10-CM | POA: Insufficient documentation

## 2023-05-21 DIAGNOSIS — R1111 Vomiting without nausea: Secondary | ICD-10-CM | POA: Diagnosis not present

## 2023-05-21 DIAGNOSIS — R109 Unspecified abdominal pain: Secondary | ICD-10-CM | POA: Diagnosis not present

## 2023-05-21 DIAGNOSIS — K5730 Diverticulosis of large intestine without perforation or abscess without bleeding: Secondary | ICD-10-CM | POA: Diagnosis not present

## 2023-05-21 LAB — CBC WITH DIFFERENTIAL/PLATELET
Abs Immature Granulocytes: 0.05 10*3/uL (ref 0.00–0.07)
Basophils Absolute: 0.1 10*3/uL (ref 0.0–0.1)
Basophils Relative: 1 %
Eosinophils Absolute: 0 10*3/uL (ref 0.0–0.5)
Eosinophils Relative: 0 %
HCT: 41.3 % (ref 39.0–52.0)
Hemoglobin: 13.4 g/dL (ref 13.0–17.0)
Immature Granulocytes: 1 %
Lymphocytes Relative: 9 %
Lymphs Abs: 1 10*3/uL (ref 0.7–4.0)
MCH: 27.8 pg (ref 26.0–34.0)
MCHC: 32.4 g/dL (ref 30.0–36.0)
MCV: 85.7 fL (ref 80.0–100.0)
Monocytes Absolute: 1.2 10*3/uL — ABNORMAL HIGH (ref 0.1–1.0)
Monocytes Relative: 11 %
Neutro Abs: 8.4 10*3/uL — ABNORMAL HIGH (ref 1.7–7.7)
Neutrophils Relative %: 78 %
Platelets: 228 10*3/uL (ref 150–400)
RBC: 4.82 MIL/uL (ref 4.22–5.81)
RDW: 13.7 % (ref 11.5–15.5)
WBC: 10.7 10*3/uL — ABNORMAL HIGH (ref 4.0–10.5)
nRBC: 0 % (ref 0.0–0.2)

## 2023-05-21 LAB — RESP PANEL BY RT-PCR (RSV, FLU A&B, COVID)  RVPGX2
Influenza A by PCR: NEGATIVE
Influenza B by PCR: NEGATIVE
Resp Syncytial Virus by PCR: NEGATIVE
SARS Coronavirus 2 by RT PCR: NEGATIVE

## 2023-05-21 LAB — URINALYSIS, W/ REFLEX TO CULTURE (INFECTION SUSPECTED)
Bacteria, UA: NONE SEEN
Bilirubin Urine: NEGATIVE
Glucose, UA: NEGATIVE mg/dL
Hgb urine dipstick: NEGATIVE
Ketones, ur: NEGATIVE mg/dL
Leukocytes,Ua: NEGATIVE
Nitrite: NEGATIVE
Protein, ur: NEGATIVE mg/dL
Specific Gravity, Urine: 1.021 (ref 1.005–1.030)
pH: 5 (ref 5.0–8.0)

## 2023-05-21 LAB — COMPREHENSIVE METABOLIC PANEL
ALT: 23 U/L (ref 0–44)
AST: 25 U/L (ref 15–41)
Albumin: 4 g/dL (ref 3.5–5.0)
Alkaline Phosphatase: 42 U/L (ref 38–126)
Anion gap: 10 (ref 5–15)
BUN: 20 mg/dL (ref 6–20)
CO2: 20 mmol/L — ABNORMAL LOW (ref 22–32)
Calcium: 9.1 mg/dL (ref 8.9–10.3)
Chloride: 106 mmol/L (ref 98–111)
Creatinine, Ser: 1.25 mg/dL — ABNORMAL HIGH (ref 0.61–1.24)
GFR, Estimated: >60 mL/min (ref >60)
Glucose, Bld: 158 mg/dL — ABNORMAL HIGH (ref 70–99)
Potassium: 3.7 mmol/L (ref 3.5–5.1)
Sodium: 136 mmol/L (ref 135–145)
Total Bilirubin: 0.5 mg/dL (ref 0.0–1.2)
Total Protein: 7.7 g/dL (ref 6.5–8.1)

## 2023-05-21 LAB — CK: Total CK: 123 U/L (ref 49–397)

## 2023-05-21 LAB — LACTIC ACID, PLASMA: Lactic Acid, Venous: 1.7 mmol/L (ref 0.5–1.9)

## 2023-05-21 MED ORDER — ONDANSETRON 4 MG PO TBDP
4.0000 mg | ORAL_TABLET | Freq: Three times a day (TID) | ORAL | 0 refills | Status: AC | PRN
  Filled 2023-05-21 – 2023-06-05 (×2): qty 10, 4d supply, fill #0

## 2023-05-21 MED ORDER — IBUPROFEN 800 MG PO TABS
800.0000 mg | ORAL_TABLET | Freq: Once | ORAL | Status: AC
  Administered 2023-05-21: 800 mg via ORAL
  Filled 2023-05-21: qty 1

## 2023-05-21 MED ORDER — FENTANYL CITRATE PF 50 MCG/ML IJ SOSY
50.0000 ug | PREFILLED_SYRINGE | Freq: Once | INTRAMUSCULAR | Status: AC
Start: 2023-05-21 — End: 2023-05-21
  Administered 2023-05-21: 50 ug via INTRAVENOUS
  Filled 2023-05-21: qty 1

## 2023-05-21 MED ORDER — SODIUM CHLORIDE 0.9 % IV BOLUS
1000.0000 mL | Freq: Once | INTRAVENOUS | Status: AC
  Administered 2023-05-21: 1000 mL via INTRAVENOUS

## 2023-05-21 MED ORDER — DICYCLOMINE HCL 20 MG PO TABS
20.0000 mg | ORAL_TABLET | Freq: Two times a day (BID) | ORAL | 0 refills | Status: AC
  Filled 2023-05-21 – 2023-06-05 (×2): qty 20, 10d supply, fill #0

## 2023-05-21 NOTE — ED Triage Notes (Signed)
 Pt BIB EMS, c/o N/V/D, ongoing back pain chronic, pt with fever per EMS, 99.1, no tylenol  given. 4mg  Zofran , 500 NS, IV 20 G. HR 160 PER ems on arrival , now 130 after fluids per EMS. EMS EKG tachy

## 2023-05-21 NOTE — ED Provider Notes (Signed)
 [place] EMERGENCY DEPARTMENT AT [facility] Provider Note   CSN: [id number] Arrival date & time: 05/21/23  0153     History  Chief Complaint  Patient presents with   Fever    Curtis Trevino is a 58 y.o. male.  The history is provided by the patient and medical records.  Fever  58 year old male with history of HIV, chronic back pain, presenting to the ED for fever.  Patient states he went to a meeting yesterday morning for work, returned home around lunchtime and began feeling poorly.  States low grade fever at that time but continued worsening throughout the evening.  He reporst he feels achy all over, worse in his back.  Does report trouble urinating today-- feels the urge to go but has had trouble going.  Urine lately has looked orange in color.  Denies dysuria or penile discharge.  No hx of kidney stones.  Denies cough.  Has had some nausea/vomiting and diarrhea today.  No blood in stools.  Compliant with HIV treatments, last CD4 was 750.  Denies any numbness/weakness of the legs.  No bowel or bladder incontinence.    Home Medications Prior to Admission medications   Medication Sig Start Date End Date Taking? Authorizing Provider  aspirin-acetaminophen -caffeine (EXCEDRIN MIGRAINE) 250-250-65 MG tablet Take by mouth every 6 (six) hours as needed for headache.   Yes [provider]  cabotegravir  & rilpivirine  ER (CABENUVA ) 600 & 900 MG/3ML injection Inject 1 kit into the muscle every 2 (two) months. 09/21/22  Yes Sonya Duster, RPH-CPP  cyclobenzaprine  (FLEXERIL ) 10 MG tablet Take 1 tablet (10 mg total) by mouth 2 (two) times daily as needed for muscle spasms. 05/09/23  Yes Debbra Fairy, PA-C  diclofenac  Sodium (VOLTAREN ) 1 % GEL Apply 4 g topically 4 (four) times daily. 05/09/23  Yes Debbra Fairy, PA-C  fluticasone  (FLONASE ) 50 MCG/ACT nasal spray Place 2 sprays into both nostrils daily. Patient taking differently: Place 2 sprays into both nostrils daily as  needed for allergies. 06/21/22  Yes Chambliss, Vesta Gourd, MD  ibuprofen  (ADVIL ) 600 MG tablet Take 1 tablet (600 mg total) by mouth every 6 (six) hours as needed. 05/09/23  Yes Debbra Fairy, PA-C  DULoxetine  (CYMBALTA ) 20 MG capsule Take 1 capsule (20 mg total) by mouth daily. Patient not taking: Reported on 12/07/2022 07/06/21   Alphonso Jean, MD  rosuvastatin  (CRESTOR ) 10 MG tablet Take 1 tablet (10 mg total) by mouth daily. Patient not taking: Reported on 05/21/2023 01/26/23   Kuppelweiser, Cassie L, RPH-CPP  topiramate  (TOPAMAX ) 25 MG tablet Take 1 tablet by mouth twice daily Patient not taking: Reported on 05/21/2023 06/29/21   Santana Cue, MD  cetirizine  (ZYRTEC ) 10 MG tablet Take 1 tablet (10 mg total) by mouth daily. 08/08/18 12/09/18  Landa Pine, FNP  omeprazole (PRILOSEC OTC) 20 MG tablet Take 20 mg by mouth at bedtime.   12/09/18  [provider]      Allergies    Codeine and Tylenol  [acetaminophen ]    Review of Systems   Review of Systems  Constitutional:  Positive for fever.  All other systems reviewed and are negative.   Physical Exam Updated Vital Signs BP 108/77   Pulse (!) 110   Temp (!) 100.6 F (38.1 C) (Oral)   Resp 18   SpO2 95%   Physical Exam Vitals and nursing note reviewed.  Constitutional:      Appearance: He is well-developed.     Comments: Non-toxic  appearing, warm to touch  HENT:     Head: Normocephalic and atraumatic.  Eyes:     Conjunctiva/sclera: Conjunctivae normal.     Pupils: Pupils are equal, round, and reactive to light.  Cardiovascular:     Rate and Rhythm: Normal rate and regular rhythm.     Heart sounds: Normal heart sounds.  Pulmonary:     Effort: Pulmonary effort is normal.     Breath sounds: Normal breath sounds.  Abdominal:     General: Bowel sounds are normal.     Palpations: Abdomen is soft.  Musculoskeletal:        General: Normal range of motion.     Cervical back: Normal range of motion.     Comments: Normal  strength/sensations of both legs, ambulatory  Skin:    General: Skin is warm and dry.  Neurological:     Mental Status: He is alert and oriented to person, place, and time.     ED Results / Procedures / Treatments   Labs (all labs ordered are listed, but only abnormal results are displayed) Labs Reviewed  CBC WITH DIFFERENTIAL/PLATELET - Abnormal; Notable for the following components:      Result Value   WBC 10.7 (*)    Neutro Abs 8.4 (*)    Monocytes Absolute 1.2 (*)    All other components within normal limits  COMPREHENSIVE METABOLIC PANEL - Abnormal; Notable for the following components:   CO2 20 (*)    Glucose, Bld 158 (*)    Creatinine, Ser 1.25 (*)    All other components within normal limits  RESP PANEL BY RT-PCR (RSV, FLU A&B, COVID)  RVPGX2  CULTURE, BLOOD (ROUTINE X 2)  CULTURE, BLOOD (ROUTINE X 2)  LACTIC ACID, PLASMA  URINALYSIS, W/ REFLEX TO CULTURE (INFECTION SUSPECTED)  CK    EKG None  Radiology CT Renal Stone Study Result Date: 05/21/2023 CLINICAL DATA:  58 year old male with abdomen pain, flank pain, trouble urinating, fever. EXAM: CT ABDOMEN AND PELVIS WITHOUT CONTRAST TECHNIQUE: Multidetector CT imaging of the abdomen and pelvis was performed following the standard protocol without IV contrast. RADIATION DOSE REDUCTION: This exam was performed according to the departmental dose-optimization program which includes automated exposure control, adjustment of the mA and/or kV according to patient size and/or use of iterative reconstruction technique. COMPARISON:  CT Abdomen and Pelvis 10/26/2013. FINDINGS: Lower chest: Normal heart size. Lung bases are well aerated. No pleural or pericardial effusion. Hepatobiliary: Cholecystectomy.  Negative noncontrast liver. Pancreas: Negative. Spleen: Negative. Adrenals/Urinary Tract: Chronic 15 mm right adrenal nodule measures 16 Hounsfield units and was present in 2015 consistent with benign adenoma (no follow up imaging  recommended). Left adrenal is normal. Kidneys appear stable, nonobstructed. No nephrolithiasis. No evidence of acute renal inflammation. 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 But decompressed. Both ureters also are decompressed to the bladder and normal. Diminutive and unremarkable bladder. Stomach/Bowel: No dilated large or small bowel. Mild to moderate sigmoid diverticulosis with no active inflammation identified. Appendix remains normal on coronal image 82. There are conspicuous, clustered right lower quadrant mesenteric lymph nodes individually measuring up to 9-10 mm (coronal images 73-76) which were present but are increased compared to 2015. But no regional mesenteric inflammation. Flocculated material in the terminal ileum which otherwise appears negative. No obvious right colon mass or abnormality. No upstream dilated small bowel. Noncontrast stomach and duodenum appear decompressed. No free air, free fluid, or mesenteric inflammation identified. Vascular/Lymphatic: Normal caliber abdominal aorta. Minimal calcified  plaque. Reproductive: Negative. Other: No pelvis free fluid. Musculoskeletal: Thoracic and lumbar spine degeneration. No acute or suspicious osseous lesion. IMPRESSION: 1. Conspicuous and indeterminate increased right lower quadrant mesenteric lymph nodes near the cecum, terminal ileum. The appendix remains normal. The cecum is opacified but without obvious mass or active inflammation. Recommend up-to-date Colon Cancer Screening such as endoscopy or virtual colonoscopy. 2. No other acute or inflammatory process identified in the noncontrast abdomen or pelvis. No urinary calculus or obstructive uropathy. Electronically Signed   By: Marlise Simpers M.D.   On: 05/21/2023 05:59   DG Chest Port 1 View Result Date: 05/21/2023 CLINICAL DATA:  58 year old male with history of fevers.  HIV. EXAM: PORTABLE CHEST 1 VIEW COMPARISON:  Chest x-ray 11/15/2019. FINDINGS: Lung volumes are  low. No consolidative airspace disease. No pleural effusions. No pneumothorax. No pulmonary nodule or mass noted. Pulmonary vasculature and the cardiomediastinal silhouette are within normal limits. IMPRESSION: 1. Low lung volumes without radiographic evidence of acute cardiopulmonary disease. Electronically Signed   By: Alexandria Angel M.D.   On: 05/21/2023 05:47    Procedures Procedures    Medications Ordered in ED Medications  ibuprofen  (ADVIL ) tablet 800 mg (800 mg Oral Given 05/21/23 0216)  fentaNYL  (SUBLIMAZE ) injection 50 mcg (50 mcg Intravenous Given 05/21/23 0353)  sodium chloride  0.9 % bolus 1,000 mL (1,000 mLs Intravenous New Bag/Given 05/21/23 0353)    ED Course/ Medical Decision Making/ A&P                                 Medical Decision Making Amount and/or Complexity of Data Reviewed Labs: ordered. Radiology: ordered and independent interpretation performed. ECG/medicine tests: ordered and independent interpretation performed.  Risk Prescription drug management.   58 year old male presenting to the ED for fever, nausea, vomiting, and diarrhea that began today around lunchtime.  He denies any sick contacts with similar.  Feels achy all over, worse in his back.  Febrile here but nontoxic in appearance.  He is not have any abdominal tenderness on exam, no peritoneal signs.  He is febrile and tachycardic but BP is stable.  He is given IV fluids, Motrin  for fever.  RVP was obtained from triage and is negative.  Further labs are pending.  Labs as above--minimal leukocytosis at 10.7.  Normal lactate.  No significant electrolyte derangement.  CK is normal. UA without signs of infection.  Chest x-ray is clear.  CT read still pending.  5:54 AM HR has downtrended with IVF and fever control, temp now 100.42F.  He is resting comfortably at present.  Has not had further nausea/vomiting here, tolerated PO.  6:06 AM CT with enlarged lymph nodes in RLQ but appendix remains normal in  appearance. He has no RLQ tenderness on my exam.  He has had prior colonoscopy which was normal per his report.  Question if this is in reactive from viral GI illness.  As work-up is largely reassuring, feel stable for discharge with symptomatic care.  Suspect likely viral etiology of symptoms.  Encouraged to push oral fluids, close follow-up with PCP.  Strict return precautions for any new/acute changes.  Final Clinical Impression(s) / ED Diagnoses Final diagnoses:  Nausea vomiting and diarrhea  Fever, unspecified fever cause    Rx / DC Orders ED Discharge Orders          Ordered    ondansetron  (ZOFRAN -ODT) 4 MG disintegrating tablet  Every 8 hours PRN  05/21/23 0609    dicyclomine  (BENTYL ) 20 MG tablet  2 times daily        05/21/23 1610              Coretha Dew, PA-C 05/21/23 9604    Alissa April, MD 05/21/23 [phone]

## 2023-05-21 NOTE — Discharge Instructions (Addendum)
 Take the prescribed medication as directed.  Can add in imodium or similar needed if diarrhea persists. Can continue tylenol  or motrin  as needed for fever. Bland diet for now, advance as tolerated. Follow-up with your primary care doctor. Return to the ED for new or worsening symptoms.

## 2023-05-26 LAB — CULTURE, BLOOD (ROUTINE X 2): Culture: NO GROWTH

## 2023-05-29 ENCOUNTER — Other Ambulatory Visit (HOSPITAL_COMMUNITY): Payer: Self-pay

## 2023-05-31 ENCOUNTER — Ambulatory Visit (INDEPENDENT_AMBULATORY_CARE_PROVIDER_SITE_OTHER): Payer: No Typology Code available for payment source | Admitting: Internal Medicine

## 2023-05-31 ENCOUNTER — Other Ambulatory Visit (HOSPITAL_COMMUNITY): Payer: Self-pay

## 2023-05-31 ENCOUNTER — Encounter: Payer: Self-pay | Admitting: Internal Medicine

## 2023-05-31 ENCOUNTER — Other Ambulatory Visit: Payer: Self-pay

## 2023-05-31 VITALS — BP 108/74 | HR 81 | Resp 16 | Ht 68.5 in | Wt 194.0 lb

## 2023-05-31 DIAGNOSIS — R590 Localized enlarged lymph nodes: Secondary | ICD-10-CM | POA: Diagnosis not present

## 2023-05-31 DIAGNOSIS — B20 Human immunodeficiency virus [HIV] disease: Secondary | ICD-10-CM

## 2023-05-31 DIAGNOSIS — R739 Hyperglycemia, unspecified: Secondary | ICD-10-CM

## 2023-05-31 MED ORDER — CABOTEGRAVIR & RILPIVIRINE ER 600 & 900 MG/3ML IM SUER
1.0000 | Freq: Once | INTRAMUSCULAR | Status: AC
  Administered 2023-05-31: 1 via INTRAMUSCULAR

## 2023-05-31 NOTE — Assessment & Plan Note (Signed)
He continues to do well on Cabenuva and no changes indicated.  Given today and he has follow-up in 2 months.  Previous labs reviewed with him and will recheck his labs today.

## 2023-05-31 NOTE — Assessment & Plan Note (Signed)
recent elevated blood sugar and will check hemoglobin A1c today

## 2023-05-31 NOTE — Assessment & Plan Note (Signed)
Some lymphadenopathy noted on a recent CT scan he had during his gastroenteritis.  Scan recommends to consider colonoscopy so he will call his gastroenterology office for follow-up.

## 2023-05-31 NOTE — Progress Notes (Signed)
   Subjective:    Patient ID: Curtis Trevino, male    DOB: Aug 31, 1965, 58 y.o.   MRN: [id number]  HPI Kenderick is here for follow-up of HIV. He continues onCabenuva has had no issues with that.  He was recently hospitalized for gastroenteritis and did have a CT scan that noted some lymphadenopathy with a mentation for potential colonoscopy.  He did have a colonoscopy previously and there were no issues.  He otherwise is having no further stomach issues.  He did have a minimally elevated WBC count at that time.  His sugar also was up a bit.  He has not been to see his primary care doctor recently.   Review of Systems  Constitutional:  Negative for fatigue.  Gastrointestinal:  Negative for diarrhea and nausea.       Objective:   Physical Exam Eyes:     General: No scleral icterus. Pulmonary:     Effort: Pulmonary effort is normal.  Neurological:     Mental Status: He is alert.   SH: no tobacco        Assessment & Plan:

## 2023-06-01 LAB — T-HELPER CELL (CD4) - (RCID CLINIC ONLY)
CD4 % Helper T Cell: 37 % (ref 33–65)
CD4 T Cell Abs: 794 /uL (ref 400–1790)

## 2023-06-03 LAB — HEMOGLOBIN A1C
Hgb A1c MFr Bld: 6 %{Hb} — ABNORMAL HIGH (ref <5.7)
Mean Plasma Glucose: 126 mg/dL
eAG (mmol/L): 7 mmol/L

## 2023-06-03 LAB — HIV-1 RNA QUANT-NO REFLEX-BLD
HIV 1 RNA Quant: NOT DETECTED {copies}/mL
HIV-1 RNA Quant, Log: NOT DETECTED {Log}

## 2023-06-05 ENCOUNTER — Other Ambulatory Visit (HOSPITAL_COMMUNITY): Payer: Self-pay

## 2023-07-25 NOTE — Progress Notes (Unsigned)
 HPI: Curtis Trevino is a 58 y.o. male who presents to the [facility] pharmacy clinic for [place] administration.  Patient Active Problem List   Diagnosis Date Noted   Lymphadenopathy, mesenteric 05/31/2023   Encounter for long-term (current) use of high-risk medication 12/07/2022   Diarrhea 03/29/2022   Routine screening for STI (sexually transmitted infection) 11/29/2021   COVID 04/26/2021   Migraine 09/01/2020   Therapeutic drug monitoring 07/20/2020   Rectal bleeding 07/20/2020   HIV disease (HCC) 02/16/2020   Healthcare maintenance 02/16/2020   Immunization counseling 02/16/2020   Syphilis 02/16/2020   High risk sexual behavior 02/16/2020   Polyarthralgia 04/23/2019   Hyperglycemia 04/23/2019   Prediabetes 04/23/2019   Chronic pain of both knees 03/24/2019   Bilateral hand pain 03/24/2019   Pain of great toe, right 03/24/2019    Patient's Medications  New Prescriptions   No medications on file  Previous Medications   ASPIRIN-ACETAMINOPHEN-CAFFEINE (EXCEDRIN MIGRAINE) 250-250-65 MG TABLET    Take by mouth every 6 (six) hours as needed for headache.   CABOTEGRAVIR & RILPIVIRINE ER (CABENUVA) 600 & 900 MG/3ML INJECTION    Inject 1 kit into the muscle every 2 (two) months.   CYCLOBENZAPRINE (FLEXERIL) 10 MG TABLET    Take 1 tablet (10 mg total) by mouth 2 (two) times daily as needed for muscle spasms.   DICLOFENAC SODIUM (VOLTAREN) 1 % GEL    Apply 4 g topically 4 (four) times daily.   DICYCLOMINE (BENTYL) 20 MG TABLET    Take 1 tablet (20 mg total) by mouth 2 (two) times daily.   DULOXETINE (CYMBALTA) 20 MG CAPSULE    Take 1 capsule (20 mg total) by mouth daily.   FLUTICASONE (FLONASE) 50 MCG/ACT NASAL SPRAY    Place 2 sprays into both nostrils daily.   IBUPROFEN (ADVIL) 600 MG TABLET    Take 1 tablet (600 mg total) by mouth every 6 (six) hours as needed.   ONDANSETRON (ZOFRAN-ODT) 4 MG DISINTEGRATING TABLET    Dissolve 1 tablet (4 mg total) by mouth every 8 (eight) hours as  needed for nausea.   ROSUVASTATIN (CRESTOR) 10 MG TABLET    Take 1 tablet (10 mg total) by mouth daily.   TOPIRAMATE (TOPAMAX) 25 MG TABLET    Take 1 tablet by mouth twice daily  Modified Medications   No medications on file  Discontinued Medications   No medications on file    Allergies: Allergies  Allergen Reactions   Codeine Hives, Itching and Rash   Tylenol [Acetaminophen] Nausea And Vomiting    Vomiting every time he takes tylenol    Labs: Lab Results  Component Value Date   HIV1RNAQUANT Not Detected 05/31/2023   HIV1RNAQUANT Not Detected 12/07/2022   HIV1RNAQUANT Not Detected 07/27/2022   CD4TABS 794 05/31/2023   CD4TABS 750 12/07/2022   CD4TABS 861 03/29/2022    RPR and STI Lab Results  Component Value Date   LABRPR REACTIVE (A) 12/07/2022   LABRPR REACTIVE (A) 03/29/2022   LABRPR REACTIVE (A) 11/29/2021   LABRPR REACTIVE (A) 08/02/2021   LABRPR REACTIVE (A) 10/27/2020   RPRTITER 1:2 (H) 12/07/2022   RPRTITER 1:2 (H) 03/29/2022   RPRTITER 1:1 (H) 11/29/2021   RPRTITER 1:2 (H) 08/02/2021   RPRTITER 1:2 (H) 10/27/2020    STI Results GC CT  12/07/2022  9:28 AM Negative  Negative   08/02/2021  3:30 PM Negative  Negative   10/27/2020  9:07 AM Negative    Negative    Negative  Negative    Positive    Negative   06/08/2020  3:29 PM Negative    Negative    Negative  Negative    Negative    Negative   02/16/2020  9:51 AM Negative  Negative   02/16/2020  9:20 AM Negative  Negative   01/30/2020  9:34 AM Negative  Negative   06/18/2019 10:32 AM Positive    Positive    Negative  Negative    Negative    Negative   06/13/2018 12:00 AM Negative    Negative    Negative  Negative    Negative    Negative   03/18/2018 12:00 AM **POSITIVE**    Negative    Negative  **POSITIVE**    Negative    Negative   09/18/2017 12:00 AM Negative    Negative    Negative  Negative    Negative    Negative   08/23/2017 12:00 AM **POSITIVE**    Negative    Negative   Negative    Negative    Negative     Hepatitis B Lab Results  Component Value Date   HEPBSAB REACTIVE (A) 07/20/2020   HEPBSAG NON-REACTIVE 07/20/2020   HEPBCAB NON-REACTIVE 07/20/2020   Hepatitis C Lab Results  Component Value Date   HEPCAB NON-REACTIVE 01/30/2020   Hepatitis A Lab Results  Component Value Date   HAV REACTIVE (A) 01/30/2020   Lipids: Lab Results  Component Value Date   CHOL 167 12/07/2022   TRIG 161 (H) 12/07/2022   HDL 49 12/07/2022   CHOLHDL 3.4 12/07/2022   LDLCALC 92 12/07/2022    TARGET DATE: 23rd  Assessment: Curtis Trevino presents today for 2 month maintenance Cabenuva injections. Past injections were tolerated well without issues. Last HIV RNA was undetectable in 05/2023. Doing well with no issues today.  Administered cabotegravir 600mg /52mL in left upper outer quadrant of the gluteal muscle. Administered rilpivirine 900 mg/3mL in the right upper outer quadrant of the gluteal muscle. No issues with injections. Curtis Trevino will follow up in 2 months for next set of injections.  Immunizations: eligible for the shingles and tdap vaccines. He is agreeable to receive the shingles vaccine today.  Plan: - Shingrix administered to left deltoid - Cabenuva injections administered - Next injections scheduled for 6/19 with Curtis Trevino and 8/21 with Dr. Zelda Trevino - Call with any issues or questions  Curtis Trevino, PharmD PGY1 Acute Care Pharmacy Resident  07/26/2023 9:58 AM

## 2023-07-26 ENCOUNTER — Other Ambulatory Visit: Payer: Self-pay

## 2023-07-26 ENCOUNTER — Ambulatory Visit: Payer: Self-pay | Admitting: Pharmacist

## 2023-07-26 DIAGNOSIS — Z23 Encounter for immunization: Secondary | ICD-10-CM

## 2023-07-26 DIAGNOSIS — B20 Human immunodeficiency virus [HIV] disease: Secondary | ICD-10-CM

## 2023-07-26 MED ORDER — CABOTEGRAVIR & RILPIVIRINE ER 600 & 900 MG/3ML IM SUER
1.0000 | Freq: Once | INTRAMUSCULAR | Status: AC
  Administered 2023-07-26: 1 via INTRAMUSCULAR

## 2023-08-06 ENCOUNTER — Other Ambulatory Visit (HOSPITAL_COMMUNITY): Payer: Self-pay

## 2023-08-16 ENCOUNTER — Ambulatory Visit (INDEPENDENT_AMBULATORY_CARE_PROVIDER_SITE_OTHER): Admitting: Family Medicine

## 2023-08-16 ENCOUNTER — Other Ambulatory Visit (HOSPITAL_COMMUNITY): Payer: Self-pay | Admitting: Emergency Medicine

## 2023-08-16 VITALS — BP 115/90 | HR 89 | Temp 99.0°F | Ht 68.5 in | Wt 188.8 lb

## 2023-08-16 DIAGNOSIS — J329 Chronic sinusitis, unspecified: Secondary | ICD-10-CM | POA: Diagnosis not present

## 2023-08-16 MED ORDER — AMOXICILLIN-POT CLAVULANATE 875-125 MG PO TABS: 1.0000 | ORAL_TABLET | Freq: Two times a day (BID) | ORAL | 0 refills | Status: AC

## 2023-08-16 NOTE — Progress Notes (Signed)
    SUBJECTIVE:   CHIEF COMPLAINT / HPI:   30 days ago had URI for about 6 days That improved with supportive care Last 7 days had recurrent symptoms - congestion, cough worse at night Has tried netty pot, flonase , allegra d On HIV med Cabenuva , follows with ID  Using ibuprofen  and excedrin, took this morning Doesn't think he had fevers but endorses some myalgias Denies N/V/D, SOB   PERTINENT  PMH / PSH: HIV  OBJECTIVE:   BP (!) 116/90   Pulse 89   Temp 99 F (37.2 C)   Ht 5' 8.5" (1.74 m)   Wt 188 lb 12.8 oz (85.6 kg)   SpO2 98%   BMI 28.29 kg/m   General: NAD, pleasant, able to participate in exam HEENT: No significant tenderness to palpation of frontal and maxillary sinuses b/l. Posterior oropharynx with some signs of inflammation, without tonsillar exudate/erythema or swelling Cardiac: RRR, no murmurs auscultated Respiratory: CTAB, normal WOB Abdomen: soft, non-tender, non-distended, normoactive bowel sounds Extremities: warm and well perfused, no edema or cyanosis Skin: warm and dry, no rashes noted Neuro: alert, no obvious focal deficits, speech normal Psych: Normal affect and mood  ASSESSMENT/PLAN:   Assessment & Plan Sinusitis, unspecified chronicity, unspecified location Given recurrence of symptoms after initial illness will treat with augmentin  BID x 7 days for suspected bacterial sinusitis Possibly masking fever today given use of antipyretics this morning Otherwise continue current OTC medications and supportive care for congestion  Discussed return precautions   [name], MD [facility]

## 2023-08-16 NOTE — Patient Instructions (Addendum)
 Take Augmentin  twice daily for 7 days  Please let us  know if symptoms worsen

## 2023-08-17 ENCOUNTER — Other Ambulatory Visit: Payer: Self-pay | Admitting: Student

## 2023-08-17 ENCOUNTER — Other Ambulatory Visit (HOSPITAL_COMMUNITY): Payer: Self-pay

## 2023-08-20 ENCOUNTER — Other Ambulatory Visit (HOSPITAL_COMMUNITY): Payer: Self-pay

## 2023-08-27 NOTE — Progress Notes (Signed)
 The 10-year ASCVD risk score (Arnett DK, et al., 2019) is: 4.2%   Values used to calculate the score:     Age: 58 years     Sex: Male     Is Non-Hispanic African American: No     Diabetic: No     Tobacco smoker: No     Systolic Blood Pressure: 115 mmHg     Is BP treated: No     HDL Cholesterol: 49 mg/dL     Total Cholesterol: 167 mg/dL  Currently prescribed rosuvastatin  10 mg.   Sabri Teal, BSN, RN

## 2023-09-18 ENCOUNTER — Encounter: Payer: Self-pay | Admitting: *Deleted

## 2023-09-27 ENCOUNTER — Ambulatory Visit: Admitting: Pharmacist

## 2023-09-27 NOTE — Progress Notes (Signed)
 HPI: Curtis Trevino is a 58 y.o. male who presents to the [facility] pharmacy clinic for Cabenuva  administration.  Patient Active Problem List   Diagnosis Date Noted   Lymphadenopathy, mesenteric 05/31/2023   Encounter for long-term (current) use of high-risk medication 12/07/2022   Diarrhea 03/29/2022   Routine screening for STI (sexually transmitted infection) 11/29/2021   COVID 04/26/2021   Migraine 09/01/2020   Therapeutic drug monitoring 07/20/2020   Rectal bleeding 07/20/2020   HIV disease (HCC) 02/16/2020   Healthcare maintenance 02/16/2020   Immunization counseling 02/16/2020   Syphilis 02/16/2020   High risk sexual behavior 02/16/2020   Polyarthralgia 04/23/2019   Hyperglycemia 04/23/2019   Prediabetes 04/23/2019   Chronic pain of both knees 03/24/2019   Bilateral hand pain 03/24/2019   Pain of great toe, right 03/24/2019    Patient's Medications  New Prescriptions   No medications on file  Previous Medications   ASPIRIN-ACETAMINOPHEN -CAFFEINE (EXCEDRIN MIGRAINE) 250-250-65 MG TABLET    Take by mouth every 6 (six) hours as needed for headache.   CABOTEGRAVIR  & RILPIVIRINE  ER (CABENUVA ) 600 & 900 MG/3ML INJECTION    Inject 1 kit into the muscle every 2 (two) months.   CYCLOBENZAPRINE  (FLEXERIL ) 10 MG TABLET    Take 1 tablet (10 mg total) by mouth 2 (two) times daily as needed for muscle spasms.   DICLOFENAC  SODIUM (VOLTAREN ) 1 % GEL    Apply 4 g topically 4 (four) times daily.   DICYCLOMINE  (BENTYL ) 20 MG TABLET    Take 1 tablet (20 mg total) by mouth 2 (two) times daily.   DULOXETINE  (CYMBALTA ) 20 MG CAPSULE    Take 1 capsule (20 mg total) by mouth daily.   FLUTICASONE  (FLONASE ) 50 MCG/ACT NASAL SPRAY    Place 2 sprays into both nostrils daily.   IBUPROFEN  (ADVIL ) 600 MG TABLET    Take 1 tablet (600 mg total) by mouth every 6 (six) hours as needed.   ONDANSETRON  (ZOFRAN -ODT) 4 MG DISINTEGRATING TABLET    Dissolve 1 tablet (4 mg total) by mouth every 8 (eight) hours as  needed for nausea.   ROSUVASTATIN  (CRESTOR ) 10 MG TABLET    Take 1 tablet (10 mg total) by mouth daily.   TOPIRAMATE  (TOPAMAX ) 25 MG TABLET    Take 1 tablet by mouth twice daily  Modified Medications   No medications on file  Discontinued Medications   No medications on file    Allergies: Allergies  Allergen Reactions   Codeine Hives, Itching and Rash   Tylenol  [Acetaminophen ] Nausea And Vomiting    Vomiting every time he takes tylenol     Past Medical History: Past Medical History:  Diagnosis Date   Acid reflux    Chronic back pain     Social History: Social History   Socioeconomic History   Marital status: Single    Spouse name: Not on file   Number of children: Not on file   Years of education: Not on file   Highest education level: Not on file  Occupational History   Not on file  Tobacco Use   Smoking status: Former    Current packs/day: 0.00    Types: Cigarettes    Quit date: 11/15/2010    Years since quitting: 12.8   Smokeless tobacco: Never  Substance and Sexual Activity   Alcohol use: Not Currently    Alcohol/week: 3.0 standard drinks of alcohol    Types: 3 Shots of liquor per week    Comment: 3 drink a day  Drug use: No   Sexual activity: Yes    Partners: Male    Comment: declined condoms  Other Topics Concern   Not on file  Social History Narrative   Not on file   Social Drivers of Health   Financial Resource Strain: Not on file  Food Insecurity: Not on file  Transportation Needs: Not on file  Physical Activity: Not on file  Stress: Not on file  Social Connections: Not on file    Labs: Lab Results  Component Value Date   HIV1RNAQUANT Not Detected 05/31/2023   HIV1RNAQUANT Not Detected 12/07/2022   HIV1RNAQUANT Not Detected 07/27/2022   CD4TABS 794 05/31/2023   CD4TABS 750 12/07/2022   CD4TABS 861 03/29/2022    RPR and STI Lab Results  Component Value Date   LABRPR REACTIVE (A) 12/07/2022   LABRPR REACTIVE (A) 03/29/2022    LABRPR REACTIVE (A) 11/29/2021   LABRPR REACTIVE (A) 08/02/2021   LABRPR REACTIVE (A) 10/27/2020   RPRTITER 1:2 (H) 12/07/2022   RPRTITER 1:2 (H) 03/29/2022   RPRTITER 1:1 (H) 11/29/2021   RPRTITER 1:2 (H) 08/02/2021   RPRTITER 1:2 (H) 10/27/2020    STI Results GC CT  12/07/2022  9:28 AM Negative  Negative   08/02/2021  3:30 PM Negative  Negative   10/27/2020  9:07 AM Negative    Negative    Negative  Negative    Positive    Negative   06/08/2020  3:29 PM Negative    Negative    Negative  Negative    Negative    Negative   02/16/2020  9:51 AM Negative  Negative   02/16/2020  9:20 AM Negative  Negative   01/30/2020  9:34 AM Negative  Negative   06/18/2019 10:32 AM Positive    Positive    Negative  Negative    Negative    Negative   06/13/2018 12:00 AM Negative    Negative    Negative  Negative    Negative    Negative   03/18/2018 12:00 AM **POSITIVE**    Negative    Negative  **POSITIVE**    Negative    Negative   09/18/2017 12:00 AM Negative    Negative    Negative  Negative    Negative    Negative   08/23/2017 12:00 AM **POSITIVE**    Negative    Negative  Negative    Negative    Negative     Hepatitis B Lab Results  Component Value Date   HEPBSAB REACTIVE (A) 07/20/2020   HEPBSAG NON-REACTIVE 07/20/2020   HEPBCAB NON-REACTIVE 07/20/2020   Hepatitis C Lab Results  Component Value Date   HEPCAB NON-REACTIVE 01/30/2020   Hepatitis A Lab Results  Component Value Date   HAV REACTIVE (A) 01/30/2020   Lipids: Lab Results  Component Value Date   CHOL 167 12/07/2022   TRIG 161 (H) 12/07/2022   HDL 49 12/07/2022   CHOLHDL 3.4 12/07/2022   LDLCALC 92 12/07/2022    TARGET DATE:  The 23rd of the month  Assessment: Curtis Trevino presents today for their maintenance Cabenuva  injections. Initial/past injections were tolerated well without issues. No problems with systemic effects of injections.   Administered cabotegravir  600mg /32mL in left upper outer  quadrant of the gluteal muscle. Administered rilpivirine  900 mg/3mL in the right upper outer quadrant of the gluteal muscle. Monitored patient for 10 minutes after injection. Injections were tolerated well without issue. Patient will follow up in 2 months for next injection. Will check  HIV RNA along with lipid profile at next visit with Dr. Dennise.  Eligible for 2/2 Shingrix  vaccine today which he defers today.  Plan: - Cabenuva  injections administered  - Next injections scheduled for 8/21 with Dr. Dennise and 10/16 with me  - Call with any issues or questions  Alan Geralds, PharmD, CPP, BCIDP, AAHIVP Clinical Pharmacist Practitioner Infectious Diseases Clinical Pharmacist [facility]

## 2023-10-01 ENCOUNTER — Other Ambulatory Visit (HOSPITAL_COMMUNITY): Payer: Self-pay

## 2023-10-02 ENCOUNTER — Ambulatory Visit: Admitting: Pharmacist

## 2023-10-02 ENCOUNTER — Other Ambulatory Visit: Payer: Self-pay

## 2023-10-02 DIAGNOSIS — B20 Human immunodeficiency virus [HIV] disease: Secondary | ICD-10-CM

## 2023-10-02 MED ORDER — CABOTEGRAVIR & RILPIVIRINE ER 600 & 900 MG/3ML IM SUER
1.0000 | Freq: Once | INTRAMUSCULAR | Status: AC
  Administered 2023-10-02: 1 via INTRAMUSCULAR

## 2023-10-23 ENCOUNTER — Encounter: Payer: Self-pay | Admitting: Student

## 2023-10-23 ENCOUNTER — Ambulatory Visit (INDEPENDENT_AMBULATORY_CARE_PROVIDER_SITE_OTHER): Admitting: Student

## 2023-10-23 VITALS — BP 117/84 | HR 77 | Temp 98.1°F | Ht 68.5 in | Wt 190.0 lb

## 2023-10-23 DIAGNOSIS — R519 Headache, unspecified: Secondary | ICD-10-CM | POA: Diagnosis not present

## 2023-10-23 NOTE — Patient Instructions (Signed)
 It was great to see you! Thank you for allowing me to participate in your care!  I recommend that you always bring your medications to each appointment as this makes it easy to ensure we are on the correct medications and helps us  not miss when refills are needed.  Our plans for today:  - Headache Your recent symptoms and headache are abnormal and a little concerning. We recommend you go to the Emergency Department to be evaluated.   Take care and seek immediate care sooner if you develop any concerns.   Dr. Penne Rhein, MD [facility]

## 2023-10-23 NOTE — Progress Notes (Signed)
 SUBJECTIVE:   CHIEF COMPLAINT / HPI:   Sick Visit He has been experiencing a severe headache that began yesterday evening, described as feeling like 'somebody hit me with a two by four' at the back of his head, accompanied by a 'crunchy' sensation. The headache persisted through the night, causing significant discomfort and preventing sleep. Associated symptoms include lightheadedness, dizziness, and nausea.  He attempted to manage the headache with ibuprofen  and Excedrin, taking three ibuprofen  last night and three ibuprofen  with three Excedrin this morning, but found no relief. This headache is different from his usual migraines, which are throbbing and pulsating, typically relieved by sleep and medication. The current headache is dull and persistent, with tingling on the side of his head.  No fever, chills, runny nose, or cough. He reports no body aches. He denies any numbness, vomiting, or diarrhea, although he mentions having 'rocks' during a bowel movement this morning. No changes in symptoms with position changes, light, or sound.  PERTINENT  PMH / PSH: HIV  OBJECTIVE:  BP 117/84   Pulse 77   Temp 98.1 F (36.7 C) (Oral)   Ht 5' 8.5 (1.74 m)   Wt 190 lb (86.2 kg)   SpO2 100%   BMI 28.47 kg/m  Physical Exam Constitutional:      General: He is not in acute distress.    Appearance: He is ill-appearing.  Cardiovascular:     Rate and Rhythm: Normal rate and regular rhythm.     Pulses: Normal pulses.     Heart sounds: Normal heart sounds. No murmur heard.    No friction rub. No gallop.  Pulmonary:     Effort: Pulmonary effort is normal. No respiratory distress.     Breath sounds: Normal breath sounds. No stridor. No wheezing, rhonchi or rales.  Chest:     Chest wall: No tenderness.  Neurological:     Mental Status: He is alert.     Cranial Nerves: Cranial nerves 2-12 are intact. No cranial nerve deficit, dysarthria or facial asymmetry.     Sensory: Sensation is intact. No  sensory deficit.     Motor: Motor function is intact. No weakness, tremor or abnormal muscle tone.     Gait: Gait is intact.  Psychiatric:        Mood and Affect: Mood normal.        Behavior: Behavior normal.      ASSESSMENT/PLAN:   Assessment & Plan Acute intractable headache, unspecified headache type Patient comes in with concern of headache has been an issue for the last day.  Patient has history of HIV, follows with ID regularly was last seen in February. Taking Cabenuva .  Patient reports headache started yesterday, and back and had no history of trauma.  Patient also appreciates tenderness on side of head, pulsating sensation, and also loss of sensation on face.  Patient denies any fevers or systemic symptoms, denies any vomiting diarrhea, also appreciated some dizziness, but has normal neuroexam, dizziness not related to positional changes.  Headache not related to sound/light/activity.  Patient appreciates this does not feel like his regular migraines, seems worse, has not responded to ibuprofen /Excedrin.  Given concern for abnormal headache, history of HIV, will want patient to go to ED to be evaluated for possible concern of meningitis/ needing brain imaging versus lumbar puncture.  Will also recommend patient obtain COVID viral panel, as this reminds me last time he had COVID.  Patient feels fine to drive to ED, w/o medical concern requiring transportation. -  Patient sent to ED via self vehicle No follow-ups on file. Penne Rhein, MD 10/23/2023, 2:22 PM PGY-3, [facility]

## 2023-10-23 NOTE — Assessment & Plan Note (Addendum)
 Patient comes in with concern of headache has been an issue for the last day.  Patient has history of HIV, follows with ID regularly was last seen in February. Taking Cabenuva .  Patient reports headache started yesterday, and back and had no history of trauma.  Patient also appreciates tenderness on side of head, pulsating sensation, and also loss of sensation on face.  Patient denies any fevers or systemic symptoms, denies any vomiting diarrhea, also appreciated some dizziness, but has normal neuroexam, dizziness not related to positional changes.  Headache not related to sound/light/activity.  Patient appreciates this does not feel like his regular migraines, seems worse, has not responded to ibuprofen /Excedrin.  Given concern for abnormal headache, history of HIV, will want patient to go to ED to be evaluated for possible concern of meningitis/ needing brain imaging versus lumbar puncture.  Will also recommend patient obtain COVID viral panel, as this reminds me last time he had COVID.  Patient feels fine to drive to ED, w/o medical concern requiring transportation. - Patient sent to ED via self vehicle

## 2023-11-29 ENCOUNTER — Ambulatory Visit: Admitting: Internal Medicine

## 2023-11-29 ENCOUNTER — Other Ambulatory Visit (HOSPITAL_COMMUNITY)
Admission: RE | Admit: 2023-11-29 | Discharge: 2023-11-29 | Disposition: A | Discharge status: Home / Self Care | Source: Ambulatory Visit | Attending: Internal Medicine | Admitting: Internal Medicine

## 2023-11-29 ENCOUNTER — Other Ambulatory Visit: Payer: Self-pay

## 2023-11-29 ENCOUNTER — Other Ambulatory Visit (HOSPITAL_COMMUNITY): Payer: Self-pay

## 2023-11-29 ENCOUNTER — Encounter: Payer: Self-pay | Admitting: Internal Medicine

## 2023-11-29 VITALS — BP 108/73 | HR 77 | Temp 97.6°F | Ht 68.5 in | Wt 188.0 lb

## 2023-11-29 DIAGNOSIS — B20 Human immunodeficiency virus [HIV] disease: Secondary | ICD-10-CM

## 2023-11-29 DIAGNOSIS — Z23 Encounter for immunization: Secondary | ICD-10-CM

## 2023-11-29 MED ORDER — CABOTEGRAVIR & RILPIVIRINE ER 600 & 900 MG/3ML IM SUER
1.0000 | Freq: Once | INTRAMUSCULAR | Status: AC
  Administered 2023-11-29: 1 via INTRAMUSCULAR

## 2023-11-29 NOTE — Progress Notes (Signed)
 [facility]     HPI: Curtis Trevino is a 58 y.o. male presents for HIV management. No complaints with cab.  No new partner since last visit.     Reviewed:  HIV diagnosed: 01/2020 CD4 at diagnosis: 696 (43%) on 01/30/20 VL at diagnosis: 852 on 01/30/20 Prior ART: was previously on PrEP with [employer] Hx of OI: none Hx of STI: syphilis, GC, CT Risk factors: MSM   Genotype Hx: 01/2020 - Wild type   Past Medical History:  Diagnosis Date   Acid reflux    Chronic back pain     Past Surgical History:  Procedure Laterality Date   CHOLECYSTECTOMY N/A 11/18/2013   Procedure: LAPAROSCOPIC CHOLECYSTECTOMY;  Surgeon: Elsie GORMAN Holland, MD;  Location: [facility];  Service: General;  Laterality: N/A;   ESOPHAGOGASTRODUODENOSCOPY  2005   SCROTAL EXPLORATION Left 06/23/2016   Procedure: LEFT SCROTAL EXPLORATION WITH EXCISION OF EPIDIDYMAL MASS;  Surgeon: Norleen Seltzer, MD;  Location: [facility];  Service: Urology;  Laterality: Left;    Family History  Problem Relation Age of Onset   Diabetes Mother    Heart failure Mother    Cancer Mother    Heart failure Father    Cancer Father    Current Outpatient Medications on File Prior to Visit  Medication Sig Dispense Refill   aspirin-acetaminophen -caffeine (EXCEDRIN MIGRAINE) 250-250-65 MG tablet Take by mouth every 6 (six) hours as needed for headache.     cabotegravir  & rilpivirine  ER (CABENUVA ) 600 & 900 MG/3ML injection Inject 1 kit into the muscle every 2 (two) months. 6 mL 5   diclofenac  Sodium (VOLTAREN ) 1 % GEL Apply 4 g topically 4 (four) times daily. 400 g 0   fluticasone  (FLONASE ) 50 MCG/ACT nasal spray Place 2 sprays into both nostrils daily. 16 g 6   cyclobenzaprine  (FLEXERIL ) 10 MG tablet Take 1 tablet (10 mg total) by mouth 2 (two) times daily as needed for muscle spasms. 20 tablet 0   dicyclomine  (BENTYL ) 20 MG tablet Take 1 tablet (20 mg total) by mouth 2 (two) times daily. 20 tablet 0   DULoxetine   (CYMBALTA ) 20 MG capsule Take 1 capsule (20 mg total) by mouth daily. (Patient not taking: Reported on 12/07/2022) 30 capsule 3   ibuprofen  (ADVIL ) 600 MG tablet Take 1 tablet (600 mg total) by mouth every 6 (six) hours as needed. 30 tablet 0   ondansetron  (ZOFRAN -ODT) 4 MG disintegrating tablet Dissolve 1 tablet (4 mg total) by mouth every 8 (eight) hours as needed for nausea. 10 tablet 0   rosuvastatin  (CRESTOR ) 10 MG tablet Take 1 tablet (10 mg total) by mouth daily. (Patient not taking: Reported on 11/29/2023) 90 tablet 3   topiramate  (TOPAMAX ) 25 MG tablet Take 1 tablet by mouth twice daily (Patient not taking: Reported on 05/21/2023) 60 tablet 2   [DISCONTINUED] cetirizine  (ZYRTEC ) 10 MG tablet Take 1 tablet (10 mg total) by mouth daily. 30 tablet 0   [DISCONTINUED] omeprazole (PRILOSEC OTC) 20 MG tablet Take 20 mg by mouth at bedtime.      No current facility-administered medications on file prior to visit.    Allergies  Allergen Reactions   Codeine Hives, Itching and Rash   Tylenol  [Acetaminophen ] Nausea And Vomiting    Vomiting every time he takes tylenol       Lab Results HIV 1 RNA Quant (Copies/mL)  Date Value  05/31/2023 Not Detected  12/07/2022 Not Detected  07/27/2022 Not Detected  CD4 T Cell Abs (/uL)  Date Value  05/31/2023 794  12/07/2022 750  03/29/2022 861   No results found for: HIV1GENOSEQ Lab Results  Component Value Date   WBC 10.7 (H) 05/21/2023   HGB 13.4 05/21/2023   HCT 41.3 05/21/2023   MCV 85.7 05/21/2023   PLT 228 05/21/2023    Lab Results  Component Value Date   CREATININE 1.25 (H) 05/21/2023   BUN 20 05/21/2023   NA 136 05/21/2023   K 3.7 05/21/2023   CL 106 05/21/2023   CO2 20 (L) 05/21/2023   Lab Results  Component Value Date   ALT 23 05/21/2023   AST 25 05/21/2023   ALKPHOS 42 05/21/2023   BILITOT 0.5 05/21/2023    Lab Results  Component Value Date   CHOL 167 12/07/2022   TRIG 161 (H) 12/07/2022   HDL 49 12/07/2022    LDLCALC 92 12/07/2022   Lab Results  Component Value Date   HAV REACTIVE (A) 01/30/2020   Lab Results  Component Value Date   HEPBSAG NON-REACTIVE 07/20/2020   HEPBSAB REACTIVE (A) 07/20/2020   No results found for: HCVAB Lab Results  Component Value Date   CHLAMYDIAWP Negative 12/07/2022   N Negative 12/07/2022   No results found for: GCPROBEAPT No results found for: QUANTGOLD  Assessment/Plan #HIV -CD4 794, VLnd, on 05/2023 -Cab -F/Uin 6 months for hiv mainataince, q2 months   #LAD on CT -recommend colnosocpy #Vaccination COVID Flu Monkeypox PCV 23 2022 Meningitis 2022->needs 2nd dose today HepA immune HEpB immune Tdap Shingles  #Health maintenance -Quantiferon today -RPR 1:2 on 12/07/22, today -HCV negative 2021, today -GC urine negative on 12/07/22 -Lipid The 10-year ASCVD risk score (Arnett DK, et al., 2019) is: 3.8%   Values used to calculate the score:     Age: 85 years     Clincally relevant sex: Male     Is Non-Hispanic African American: No     Diabetic: No     Tobacco smoker: No     Systolic Blood Pressure: 108 mmHg     Is BP treated: No     HDL Cholesterol: 49 mg/dL     Total Cholesterol: 167 mg/dL On statin, plans to start -Dysplasia screen M-> anal pap -Colonoscopy->referral into gi    Loney Stank, MD [facility] I have personally spent 52 minutes involved in face-to-face and non-face-to-face activities for this patient on the day of the visit. Professional time spent includes the following activities: Preparing to see the patient (review of tests), Obtaining and/or reviewing separately obtained history (admission/discharge record), Performing a medically appropriate examination and/or evaluation , Ordering medications/tests/procedures, referring and communicating with other health care professionals, Documenting clinical information in the EMR, Independently interpreting results  (not separately reported), Communicating results to the patient/family/caregiver, Counseling and educating the patient/family/caregiver and Care coordination (not separately reported).

## 2023-11-30 LAB — URINE CYTOLOGY ANCILLARY ONLY
Chlamydia: NEGATIVE
Comment: NEGATIVE
Comment: NORMAL
Neisseria Gonorrhea: NEGATIVE

## 2023-11-30 LAB — T-HELPER CELLS (CD4) COUNT (NOT AT ARMC)
CD4 % Helper T Cell: 38 % (ref 33–65)
CD4 T Cell Abs: 805 /uL (ref 400–1790)

## 2023-12-03 LAB — RPR: RPR Ser Ql: REACTIVE — AB

## 2023-12-03 LAB — CYTOLOGY - PAP
Comment: NEGATIVE
Diagnosis: NEGATIVE
High risk HPV: NEGATIVE

## 2023-12-03 LAB — CBC WITH DIFFERENTIAL/PLATELET
Absolute Lymphocytes: 2202 {cells}/uL (ref 850–3900)
Absolute Monocytes: 561 {cells}/uL (ref 200–950)
Basophils Absolute: 104 {cells}/uL (ref 0–200)
Basophils Relative: 1.7 %
Eosinophils Absolute: 183 {cells}/uL (ref 15–500)
Eosinophils Relative: 3 %
HCT: 44.6 % (ref 38.5–50.0)
Hemoglobin: 14.6 g/dL (ref 13.2–17.1)
MCH: 28.1 pg (ref 27.0–33.0)
MCHC: 32.7 g/dL (ref 32.0–36.0)
MCV: 85.8 fL (ref 80.0–100.0)
MPV: 11.1 fL (ref 7.5–12.5)
Monocytes Relative: 9.2 %
Neutro Abs: 3050 {cells}/uL (ref 1500–7800)
Neutrophils Relative %: 50 %
Platelets: 253 Thousand/uL (ref 140–400)
RBC: 5.2 Million/uL (ref 4.20–5.80)
RDW: 14.1 % (ref 11.0–15.0)
Total Lymphocyte: 36.1 %
WBC: 6.1 Thousand/uL (ref 3.8–10.8)

## 2023-12-03 LAB — T PALLIDUM AB: T Pallidum Abs: POSITIVE — AB

## 2023-12-03 LAB — QUANTIFERON-TB GOLD PLUS
Mitogen-NIL: >10 [IU]/mL
NIL: 0.09 [IU]/mL
QuantiFERON-TB Gold Plus: NEGATIVE
TB1-NIL: 0.05 [IU]/mL
TB2-NIL: 0.08 [IU]/mL

## 2023-12-03 LAB — COMPLETE METABOLIC PANEL WITHOUT GFR
AG Ratio: 1.6 (calc) (ref 1.0–2.5)
ALT: 17 U/L (ref 9–46)
AST: 18 U/L (ref 10–35)
Albumin: 4.7 g/dL (ref 3.6–5.1)
Alkaline phosphatase (APISO): 45 U/L (ref 35–144)
BUN/Creatinine Ratio: 13 (calc) (ref 6–22)
BUN: 18 mg/dL (ref 7–25)
CO2: 25 mmol/L (ref 20–32)
Calcium: 9.6 mg/dL (ref 8.6–10.3)
Chloride: 105 mmol/L (ref 98–110)
Creat: 1.39 mg/dL — ABNORMAL HIGH (ref 0.70–1.30)
Globulin: 3 g/dL (ref 1.9–3.7)
Glucose, Bld: 126 mg/dL — ABNORMAL HIGH (ref 65–99)
Potassium: 4.3 mmol/L (ref 3.5–5.3)
Sodium: 139 mmol/L (ref 135–146)
Total Bilirubin: 0.3 mg/dL (ref 0.2–1.2)
Total Protein: 7.7 g/dL (ref 6.1–8.1)

## 2023-12-03 LAB — HEPATITIS C ANTIBODY: Hepatitis C Ab: NONREACTIVE

## 2023-12-03 LAB — HIV-1 RNA QUANT-NO REFLEX-BLD
HIV 1 RNA Quant: NOT DETECTED {copies}/mL
HIV-1 RNA Quant, Log: NOT DETECTED {Log_copies}/mL

## 2023-12-03 LAB — RPR TITER: RPR Titer: 1:1 {titer} — ABNORMAL HIGH

## 2024-01-10 ENCOUNTER — Ambulatory Visit: Admitting: Physician Assistant

## 2024-01-10 ENCOUNTER — Other Ambulatory Visit (INDEPENDENT_AMBULATORY_CARE_PROVIDER_SITE_OTHER): Payer: Self-pay

## 2024-01-10 DIAGNOSIS — M79671 Pain in right foot: Secondary | ICD-10-CM

## 2024-01-10 DIAGNOSIS — M79641 Pain in right hand: Secondary | ICD-10-CM | POA: Diagnosis not present

## 2024-01-10 DIAGNOSIS — M79642 Pain in left hand: Secondary | ICD-10-CM | POA: Diagnosis not present

## 2024-01-10 MED ORDER — MELOXICAM 7.5 MG PO TABS: 7.5000 mg | ORAL_TABLET | Freq: Two times a day (BID) | ORAL | 2 refills | Status: AC | PRN

## 2024-01-10 NOTE — Progress Notes (Signed)
 Office Visit Note   Patient: Curtis Trevino           Date of Birth: 1965-06-24           MRN: [id number] Visit Date: 01/10/2024              Requested by: Lafe Domino, DO [address] [place],  [place] [address] PCP: Lafe Domino, DO   Assessment & Plan: Visit Diagnoses:  1. Pain in right foot   2. 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  Regards to the hands, he is elected to try a different prescription anti-inflammatory for which have sent to his pharmacy.  He will continue to use topical NSAIDs as needed.  In regards to his great toe, I have recommended a carbon fiber insert.  We have also discussed possible cortisone injection or surgical intervention.  He will follow-up as needed.  Follow-Up Instructions: Return if symptoms worsen or fail to improve.   Orders:  Orders Placed This Encounter  Procedures   XR Foot Complete Right   Meds ordered this encounter  Medications   meloxicam  (MOBIC ) 7.5 MG tablet    Sig: Take 1 tablet (7.5 mg total) by mouth 2 (two) times daily as needed for up to 180 doses for pain.    Dispense:  60 tablet    Refill:  2      Procedures: No procedures performed   Clinical Data: No additional findings.   Subjective: Chief Complaint  Patient presents with   Right Hand - Pain   Right Foot - Pain    HPI patient is a pleasant 58 year old right-hand-dominant gentleman who comes in today with bilateral hand pain right greater than left as well as right great toe pain.  In regards to the hands, he has had diffuse pain primarily across the MCP joints and into the PIP joints of the index and long fingers for a while.  He notes that his symptoms have worsened over the past 6 to 8 months.  He denies having had an injury or change in activity but does note he works at [employer] and he is constantly using his hands.  He has associated morning stiffness and worsening symptoms  when he is using the computer or pricing going at work.  He has been using ibuprofen , Excedrin and Voltaren  gel without significant relief.  He denies any paresthesias to either hand.  He did have remote x-rays in epic that I reviewed which showed diffuse degenerative changes throughout both hands worse in the first [facility] joints.  In regards to his right foot, he is having pain primarily to the right great toe.  Symptoms began about 10 years ago after kicking a hard base.  The pain he has is worse with activity such as walking.  He has not tried wearing shoe inserts.  Review of Systems as detailed in HPI.  All others reviewed and are negative.   Objective: Vital Signs: There were no vitals taken for this visit.  Physical Exam well-developed well-nourished gentleman in no acute distress.  Alert and oriented x 3.  Ortho Exam bilateral hand exam: He does have mild swelling to the MCP joint and PIP joints which are also mildly tender.  No skin changes.  He has full range of motion of both hands in all 10 fingers.  He is neurovascularly intact distally.  Right foot exam: He does have a prominence to  the first MTP joint.  This is mildly tender.  He does have increased pain with dorsiflexion of the great toe.  He is neurovascularly intact distally.  Specialty Comments:  No specialty comments available.  Imaging: XR Foot Complete Right Result Date: 01/10/2024 X-rays demonstrate advanced degenerative changes to the first MTP joint.      PMFS History: Patient Active Problem List   Diagnosis Date Noted   Lymphadenopathy, mesenteric 05/31/2023   Encounter for long-term (current) use of high-risk medication 12/07/2022   Diarrhea 03/29/2022   Routine screening for STI (sexually transmitted infection) 11/29/2021   COVID 04/26/2021   Migraine 09/01/2020   Therapeutic drug monitoring 07/20/2020   Rectal bleeding 07/20/2020   Headache 04/21/2020   HIV disease (HCC) 02/16/2020   Healthcare maintenance  02/16/2020   Immunization counseling 02/16/2020   Syphilis 02/16/2020   High risk sexual behavior 02/16/2020   Polyarthralgia 04/23/2019   Hyperglycemia 04/23/2019   Prediabetes 04/23/2019   Chronic pain of both knees 03/24/2019   Bilateral hand pain 03/24/2019   Pain of great toe, right 03/24/2019   Past Medical History:  Diagnosis Date   Acid reflux    Chronic back pain     Family History  Problem Relation Age of Onset   Diabetes Mother    Heart failure Mother    Cancer Mother    Heart failure Father    Cancer Father     Past Surgical History:  Procedure Laterality Date   CHOLECYSTECTOMY N/A 11/18/2013   Procedure: LAPAROSCOPIC CHOLECYSTECTOMY;  Surgeon: Elsie GORMAN Holland, MD;  Location: [facility];  Service: General;  Laterality: N/A;   ESOPHAGOGASTRODUODENOSCOPY  2005   SCROTAL EXPLORATION Left 06/23/2016   Procedure: LEFT SCROTAL EXPLORATION WITH EXCISION OF EPIDIDYMAL MASS;  Surgeon: Norleen Seltzer, MD;  Location: [facility];  Service: Urology;  Laterality: Left;   Social History   Occupational History   Not on file  Tobacco Use   Smoking status: Former    Current packs/day: 0.00    Types: Cigarettes    Quit date: 11/15/2010    Years since quitting: 13.1   Smokeless tobacco: Never  Substance and Sexual Activity   Alcohol use: Not Currently    Alcohol/week: 3.0 standard drinks of alcohol    Types: 3 Shots of liquor per week    Comment: 3 drink a day   Drug use: No   Sexual activity: Yes    Partners: Male    Comment: declined condoms

## 2024-01-23 NOTE — Progress Notes (Unsigned)
 HPI: Curtis Trevino is a 58 y.o. male who presents to the [facility] pharmacy clinic for Cabenuva  administration.  Referring ID Physician: Dr. Dennise  Patient Active Problem List   Diagnosis Date Noted   Lymphadenopathy, mesenteric 05/31/2023   Encounter for long-term (current) use of high-risk medication 12/07/2022   Diarrhea 03/29/2022   Routine screening for STI (sexually transmitted infection) 11/29/2021   COVID 04/26/2021   Migraine 09/01/2020   Therapeutic drug monitoring 07/20/2020   Rectal bleeding 07/20/2020   Headache 04/21/2020   HIV disease (HCC) 02/16/2020   Healthcare maintenance 02/16/2020   Immunization counseling 02/16/2020   Syphilis 02/16/2020   High risk sexual behavior 02/16/2020   Polyarthralgia 04/23/2019   Hyperglycemia 04/23/2019   Prediabetes 04/23/2019   Chronic pain of both knees 03/24/2019   Bilateral hand pain 03/24/2019   Pain of great toe, right 03/24/2019    Patient's Medications  New Prescriptions   No medications on file  Previous Medications   ASPIRIN-ACETAMINOPHEN -CAFFEINE (EXCEDRIN MIGRAINE) 250-250-65 MG TABLET    Take by mouth every 6 (six) hours as needed for headache.   CABOTEGRAVIR  & RILPIVIRINE  ER (CABENUVA ) 600 & 900 MG/3ML INJECTION    Inject 1 kit into the muscle every 2 (two) months.   CYCLOBENZAPRINE  (FLEXERIL ) 10 MG TABLET    Take 1 tablet (10 mg total) by mouth 2 (two) times daily as needed for muscle spasms.   DICLOFENAC  SODIUM (VOLTAREN ) 1 % GEL    Apply 4 g topically 4 (four) times daily.   DICYCLOMINE  (BENTYL ) 20 MG TABLET    Take 1 tablet (20 mg total) by mouth 2 (two) times daily.   DULOXETINE  (CYMBALTA ) 20 MG CAPSULE    Take 1 capsule (20 mg total) by mouth daily.   FLUTICASONE  (FLONASE ) 50 MCG/ACT NASAL SPRAY    Place 2 sprays into both nostrils daily.   IBUPROFEN  (ADVIL ) 600 MG TABLET    Take 1 tablet (600 mg total) by mouth every 6 (six) hours as needed.   MELOXICAM  (MOBIC ) 7.5 MG TABLET    Take 1 tablet (7.5 mg  total) by mouth 2 (two) times daily as needed for up to 180 doses for pain.   ONDANSETRON  (ZOFRAN -ODT) 4 MG DISINTEGRATING TABLET    Dissolve 1 tablet (4 mg total) by mouth every 8 (eight) hours as needed for nausea.   ROSUVASTATIN  (CRESTOR ) 10 MG TABLET    Take 1 tablet (10 mg total) by mouth daily.   TOPIRAMATE  (TOPAMAX ) 25 MG TABLET    Take 1 tablet by mouth twice daily  Modified Medications   No medications on file  Discontinued Medications   No medications on file    Allergies: Allergies  Allergen Reactions   Codeine Hives, Itching and Rash   Tylenol  [Acetaminophen ] Nausea And Vomiting    Vomiting every time he takes tylenol     Past Medical History: Past Medical History:  Diagnosis Date   Acid reflux    Chronic back pain     Social History: Social History   Socioeconomic History   Marital status: Single    Spouse name: Not on file   Number of children: Not on file   Years of education: Not on file   Highest education level: Not on file  Occupational History   Not on file  Tobacco Use   Smoking status: Former    Current packs/day: 0.00    Types: Cigarettes    Quit date: 11/15/2010    Years since quitting: 13.1  Smokeless tobacco: Never  Substance and Sexual Activity   Alcohol use: Not Currently    Alcohol/week: 3.0 standard drinks of alcohol    Types: 3 Shots of liquor per week    Comment: 3 drink a day   Drug use: No   Sexual activity: Yes    Partners: Male    Comment: declined condoms  Other Topics Concern   Not on file  Social History Narrative   Not on file   Social Drivers of Health   Financial Resource Strain: Not on file  Food Insecurity: Not on file  Transportation Needs: Not on file  Physical Activity: Not on file  Stress: Not on file  Social Connections: Not on file    Labs: Lab Results  Component Value Date   HIV1RNAQUANT NOT DETECTED 11/29/2023   HIV1RNAQUANT Not Detected 05/31/2023   HIV1RNAQUANT Not Detected 12/07/2022    CD4TABS 805 11/29/2023   CD4TABS 794 05/31/2023   CD4TABS 750 12/07/2022    RPR and STI Lab Results  Component Value Date   LABRPR REACTIVE (A) 11/29/2023   LABRPR REACTIVE (A) 12/07/2022   LABRPR REACTIVE (A) 03/29/2022   LABRPR REACTIVE (A) 11/29/2021   LABRPR REACTIVE (A) 08/02/2021   RPRTITER 1:1 (H) 11/29/2023   RPRTITER 1:2 (H) 12/07/2022   RPRTITER 1:2 (H) 03/29/2022   RPRTITER 1:1 (H) 11/29/2021   RPRTITER 1:2 (H) 08/02/2021    STI Results GC CT  11/29/2023  9:36 AM Negative  Negative   12/07/2022  9:28 AM Negative  Negative   08/02/2021  3:30 PM Negative  Negative   10/27/2020  9:07 AM Negative    Negative    Negative  Negative    Positive    Negative   06/08/2020  3:29 PM Negative    Negative    Negative  Negative    Negative    Negative   02/16/2020  9:51 AM Negative  Negative   02/16/2020  9:20 AM Negative  Negative   01/30/2020  9:34 AM Negative  Negative   06/18/2019 10:32 AM Positive    Positive    Negative  Negative    Negative    Negative   06/13/2018 12:00 AM Negative    Negative    Negative  Negative    Negative    Negative   03/18/2018 12:00 AM **POSITIVE**    Negative    Negative  **POSITIVE**    Negative    Negative   09/18/2017 12:00 AM Negative    Negative    Negative  Negative    Negative    Negative   08/23/2017 12:00 AM **POSITIVE**    Negative    Negative  Negative    Negative    Negative     Hepatitis B Lab Results  Component Value Date   HEPBSAB REACTIVE (A) 07/20/2020   HEPBSAG NON-REACTIVE 07/20/2020   HEPBCAB NON-REACTIVE 07/20/2020   Hepatitis C Lab Results  Component Value Date   HEPCAB NON-REACTIVE 11/29/2023   Hepatitis A Lab Results  Component Value Date   HAV REACTIVE (A) 01/30/2020   Lipids: Lab Results  Component Value Date   CHOL 167 12/07/2022   TRIG 161 (H) 12/07/2022   HDL 49 12/07/2022   CHOLHDL 3.4 12/07/2022   LDLCALC 92 12/07/2022    TARGET DATE:  The 23rd of the  month  Assessment: Beckam presents today for their maintenance Cabenuva  injections. Initial/past injections were tolerated well without issues. No problems with systemic effects of injections.  Administered cabotegravir  600mg /37mL in left upper outer quadrant of the gluteal muscle. Administered rilpivirine  900 mg/3mL in the right upper outer quadrant of the gluteal muscle. Monitored patient for 10 minutes after injection. Injections were tolerated well without issue. Patient will follow up in 2 months for next injection.  Eligible for 2/2 Shingrix , flu, and COVID vaccines; ***.   Plan: - Cabenuva  injections administered - Next injections scheduled for *** with me  - Call with any issues or questions  Alan Geralds, PharmD, CPP, BCIDP, AAHIVP Clinical Pharmacist Practitioner Infectious Diseases Clinical Pharmacist [facility]

## 2024-01-24 ENCOUNTER — Other Ambulatory Visit: Payer: Self-pay

## 2024-01-24 ENCOUNTER — Ambulatory Visit: Admitting: Pharmacist

## 2024-01-24 ENCOUNTER — Ambulatory Visit (INDEPENDENT_AMBULATORY_CARE_PROVIDER_SITE_OTHER): Admitting: Pharmacist

## 2024-01-24 DIAGNOSIS — B20 Human immunodeficiency virus [HIV] disease: Secondary | ICD-10-CM

## 2024-01-24 MED ORDER — CABOTEGRAVIR & RILPIVIRINE ER 600 & 900 MG/3ML IM SUER
1.0000 | Freq: Once | INTRAMUSCULAR | Status: AC
  Administered 2024-01-24: 1 via INTRAMUSCULAR

## 2024-01-28 ENCOUNTER — Other Ambulatory Visit (HOSPITAL_COMMUNITY): Payer: Self-pay

## 2024-02-11 ENCOUNTER — Encounter: Payer: Self-pay | Admitting: Radiology

## 2024-02-27 ENCOUNTER — Encounter: Payer: Self-pay | Admitting: Internal Medicine

## 2024-03-05 ENCOUNTER — Other Ambulatory Visit (HOSPITAL_COMMUNITY): Payer: Self-pay

## 2024-03-24 NOTE — Progress Notes (Unsigned)
 HPI: Curtis Trevino is a 58 y.o. male who presents to the [facility] pharmacy clinic for Cabenuva  administration.  Referring ID Physician: Dr. Dennise  Patient Active Problem List   Diagnosis Date Noted   Lymphadenopathy, mesenteric 05/31/2023   Encounter for long-term (current) use of high-risk medication 12/07/2022   Diarrhea 03/29/2022   Routine screening for STI (sexually transmitted infection) 11/29/2021   COVID 04/26/2021   Migraine 09/01/2020   Therapeutic drug monitoring 07/20/2020   Rectal bleeding 07/20/2020   Headache 04/21/2020   HIV disease (HCC) 02/16/2020   Healthcare maintenance 02/16/2020   Immunization counseling 02/16/2020   Syphilis 02/16/2020   High risk sexual behavior 02/16/2020   Polyarthralgia 04/23/2019   Hyperglycemia 04/23/2019   Prediabetes 04/23/2019   Chronic pain of both knees 03/24/2019   Bilateral hand pain 03/24/2019   Pain of great toe, right 03/24/2019    Patient's Medications  New Prescriptions   No medications on file  Previous Medications   ASPIRIN-ACETAMINOPHEN -CAFFEINE (EXCEDRIN MIGRAINE) 250-250-65 MG TABLET    Take by mouth every 6 (six) hours as needed for headache.   CABOTEGRAVIR  & RILPIVIRINE  ER (CABENUVA ) 600 & 900 MG/3ML INJECTION    Inject 1 kit into the muscle every 2 (two) months.   CYCLOBENZAPRINE  (FLEXERIL ) 10 MG TABLET    Take 1 tablet (10 mg total) by mouth 2 (two) times daily as needed for muscle spasms.   DICLOFENAC  SODIUM (VOLTAREN ) 1 % GEL    Apply 4 g topically 4 (four) times daily.   DICYCLOMINE  (BENTYL ) 20 MG TABLET    Take 1 tablet (20 mg total) by mouth 2 (two) times daily.   DULOXETINE  (CYMBALTA ) 20 MG CAPSULE    Take 1 capsule (20 mg total) by mouth daily.   FLUTICASONE  (FLONASE ) 50 MCG/ACT NASAL SPRAY    Place 2 sprays into both nostrils daily.   IBUPROFEN  (ADVIL ) 600 MG TABLET    Take 1 tablet (600 mg total) by mouth every 6 (six) hours as needed.   MELOXICAM  (MOBIC ) 7.5 MG TABLET    Take 1 tablet (7.5 mg  total) by mouth 2 (two) times daily as needed for up to 180 doses for pain.   ONDANSETRON  (ZOFRAN -ODT) 4 MG DISINTEGRATING TABLET    Dissolve 1 tablet (4 mg total) by mouth every 8 (eight) hours as needed for nausea.   ROSUVASTATIN  (CRESTOR ) 10 MG TABLET    Take 1 tablet (10 mg total) by mouth daily.   TOPIRAMATE  (TOPAMAX ) 25 MG TABLET    Take 1 tablet by mouth twice daily  Modified Medications   No medications on file  Discontinued Medications   No medications on file    Allergies: Allergies[1]  Past Medical History: Past Medical History:  Diagnosis Date   Acid reflux    Chronic back pain     Social History: Social History   Socioeconomic History   Marital status: Single    Spouse name: Not on file   Number of children: Not on file   Years of education: Not on file   Highest education level: Not on file  Occupational History   Not on file  Tobacco Use   Smoking status: Former    Current packs/day: 0.00    Types: Cigarettes    Quit date: 11/15/2010    Years since quitting: 13.3   Smokeless tobacco: Never  Substance and Sexual Activity   Alcohol use: Not Currently    Alcohol/week: 3.0 standard drinks of alcohol    Types: 3  Shots of liquor per week    Comment: 3 drink a day   Drug use: No   Sexual activity: Yes    Partners: Male    Comment: declined condoms  Other Topics Concern   Not on file  Social History Narrative   Not on file   Social Drivers of Health   Tobacco Use: Medium Risk (11/29/2023)   Patient History    Smoking Tobacco Use: Former    Smokeless Tobacco Use: Never    Passive Exposure: Not on file  Financial Resource Strain: Not on file  Food Insecurity: Not on file  Transportation Needs: Not on file  Physical Activity: Not on file  Stress: Not on file  Social Connections: Not on file  Depression (PHQ2-9): Low Risk (11/29/2023)   Depression (PHQ2-9)    PHQ-2 Score: 0  Alcohol Screen: Not on file  Housing: Not on file  Utilities: Not on file   Health Literacy: Not on file    Labs: Lab Results  Component Value Date   HIV1RNAQUANT NOT DETECTED 11/29/2023   HIV1RNAQUANT Not Detected 05/31/2023   HIV1RNAQUANT Not Detected 12/07/2022   CD4TABS 805 11/29/2023   CD4TABS 794 05/31/2023   CD4TABS 750 12/07/2022    RPR and STI Lab Results  Component Value Date   LABRPR REACTIVE (A) 11/29/2023   LABRPR REACTIVE (A) 12/07/2022   LABRPR REACTIVE (A) 03/29/2022   LABRPR REACTIVE (A) 11/29/2021   LABRPR REACTIVE (A) 08/02/2021   RPRTITER 1:1 (H) 11/29/2023   RPRTITER 1:2 (H) 12/07/2022   RPRTITER 1:2 (H) 03/29/2022   RPRTITER 1:1 (H) 11/29/2021   RPRTITER 1:2 (H) 08/02/2021    STI Results GC CT  11/29/2023  9:36 AM Negative  Negative   12/07/2022  9:28 AM Negative  Negative   08/02/2021  3:30 PM Negative  Negative   10/27/2020  9:07 AM Negative    Negative    Negative  Negative    Positive    Negative   06/08/2020  3:29 PM Negative    Negative    Negative  Negative    Negative    Negative   02/16/2020  9:51 AM Negative  Negative   02/16/2020  9:20 AM Negative  Negative   01/30/2020  9:34 AM Negative  Negative   06/18/2019 10:32 AM Positive    Positive    Negative  Negative    Negative    Negative   06/13/2018 12:00 AM Negative    Negative    Negative  Negative    Negative    Negative   03/18/2018 12:00 AM **POSITIVE**    Negative    Negative  **POSITIVE**    Negative    Negative   09/18/2017 12:00 AM Negative    Negative    Negative  Negative    Negative    Negative   08/23/2017 12:00 AM **POSITIVE**    Negative    Negative  Negative    Negative    Negative     Hepatitis B Lab Results  Component Value Date   HEPBSAB REACTIVE (A) 07/20/2020   HEPBSAG NON-REACTIVE 07/20/2020   HEPBCAB NON-REACTIVE 07/20/2020   Hepatitis C Lab Results  Component Value Date   HEPCAB NON-REACTIVE 11/29/2023   Hepatitis A Lab Results  Component Value Date   HAV REACTIVE (A) 01/30/2020   Lipids: Lab  Results  Component Value Date   CHOL 167 12/07/2022   TRIG 161 (H) 12/07/2022   HDL 49 12/07/2022   CHOLHDL 3.4  12/07/2022   LDLCALC 92 12/07/2022    TARGET DATE:  The 23rd of the month  Assessment: Curtis Trevino presents today for their maintenance Cabenuva  injections. Initial/past injections were tolerated well without issues. No problems with systemic effects of injections.   Administered cabotegravir  600mg /35mL in left upper outer quadrant of the gluteal muscle. Administered rilpivirine  900 mg/3mL in the right upper outer quadrant of the gluteal muscle. Monitored patient for 10 minutes after injection. Injections were tolerated well without issue. Patient will follow up in 2 months for next injection. Will check HIV RNA at next visit along with annual lipid panel.  Eligible for flu and COVID vaccines; declines all today.   Plan: - Administer Cabenuva  injections  - Next injections scheduled for 05/29/24 with Dr. Dennise and 07/24/24 with me  - Call with any issues or questions  Alan Geralds, PharmD, CPP, BCIDP, AAHIVP Clinical Pharmacist Practitioner Infectious Diseases Clinical Pharmacist [facility]      [1]  Allergies Allergen Reactions   Codeine Hives, Itching and Rash   Tylenol  [Acetaminophen ] Nausea And Vomiting    Vomiting every time he takes tylenol 

## 2024-03-27 ENCOUNTER — Ambulatory Visit: Payer: Self-pay | Admitting: Pharmacist

## 2024-03-27 ENCOUNTER — Other Ambulatory Visit: Payer: Self-pay

## 2024-03-27 DIAGNOSIS — B20 Human immunodeficiency virus [HIV] disease: Secondary | ICD-10-CM

## 2024-03-27 MED ORDER — CABOTEGRAVIR & RILPIVIRINE ER 600 & 900 MG/3ML IM SUER
1.0000 | Freq: Once | INTRAMUSCULAR | Status: AC
  Administered 2024-03-27: 10:00:00 1 via INTRAMUSCULAR

## 2024-05-16 ENCOUNTER — Other Ambulatory Visit (HOSPITAL_COMMUNITY): Payer: Self-pay

## 2024-05-29 ENCOUNTER — Encounter: Payer: Self-pay | Admitting: Internal Medicine

## 2024-07-24 ENCOUNTER — Ambulatory Visit: Payer: Self-pay | Admitting: Pharmacist
